# Patient Record
Sex: Female | Born: 1935
Health system: Southern US, Community
[De-identification: ages and names within clinical notes are randomized; demographics above are authoritative.]

## PROBLEM LIST (undated history)

## (undated) DIAGNOSIS — I10 Essential (primary) hypertension: Secondary | ICD-10-CM

## (undated) DIAGNOSIS — K589 Irritable bowel syndrome without diarrhea: Secondary | ICD-10-CM

## (undated) DIAGNOSIS — K209 Esophagitis, unspecified without bleeding: Secondary | ICD-10-CM

## (undated) DIAGNOSIS — F0390 Unspecified dementia without behavioral disturbance: Secondary | ICD-10-CM

## (undated) DIAGNOSIS — K2980 Duodenitis without bleeding: Secondary | ICD-10-CM

## (undated) DIAGNOSIS — K5790 Diverticulosis of intestine, part unspecified, without perforation or abscess without bleeding: Secondary | ICD-10-CM

## (undated) DIAGNOSIS — K219 Gastro-esophageal reflux disease without esophagitis: Secondary | ICD-10-CM

## (undated) DIAGNOSIS — I350 Nonrheumatic aortic (valve) stenosis: Secondary | ICD-10-CM

## (undated) DIAGNOSIS — C55 Malignant neoplasm of uterus, part unspecified: Secondary | ICD-10-CM

## (undated) DIAGNOSIS — R011 Cardiac murmur, unspecified: Secondary | ICD-10-CM

## (undated) HISTORY — DX: Irritable bowel syndrome, unspecified: K58.9

## (undated) HISTORY — DX: Malignant neoplasm of uterus, part unspecified: C55

## (undated) HISTORY — PX: FOOT SURGERY: SHX648

## (undated) HISTORY — DX: Esophagitis, unspecified: K20.9

## (undated) HISTORY — DX: Nonrheumatic aortic (valve) stenosis: I35.0

## (undated) HISTORY — DX: Diverticulosis of intestine, part unspecified, without perforation or abscess without bleeding: K57.90

## (undated) HISTORY — PX: VAGINAL HYSTERECTOMY: SUR661

## (undated) HISTORY — DX: Duodenitis without bleeding: K29.80

## (undated) HISTORY — DX: Esophagitis, unspecified without bleeding: K20.90

---

## 1998-04-26 ENCOUNTER — Encounter: Admission: RE | Admit: 1998-04-26 | Discharge: 1998-07-25 | Payer: Self-pay | Admitting: Family Medicine

## 2000-09-20 ENCOUNTER — Encounter: Payer: Self-pay | Admitting: Gastroenterology

## 2000-09-20 ENCOUNTER — Ambulatory Visit (HOSPITAL_COMMUNITY): Admission: RE | Admit: 2000-09-20 | Discharge: 2000-09-20 | Payer: Self-pay | Admitting: Gastroenterology

## 2001-10-19 ENCOUNTER — Encounter: Payer: Self-pay | Admitting: Family Medicine

## 2001-10-19 ENCOUNTER — Ambulatory Visit (HOSPITAL_COMMUNITY): Admission: RE | Admit: 2001-10-19 | Discharge: 2001-10-19 | Payer: Self-pay | Admitting: Family Medicine

## 2001-12-01 ENCOUNTER — Encounter: Payer: Self-pay | Admitting: Gastroenterology

## 2001-12-01 ENCOUNTER — Ambulatory Visit (HOSPITAL_COMMUNITY): Admission: RE | Admit: 2001-12-01 | Discharge: 2001-12-01 | Payer: Self-pay | Admitting: Gastroenterology

## 2004-07-04 ENCOUNTER — Other Ambulatory Visit: Admission: RE | Admit: 2004-07-04 | Discharge: 2004-07-04 | Payer: Self-pay | Admitting: Family Medicine

## 2005-05-22 ENCOUNTER — Ambulatory Visit: Payer: Self-pay | Admitting: Gastroenterology

## 2005-07-22 ENCOUNTER — Ambulatory Visit: Payer: Self-pay | Admitting: Gastroenterology

## 2005-08-01 ENCOUNTER — Ambulatory Visit: Payer: Self-pay | Admitting: Gastroenterology

## 2005-08-01 ENCOUNTER — Encounter (INDEPENDENT_AMBULATORY_CARE_PROVIDER_SITE_OTHER): Payer: Self-pay | Admitting: *Deleted

## 2005-11-19 ENCOUNTER — Ambulatory Visit: Payer: Self-pay | Admitting: Cardiology

## 2005-12-16 ENCOUNTER — Encounter: Payer: Self-pay | Admitting: Internal Medicine

## 2005-12-16 ENCOUNTER — Ambulatory Visit: Payer: Self-pay

## 2006-10-22 ENCOUNTER — Ambulatory Visit: Payer: Self-pay | Admitting: Gastroenterology

## 2006-10-22 LAB — CONVERTED CEMR LAB
ALT: 14 units/L (ref 0–40)
AST: 18 units/L (ref 0–37)
Albumin: 4 g/dL (ref 3.5–5.2)
Alkaline Phosphatase: 61 units/L (ref 39–117)
BUN: 6 mg/dL (ref 6–23)
Basophils Absolute: 0 10*3/uL (ref 0.0–0.1)
Basophils Relative: 0.1 % (ref 0.0–1.0)
Bilirubin, Direct: 0.2 mg/dL (ref 0.0–0.3)
CO2: 32 meq/L (ref 19–32)
Calcium: 9.3 mg/dL (ref 8.4–10.5)
Chloride: 98 meq/L (ref 96–112)
Creatinine, Ser: 0.7 mg/dL (ref 0.4–1.2)
Eosinophils Absolute: 0 10*3/uL (ref 0.0–0.6)
Eosinophils Relative: 0.4 % (ref 0.0–5.0)
GFR calc Af Amer: 106 mL/min
GFR calc non Af Amer: 88 mL/min
Glucose, Bld: 111 mg/dL — ABNORMAL HIGH (ref 70–99)
HCT: 38 % (ref 36.0–46.0)
Hemoglobin: 13.2 g/dL (ref 12.0–15.0)
Lymphocytes Relative: 16.8 % (ref 12.0–46.0)
MCHC: 34.7 g/dL (ref 30.0–36.0)
MCV: 87.4 fL (ref 78.0–100.0)
Monocytes Absolute: 0.4 10*3/uL (ref 0.2–0.7)
Monocytes Relative: 7.3 % (ref 3.0–11.0)
Neutro Abs: 3.6 10*3/uL (ref 1.4–7.7)
Neutrophils Relative %: 75.4 % (ref 43.0–77.0)
Platelets: 266 10*3/uL (ref 150–400)
Potassium: 3.3 meq/L — ABNORMAL LOW (ref 3.5–5.1)
RBC: 4.35 M/uL (ref 3.87–5.11)
RDW: 12.1 % (ref 11.5–14.6)
Sed Rate: 30 mm/hr — ABNORMAL HIGH (ref 0–25)
Sodium: 137 meq/L (ref 135–145)
TSH: 1.46 microintl units/mL (ref 0.35–5.50)
Total Bilirubin: 0.9 mg/dL (ref 0.3–1.2)
Total Protein: 6.8 g/dL (ref 6.0–8.3)
WBC: 4.8 10*3/uL (ref 4.5–10.5)

## 2006-10-24 ENCOUNTER — Encounter (INDEPENDENT_AMBULATORY_CARE_PROVIDER_SITE_OTHER): Payer: Self-pay | Admitting: Gastroenterology

## 2006-10-24 ENCOUNTER — Ambulatory Visit: Payer: Self-pay | Admitting: Cardiovascular Disease

## 2006-11-05 ENCOUNTER — Ambulatory Visit: Payer: Self-pay | Admitting: Gastroenterology

## 2006-11-05 LAB — CONVERTED CEMR LAB
BUN: 7 mg/dL (ref 6–23)
CO2: 31 meq/L (ref 19–32)
Calcium: 9.2 mg/dL (ref 8.4–10.5)
Chloride: 106 meq/L (ref 96–112)
Creatinine, Ser: 0.6 mg/dL (ref 0.4–1.2)
GFR calc Af Amer: 127 mL/min
GFR calc non Af Amer: 105 mL/min
Glucose, Bld: 122 mg/dL — ABNORMAL HIGH (ref 70–99)
Potassium: 3.7 meq/L (ref 3.5–5.1)
Sodium: 142 meq/L (ref 135–145)

## 2007-01-09 ENCOUNTER — Ambulatory Visit: Payer: Self-pay | Admitting: Cardiology

## 2008-06-22 ENCOUNTER — Ambulatory Visit: Payer: Self-pay | Admitting: Cardiology

## 2008-07-05 ENCOUNTER — Encounter: Payer: Self-pay | Admitting: Cardiology

## 2008-07-05 ENCOUNTER — Ambulatory Visit: Payer: Self-pay

## 2009-02-21 ENCOUNTER — Encounter: Admission: RE | Admit: 2009-02-21 | Discharge: 2009-02-21 | Payer: Self-pay | Admitting: Otolaryngology

## 2009-11-20 ENCOUNTER — Encounter (INDEPENDENT_AMBULATORY_CARE_PROVIDER_SITE_OTHER): Payer: Self-pay | Admitting: *Deleted

## 2009-11-21 ENCOUNTER — Encounter (INDEPENDENT_AMBULATORY_CARE_PROVIDER_SITE_OTHER): Payer: Self-pay | Admitting: *Deleted

## 2009-11-21 ENCOUNTER — Ambulatory Visit: Payer: Self-pay | Admitting: Gastroenterology

## 2009-11-21 DIAGNOSIS — K209 Esophagitis, unspecified without bleeding: Secondary | ICD-10-CM | POA: Insufficient documentation

## 2009-11-21 DIAGNOSIS — K625 Hemorrhage of anus and rectum: Secondary | ICD-10-CM

## 2009-11-22 LAB — CONVERTED CEMR LAB
ALT: 19 units/L (ref 0–35)
AST: 25 units/L (ref 0–37)
Albumin: 3.8 g/dL (ref 3.5–5.2)
Alkaline Phosphatase: 55 units/L (ref 39–117)
BUN: 12 mg/dL (ref 6–23)
Basophils Absolute: 0 10*3/uL (ref 0.0–0.1)
Basophils Relative: 0.4 % (ref 0.0–3.0)
CO2: 31 meq/L (ref 19–32)
Calcium: 9.2 mg/dL (ref 8.4–10.5)
Chloride: 100 meq/L (ref 96–112)
Creatinine, Ser: 0.6 mg/dL (ref 0.4–1.2)
Eosinophils Absolute: 0.1 10*3/uL (ref 0.0–0.7)
Eosinophils Relative: 0.7 % (ref 0.0–5.0)
GFR calc non Af Amer: 98.26 mL/min (ref >60)
Glucose, Bld: 99 mg/dL (ref 70–99)
HCT: 36 % (ref 36.0–46.0)
Hemoglobin: 12.2 g/dL (ref 12.0–15.0)
Lymphocytes Relative: 12.9 % (ref 12.0–46.0)
Lymphs Abs: 0.9 10*3/uL (ref 0.7–4.0)
MCHC: 33.8 g/dL (ref 30.0–36.0)
MCV: 88.1 fL (ref 78.0–100.0)
Monocytes Absolute: 0.4 10*3/uL (ref 0.1–1.0)
Monocytes Relative: 6.5 % (ref 3.0–12.0)
Neutro Abs: 5.4 10*3/uL (ref 1.4–7.7)
Neutrophils Relative %: 79.5 % — ABNORMAL HIGH (ref 43.0–77.0)
Platelets: 260 10*3/uL (ref 150.0–400.0)
Potassium: 3.7 meq/L (ref 3.5–5.1)
RBC: 4.08 M/uL (ref 3.87–5.11)
RDW: 13.5 % (ref 11.5–14.6)
Sodium: 140 meq/L (ref 135–145)
Total Bilirubin: 0.6 mg/dL (ref 0.3–1.2)
Total Protein: 6.2 g/dL (ref 6.0–8.3)
WBC: 6.8 10*3/uL (ref 4.5–10.5)

## 2009-11-23 ENCOUNTER — Telehealth: Payer: Self-pay | Admitting: Cardiology

## 2009-11-24 ENCOUNTER — Ambulatory Visit: Payer: Self-pay | Admitting: Cardiology

## 2009-11-24 DIAGNOSIS — R079 Chest pain, unspecified: Secondary | ICD-10-CM

## 2009-11-24 DIAGNOSIS — I359 Nonrheumatic aortic valve disorder, unspecified: Secondary | ICD-10-CM

## 2009-12-27 ENCOUNTER — Ambulatory Visit: Payer: Self-pay | Admitting: Gastroenterology

## 2009-12-29 ENCOUNTER — Encounter: Payer: Self-pay | Admitting: Gastroenterology

## 2009-12-29 ENCOUNTER — Telehealth: Payer: Self-pay | Admitting: Gastroenterology

## 2010-08-16 NOTE — Procedures (Signed)
Summary: Colonoscopy  Patient: Michelle [name] Note: All result statuses are Final unless otherwise noted.  Tests: (1) Colonoscopy (COL)   COL Colonoscopy           DONE     [facility]     [address]     [place], [place]  [address]           COLONOSCOPY PROCEDURE REPORT           PATIENT:  Michelle, [name]  MR#:  [id number]     BIRTHDATE:  [birth year], 75 yrs. old  GENDER:  female     ENDOSCOPIST:  Rachael Fee, MD     REF. BY:  Rudi Heap, M.D.     PROCEDURE DATE:  12/27/2009     PROCEDURE:  Colonoscopy with biopsy     ASA CLASS:  Class II     INDICATIONS:  diarrhea, intermittent rectal bleeding     MEDICATIONS:   Fentanyl 62.5 mcg IV, Versed 6 mg IV           DESCRIPTION OF PROCEDURE:   After the risks benefits and     alternatives of the procedure were thoroughly explained, informed     consent was obtained.  Digital rectal exam was performed and     revealed no rectal masses.   The LB [id number]-[id number] endoscope     was introduced through the anus and advanced to the terminal ileum     which was intubated for a short distance, without limitations.     The quality of the prep was good, using MoviPrep.  The instrument     was then slowly withdrawn as the colon was fully examined.     <<PROCEDUREIMAGES>>           FINDINGS:  Mild diverticulosis was found in the sigmoid to     descending colon segments (see image1 and image2).  The terminal     ileum appeared normal (see image6).  Internal and external     hemorrhoids were found. These were small, not thrombosed.  This     was otherwise a normal examination of the colon (see image5,     image4, and image8).   Random biopsies were taken from normal     appearing colon mucosa and sent to pathology (jar 1).  Retroflexed     views in the rectum revealed no abnormalities.    The scope was     then withdrawn from the patient and the procedure completed.           COMPLICATIONS:  None     ENDOSCOPIC IMPRESSION:  1) Mild diverticulosis in the sigmoid to descending colon     segments     2) Normal terminal ileum; no inflammtion     3) Internal and external hemorrhoids     4) Otherwise normal examination, colon was randomly biopsied to     check for microscopic colitis that can cause diarrhea           RECOMMENDATIONS:     1) Continue current colorectal screening recommendations for     "routine risk" patients with a repeat colonoscopy in 10 years.     2) Await biopsies for final recommendations.           REPEAT EXAM:  10 years           ______________________________     Rachael Fee, MD  n.     eSIGNED:   Rachael Fee at 12/27/2009 10:54 AM           Hilarie Fredrickson, [id number]  Note: An exclamation mark (!) indicates a result that was not dispersed into the flowsheet. Document Creation Date: 12/27/2009 10:55 AM _______________________________________________________________________  (1) Order result status: Final Collection or observation date-time: 12/27/2009 10:45 Requested date-time:  Receipt date-time:  Reported date-time:  Referring Physician:   Ordering Physician: Rob Bunting [phone]) Specimen Source:  Source: Launa Grill Order Number: [id number] Lab site:   Appended Document: Colonoscopy     Procedures Next Due Date:    Colonoscopy: 12/2019

## 2010-08-16 NOTE — Letter (Signed)
Summary: New Patient letter  [facility]  [address] [place], [place] [address]   Phone: [phone]  Fax: [phone]       11/20/2009 MRN: [id number]  [facility] [address] [place], [place]  [address]  Dear Ms. Dresser,  Welcome to the Gastroenterology Division at [facility].    You are scheduled to see Dr. Christella Hartigan on 11/21/2009 at 1:30PM on the 3rd floor at [facility], [address]  We ask that you try to arrive at our office 15 minutes prior to your appointment time to allow for check-in.  We would like you to complete the enclosed self-administered evaluation form prior to your visit and bring it with you on the day of your appointment.  We will review it with you.  Also, please bring a complete list of all your medications or, if you prefer, bring the medication bottles and we will list them.  Please bring your insurance card so that we may make a copy of it.  If your insurance requires a referral to see a specialist, please bring your referral form from your primary care physician.  Co-payments are due at the time of your visit and may be paid by cash, check or credit card.     Your office visit will consist of a consult with your physician (includes a physical exam), any laboratory testing he/she may order, scheduling of any necessary diagnostic testing (e.g. x-ray, ultrasound, CT-scan), and scheduling of a procedure (e.g. Endoscopy, Colonoscopy) if required.  Please allow enough time on your schedule to allow for any/all of these possibilities.    If you cannot keep your appointment, please call [phone] to cancel or reschedule prior to your appointment date.  This allows [place] the opportunity to schedule an appointment for another patient in need of care.  If you do not cancel or reschedule by 5 p.m. the business day prior to your appointment date, you will be charged a $50.00 late cancellation/no-show fee.    Thank you for choosing [facility] for your medical needs.  We appreciate the opportunity to care for you.  Please visit [place] at our website  to learn more about our practice.                     Sincerely,                                                             The Gastroenterology Division

## 2010-08-16 NOTE — Procedures (Signed)
Summary: colon   Colonoscopy  Procedure date:  08/01/2005  Findings:      Location:  [facility].   Patient Name: Michelle [name], Michelle [name]. MRN:  Procedure Procedures: Colonoscopy CPT: [phone].  Personnel: Endoscopist: Ulyess Mort, MD.  Exam Location: Exam performed in Outpatient Clinic. Outpatient  Patient Consent: Procedure, Alternatives, Risks and Benefits discussed, consent obtained, from patient. Consent was obtained by the RN.  Indications Symptoms: Abdominal pain / bloating. Change in bowel habits.  Surveillance of: Adenomatous Polyp(s).  History  Current Medications: Patient is not currently taking Coumadin.  Pre-Exam Physical: Entire physical exam was normal.  Comments: Pt. history reviewed/updated, physical exam performed prior to initiation of sedation?yes Exam Exam: Extent of exam reached: Cecum, extent intended: Cecum.  The cecum was identified by appendiceal orifice and IC valve. Colon retroflexion performed. Images taken. ASA Classification: II. Tolerance: excellent.  Monitoring: Pulse and BP monitoring, Oximetry used. Supplemental O2 given.  Colon Prep Prep results: good.  Sedation Meds: Patient assessed and found to be appropriate for moderate (conscious) sedation. Fentanyl 50 mcg. given IV. Versed 8 mg. given IV.  Findings - DIVERTICULOSIS: Descending Colon to Sigmoid Colon. ICD9: Diverticulosis: 562.10.  - NOT SEEN ON EXAM: Cecum to Rectum. Polyps, AVM's, Colitis, Tumors, Melanosis, Crohn's, Hemorrhoids, Comments: mild l colon diverticulosis.   Assessment Abnormal examination, see findings above.  Diagnoses: 562.10: Diverticulosis.   Events  Unplanned Interventions: No intervention was required.  Unplanned Events: There were no complications. Plans Medication Plan: Continue current medications.  Patient Education: Patient given standard instructions for: Diverticulosis. Yearly hemoccult testing recommended. Patient  instructed to get routine colonoscopy every 8 years.  Disposition: After procedure patient sent to recovery. After recovery patient sent home.  This report was created from the original endoscopy report, which was reviewed and signed by the above listed endoscopist.

## 2010-08-16 NOTE — Progress Notes (Signed)
Summary: req appt/sob  Phone Note Call from Patient Call back at Home Phone [phone]   Caller: Patient Reason for Call: Talk to Nurse Summary of Call: req appt asap, having trouble breathing, sometimes she has chest pain and pain when swallowing, offer 1st avaliable appt 6/28 and she refused Initial call taken by: Migdalia Dk,  Nov 23, 2009 11:45 AM  Follow-up for Phone Call        having quick sharp pains to the left of center of chest under her breast.  also c/o SOB - like it's hard to get her breathe.  Requesting to be seen before 6/28 (next available)  Pt added on to schedule for 5/13 but told to go to ED for eval if pain reoccurs.  Pt states understanding. Follow-up by: Charolotte Capuchin, RN,  Nov 23, 2009 5:47 PM

## 2010-08-16 NOTE — Procedures (Signed)
Summary: Upper Endoscopy  Patient: [name] Note: All result statuses are Final unless otherwise noted.  Tests: (1) Upper Endoscopy (EGD)   EGD Upper Endoscopy       DONE     [facility]     [address]     [place], [place]  [address]           ENDOSCOPY PROCEDURE REPORT     PATIENT:  Michelle, [name]  MR#:  [id number]     BIRTHDATE:  [birth year], 75 yrs. old  GENDER:  female     ENDOSCOPIST:  Rachael Fee, MD     PROCEDURE DATE:  12/27/2009     PROCEDURE:  EGD with biopsy     ASA CLASS:  Class II     INDICATIONS:  dyspepsia     MEDICATIONS:  There was residual sedation effect present from     prior procedure., Fentanyl 12.5 mcg IV, Versed 1 mg IV     TOPICAL ANESTHETIC:  Exactacain Spray     DESCRIPTION OF PROCEDURE:   After the risks benefits and     alternatives of the procedure were thoroughly explained, informed     consent was obtained.  The [facility] GIF-H180 [id number] endoscope was     introduced through the mouth and advanced to the second portion of     the duodenum, without limitations.  The instrument was slowly     withdrawn as the mucosa was fully examined.     <<PROCEDUREIMAGES>>     There was moderate, non-specific pangastritis. This was biopsied     to check for H. pylori (see image2, image5, and image6) and sent     to pathology (jar 2).  Otherwise the examination was normal (see     image4, image3, and image1).    Retroflexed views revealed no     abnormalities.    The scope was then withdrawn from the patient     and the procedure completed.     COMPLICATIONS:  None     ENDOSCOPIC IMPRESSION:     1) Moderate pan-gastritis; biopsied to check for H. pylori     2) Otherwise normal examination           RECOMMENDATIONS:     If biopsies show H. pylori, she will be started on the     appropriate antibiotics.  If not, will change PPI (protonix) to     twice daily.           ______________________________     Rachael Fee, MD           cc: Rudi Heap, MD           n.     Rosalie Doctor:   Rachael Fee at 12/27/2009 11:00 AM           Hilarie Fredrickson, [id number]  Note: An exclamation mark (!) indicates a result that was not dispersed into the flowsheet. Document Creation Date: 12/27/2009 11:01 AM _______________________________________________________________________  (1) Order result status: Final Collection or observation date-time: 12/27/2009 10:52 Requested date-time:  Receipt date-time:  Reported date-time:  Referring Physician:   Ordering Physician: Rob Bunting [phone]) Specimen Source:  Source: Launa Grill Order Number: [id number] Lab site:

## 2010-08-16 NOTE — Letter (Signed)
Summary: Results Letter  [facility]  [address] [place], [place] [address]   Phone: [phone]  Fax: [phone]        December 29, 2009 MRN: [id number]    [facility] [address] [place], [place]  [address]    Dear Ms. Horsfall,   You should continue to follow current colorectal cancer screening guidelines with a repeat colonoscopy in 10 years.  We will therefore put your information in our reminder system and will contact you in 10 years to schedule a repeat procedure.    The biopsies taken during the upper endoscopy showed no sign of infection or cancer.  Please increase your antiacid medicine to twice daily (prilosec should be twice daily).  Call my office if you need a new prescription for this to get it twice daily.  If diarrhea is still bothersome, start taking one immodium pill every morning shortly after you wake up and only stop if you become constipated.  Please feel free to call if you have any further questions or concerns.      Sincerely,  Rachael Fee MD  This letter has been electronically signed by your physician.  Appended Document: Results Letter letter mailed.

## 2010-08-16 NOTE — Progress Notes (Signed)
Summary: triage  Phone Note Call from Patient Call back at Home Phone [phone]   Caller: Patient Call For: Christella Hartigan Reason for Call: Talk to Nurse Summary of Call: pt wants to know her bx results and if she does not have a bacteria can she change to protonix. Initial call taken by: Tawni Levy,  December 29, 2009 1:20 PM  Follow-up for Phone Call        per egd report is no h pylori Dr. Christella Hartigan says the pt can change PPIS to protonix two times a day I advised her I will send her a new rx for her to change from prilosec to protonix two times a day  Follow-up by: Harlow Mares CMA (AAMA),  December 29, 2009 2:18 PM    New/Updated Medications: PANTOPRAZOLE SODIUM 40 MG TBEC (PANTOPRAZOLE SODIUM) take one by mouth 30 min before breakfast and one by mouth 30 min before supper. Prescriptions: PANTOPRAZOLE SODIUM 40 MG TBEC (PANTOPRAZOLE SODIUM) take one by mouth 30 min before breakfast and one by mouth 30 min before supper.  #60 x 6   Entered by:   Harlow Mares CMA (AAMA)   Authorized by:   Rachael Fee MD   Signed by:   Harlow Mares CMA (AAMA) on 12/29/2009   Method used:   Electronically to        [facility]  [address] (retail)       [address]       [place], [place]  [address]       Ph: [phone]       Fax: [phone]   RxID:   [id number]

## 2010-08-16 NOTE — Assessment & Plan Note (Signed)
History of Present Illness Visit Type: Follow-up Visit Primary GI MD: Rob Bunting MD Primary Provider: Rudi Heap MD Chief Complaint: diarrhea, hemorhoids, rectal bleeding History of Present Illness:     pleasant 77 year old woman who was previously seen by Dr. Doreatha Martin, last colonoscopy and upper endoscopy was in 2007.   Recently had diarrhrea, lasted for about 3 weeks.  NOrmally she is constipated.  She usually eats alot of fiber, however modified her diet to avoid fiber.  Then became constipated.  She had bleeding rectally 2 days ago ("gobs of blood").  Then took a stool softner.   She has never had dirrhea like this before.  She has lost weight lately, 30 pounds over 3 years.  No sick contacts.  Takes mobic daily.  Does not take PPI daily.  Stopped because it was too expensive.  Was recommended to take omeprazole.  previously Dr. Corinda Gubler documented that she had adenomatous colon polyps however I find no pathologic evidence for this. The only polyp pathology that I see was 15 years ago and it was hyperplastic.  She has had dyspepsia, indigestion.           Current Medications (verified): 1)  Protonix 40 Mg Tbec (Pantoprazole Sodium) .Marland Kitchen.. 1 By Mouth Once Daily 2)  Altace 10 Mg Caps (Ramipril) .Marland Kitchen.. 1 By Mouth Once Daily 3)  Hydrochlorothiazide 25 Mg Tabs (Hydrochlorothiazide) .Marland Kitchen.. 1 By Mouth Once Daily 4)  Vytorin 10-20 Mg Tabs (Ezetimibe-Simvastatin) .Marland Kitchen.. 1 By Mouth Once Daily 5)  Vitamin D (Ergocalciferol) 50000 Unit Caps (Ergocalciferol) .Marland Kitchen.. 1 Per Week 6)  Zyrtec Hives Relief 10 Mg Tabs (Cetirizine Hcl) .Marland Kitchen.. 1 By Mouth Once Daily 7)  Mobic 7.5 Mg Tabs (Meloxicam) .... As Needed 8)  Flonase 50 Mcg/act Susp (Fluticasone Propionate) .... As Needed 9)  Metamucil 30.9 % Powd (Psyllium) .... As Needed 10)  Benefiber  Powd (Wheat Dextrin) .... As Directed 11)  Colace 100 Mg Caps (Docusate Sodium) .... As Needed  Allergies (verified): 1)  ! Sulfa 2)  ! Macrobid  Past  History:  Past Medical History: Current Problems:  DUODENITIS WITHOUT MENTION OF HEMORRHAGE (ICD-535.60) ESOPHAGITIS (ICD-530.10) Diverticulosis heart murmur mild arotic stenosis Urinary Tract Infection Irritable Bowel Syndrome uterine cancer   Past Surgical History: Hysterectomy    Family History: no colon cancer  Social History: She is married, she has 3 children, she is retired, she does not smoke cigarettes or drink alcohol  Review of Systems       Pertinent positive and negative review of systems were noted in the above HPI and GI specific review of systems.  All other review of systems was otherwise negative.   Vital Signs:  Patient profile:   77 year old female Height:      67 inches Weight:      179 pounds BMI:     28.14 Pulse rate:   72 / minute Pulse rhythm:   regular BP sitting:   146 / 62  (left arm)  Vitals Entered By: Chales Abrahams CMA Duncan Dull) (Nov 21, 2009 1:13 PM)  Physical Exam  Additional Exam:  Constitutional: generally well appearing Psychiatric: alert and oriented times 3 Eyes: extraocular movements intact Mouth: oropharynx moist, no lesions Neck: supple, no lymphadenopathy Cardiovascular: heart regular rate and rythm Lungs: CTA bilaterally Abdomen: soft, non-tender, non-distended, no obvious ascites, no peritoneal signs, normal bowel sounds Extremities: no lower extremity edema bilaterally Skin: no lesions on visible extremities rectal exam: with female assistant in room, small to medium sized  external hemorrhoids, no internal masses palpated, no blood on finger.   Impression & Recommendations:  Problem # 1:  recent change in her bowel habits, history of polyps that we should proceed with repeat colonoscopy. She does have some hemorrhoids that appear swollen and probably a source of her recent bleeding. I have given her prescription for Analpram ointment which she can apply once daily as needed.  Problem # 2:  dyspepsia, indigestion EGD  at the same time as her colonoscopy  Other Orders: TLB-CBC Platelet - w/Differential (85025-CBCD) TLB-CMP (Comprehensive Metabolic Pnl) (80053-COMP)  Patient Instructions: 1)  You will be scheduled to have a colonoscopy and EGD. 2)  A copy of this information will be sent to Dr. Christell Constant. 3)  You will get lab test(s) done today (cbc, cmet) 4)  Analpram prescription called in. Prescriptions: ANALPRAM-HC 1-1 %  CREA (HYDROCORTISONE ACE-PRAMOXINE) apply to anus once daily as needed  #30 day x 3   Entered by:   Chales Abrahams CMA (AAMA)   Authorized by:   Rachael Fee MD   Signed by:   Chales Abrahams CMA (AAMA) on 11/21/2009   Method used:   Print then Give to Patient   RxID:   [id number]   Appended Document: Orders Update/movi    Clinical Lists Changes  Medications: Added new medication of MOVIPREP 100 GM  SOLR (PEG-KCL-NACL-NASULF-NA ASC-C) As per prep instructions. - Signed Rx of MOVIPREP 100 GM  SOLR (PEG-KCL-NACL-NASULF-NA ASC-C) As per prep instructions.;  #1 x 0;  Signed;  Entered by: Chales Abrahams CMA (AAMA);  Authorized by: Rachael Fee MD;  Method used: Print then Give to Patient Orders: Added new Test order of Colon/Endo (Colon/Endo) - Signed    Prescriptions: MOVIPREP 100 GM  SOLR (PEG-KCL-NACL-NASULF-NA ASC-C) As per prep instructions.  #1 x 0   Entered by:   Chales Abrahams CMA (AAMA)   Authorized by:   Rachael Fee MD   Signed by:   Chales Abrahams CMA (AAMA) on 11/21/2009   Method used:   Print then Give to Patient   RxID:   [id number]

## 2010-08-16 NOTE — Letter (Signed)
Summary: [facility] Instructions  [facility]  [address] [place], [place] [address]   Phone: [phone]  Fax: [phone]       Michelle [name]    09-10-1933    MRN: [id number]        Procedure Day /Date:12/05/09 TUE     Arrival Time:1 pm     Procedure Time:2 pm     Location of Procedure:                    X  [facility] (4th Floor)                        PREPARATION FOR COLONOSCOPY WITH MOVIPREP   Starting 5 days prior to your procedure 11/30/09 do not eat nuts, seeds, popcorn, corn, beans, peas,  salads, or any raw vegetables.  Do not take any fiber supplements (e.g. Metamucil, Citrucel, and Benefiber).  THE DAY BEFORE YOUR PROCEDURE         DATE: 12/04/09 DAY: MON  1.  Drink clear liquids the entire day-NO SOLID FOOD  2.  Do not drink anything colored red or purple.  Avoid juices with pulp.  No orange juice.  3.  Drink at least 64 oz. (8 glasses) of fluid/clear liquids during the day to prevent dehydration and help the prep work efficiently.  CLEAR LIQUIDS INCLUDE: Water Jello Ice Popsicles Tea (sugar ok, no milk/cream) Powdered fruit flavored drinks Coffee (sugar ok, no milk/cream) Gatorade Juice: apple, white grape, white cranberry  Lemonade Clear bullion, consomm, broth Carbonated beverages (any kind) Strained chicken noodle soup Hard Candy                             4.  In the morning, mix first dose of MoviPrep solution:    Empty 1 Pouch A and 1 Pouch B into the disposable container    Add lukewarm drinking water to the top line of the container. Mix to dissolve    Refrigerate (mixed solution should be used within 24 hrs)  5.  Begin drinking the prep at 5:00 p.m. The MoviPrep container is divided by 4 marks.   Every 15 minutes drink the solution down to the next mark (approximately 8 oz) until the full liter is complete.   6.  Follow completed prep with 16 oz of clear liquid of your choice (Nothing red or purple).   Continue to drink clear liquids until bedtime.  7.  Before going to bed, mix second dose of MoviPrep solution:    Empty 1 Pouch A and 1 Pouch B into the disposable container    Add lukewarm drinking water to the top line of the container. Mix to dissolve    Refrigerate  THE DAY OF YOUR PROCEDURE      DATE:12/05/09 DAY: TUE  Beginning at 9 a.m. (5 hours before procedure):         1. Every 15 minutes, drink the solution down to the next mark (approx 8 oz) until the full liter is complete.  2. Follow completed prep with 16 oz. of clear liquid of your choice.    3. You may drink clear liquids until 12 noon (2 HOURS BEFORE PROCEDURE).   MEDICATION INSTRUCTIONS  Unless otherwise instructed, you should take regular prescription medications with a small sip of water   as early as possible the morning of your procedure.  OTHER INSTRUCTIONS  You will need a responsible adult at least 75 years of age to accompany you and drive you home.   This person must remain in the waiting room during your procedure.  Wear loose fitting clothing that is easily removed.  Leave jewelry and other valuables at home.  However, you may wish to bring a book to read or  an iPod/MP3 player to listen to music as you wait for your procedure to start.  Remove all body piercing jewelry and leave at home.  Total time from sign-in until discharge is approximately 2-3 hours.  You should go home directly after your procedure and rest.  You can resume normal activities the  day after your procedure.  The day of your procedure you should not:   Drive   Make legal decisions   Operate machinery   Drink alcohol   Return to work  You will receive specific instructions about eating, activities and medications before you leave.    The above instructions have been reviewed and explained to me by   _______________________    I fully understand and can verbalize these instructions  _____________________________ Date _________

## 2010-08-16 NOTE — Assessment & Plan Note (Signed)
Summary: chest pain/SOB appt at 2  pfh,rn   Visit Type:  Follow-up Primary Provider:  Rudi Heap MD  CC:  chest pain.  History of Present Illness: The patient presents for evaluation of her mild aortic stenosis. She has multiple complaints including loss of GI disturbance with borborygmi, flatus and abdominal discomfort. She does get discomfort in her upper epigastric area that goes under her left breast. She does not describe any sternal discomfort neck or jaw discomfort. It happens at rest. She doesn't sound like she is overly active but when she does exert herself she does not report bringing this on. She denies resting shortness of breath, PND or orthopnea. She sometimes feels like she has trouble "taking a deep breath". She's had no weight gain or swelling. Her review of systems is positive as stated below.  Current Medications (verified): 1)  Prilosec 20 Mg Cpdr (Omeprazole) .Marland Kitchen.. 1` By Mouth Daily 2)  Altace 10 Mg Caps (Ramipril) .Marland Kitchen.. 1 By Mouth Once Daily 3)  Hydrochlorothiazide 25 Mg Tabs (Hydrochlorothiazide) .Marland Kitchen.. 1 By Mouth Once Daily 4)  Vytorin 10-20 Mg Tabs (Ezetimibe-Simvastatin) .Marland Kitchen.. 1 By Mouth Once Daily 5)  Vitamin D (Ergocalciferol) 50000 Unit Caps (Ergocalciferol) .Marland Kitchen.. 1 Per Week 6)  Zyrtec Hives Relief 10 Mg Tabs (Cetirizine Hcl) .Marland Kitchen.. 1 By Mouth Once Daily 7)  Mobic 7.5 Mg Tabs (Meloxicam) .... As Needed 8)  Flonase 50 Mcg/act Susp (Fluticasone Propionate) .... As Needed 9)  Metamucil 30.9 % Powd (Psyllium) .... As Needed 10)  Benefiber  Powd (Wheat Dextrin) .... As Directed 11)  Colace 100 Mg Caps (Docusate Sodium) .... As Needed 12)  Analpram-Hc 1-1 %  Crea (Hydrocortisone Ace-Pramoxine) .... Apply To Anus Once Daily As Needed 13)  Moviprep 100 Gm  Solr (Peg-Kcl-Nacl-Nasulf-Na Asc-C) .... As Per Prep Instructions. 14)  Align  Caps (Probiotic Product) .Marland Kitchen.. 1 By Mouth Daily  Allergies (verified): 1)  ! Sulfa 2)  ! Macrobid  Past History:  Past Medical  History: DUODENITIS WITHOUT MENTION OF HEMORRHAGE (ICD-535.60) ESOPHAGITIS (ICD-530.10) Diverticulosis Mild arotic stenosis Urinary Tract Infection Irritable Bowel Syndrome Uterine cancer   Review of Systems       Difficulty sleeping, diarrhea, left leg pain, dizziness, drooling, sense of smell and burn leaves, weakness. Otherwise as stated in the history of present illness negative for other systems.  Vital Signs:  Patient profile:   77 year old female Height:      67 inches Weight:      175 pounds BMI:     27.51 Pulse rate:   78 / minute Resp:     16 per minute BP sitting:   142 / 74  (right arm)  Vitals Entered By: Marrion Coy, CNA (Nov 24, 2009 1:46 PM)  Physical Exam  General:  Well developed, well nourished, in no acute distress. Head:  normocephalic and atraumatic Eyes:  PERRLA/EOM intact; conjunctiva and lids normal. Mouth:  Teeth, gums and palate normal. Oral mucosa normal. Neck:  Neck supple, no JVD. No masses, thyromegaly or abnormal cervical nodes. Chest Wall:  no deformities or breast masses noted Lungs:  Clear bilaterally to auscultation and percussion. Abdomen:  Bowel sounds positive; abdomen soft and non-tender without masses, organomegaly, or hernias noted. No hepatosplenomegaly. Msk:  Back normal, normal gait. Muscle strength and tone normal. Extremities:  No clubbing or cyanosis. Neurologic:  Alert and oriented x 3. Skin:  Intact without lesions or rashes. Cervical Nodes:  no significant adenopathy Axillary Nodes:  no significant adenopathy Inguinal Nodes:  no significant adenopathy Psych:  Normal affect.   Detailed Cardiovascular Exam  Neck    Carotids: Carotids full and equal bilaterally without bruits.      Neck Veins: Normal, no JVD.    Heart    Inspection: no deformities or lifts noted.      Palpation: normal PMI with no thrills palpable.      Auscultation: regular rate and rhythm, S1, S2 without murmurs, rubs, gallops, or clicks.     Vascular    Abdominal Aorta: no palpable masses, pulsations, or audible bruits.      Femoral Pulses: normal femoral pulses bilaterally.      Pedal Pulses: normal pedal pulses bilaterally.      Radial Pulses: normal radial pulses bilaterally.      Peripheral Circulation: no clubbing, cyanosis, or edema noted with normal capillary refill.     EKG  Procedure date:  11/24/2009  Findings:      sinus rhythm, rate 78, left axis deviation, poor anterior R-wave progression, no acute ST-T wave changes.  Impression & Recommendations:  Problem # 1:  AORTIC VALVE DISORDERS (ICD-424.1) Her aortic stenosis is mild. I would not suggest that it has changed by clinical exam or contributing to symptoms. No change in therapy is evaluated and no further echoes though I will most likely repeat one in one year.  Problem # 2:  CHEST PAIN (ICD-786.50) This is somewhat atypical. However, final treadmill testing is indicated. She wants to defer this until her husband has knee surgery. She will call back to let us know when and where she wants to have this done.  Problem # 3:  DIARRHEA (ICD-787.91) I suspect most of her problems her GI. She is following up with Dr. Christella Hartigan.  Patient Instructions: 1)  Your physician recommends that you schedule a follow-up appointment in: 12 months 2)  Your physician has requested that you have an exercise tolerance test.  For further information please visit [email or web address]  Please also follow instruction sheet, as given. Call us when you want to schedule  Appended Document: chest pain/SOB appt at 2  pfh,rn 2/6 apical systolic murmur heard radiating at the aortic outflow tract and early peaking  Appended Document: chest pain/SOB appt at 2  pfh,rn Per Dr. Antoine Poche pt needs order for Ambien 5 mg by mouth as needed for sleep #30. No refills. Pt would like this called to [facility] [phone]. I called pharmacy but it is closed. I spoke with pt and told her I would  call prescription in on Monday AM.   Appended Document: chest pain/SOB appt at 2  pfh,rn prescription called to pharmacy

## 2010-08-16 NOTE — Procedures (Signed)
Summary: EGD   EGD  Procedure date:  08/01/2005  Findings:      Location: [facility]   Patient Name: Michelle [name], Michelle [name]. MRN:  Procedure Procedures: Panendoscopy (EGD) CPT: 43235.  Personnel: Endoscopist: Ulyess Mort, MD.  Exam Location: Exam performed in Outpatient Clinic. Outpatient  Patient Consent: Procedure, Alternatives, Risks and Benefits discussed, consent obtained, from patient. Consent was obtained by the RN.  Indications Symptoms: Dysphagia. Dyspepsia, Reflux symptoms  History  Current Medications: Patient is not currently taking Coumadin.  Pre-Exam Physical: Entire physical exam was normal.  Comments: Pt. history reviewed/updated, physical exam performed prior to initiation of sedation? Exam Exam Info: Maximum depth of insertion Duodenum, intended Duodenum. Patient position: on left side. Vocal cords visualized. Gastric retroflexion performed. Images taken. ASA Classification: II. Tolerance: good.  Sedation Meds: Patient assessed and found to be appropriate for moderate (conscious) sedation. Fentanyl given IV. Versed given IV. Cetacaine Spray given aerosolized.  Monitoring: BP and pulse monitoring done. Oximetry used. Supplemental O2 given  Findings - ESOPHAGEAL INFLAMMATION: suspected as a result of reflux. Severity is mild, erythema only.  Edema present. ICD9: Esophagitis: 530.10. Comments: no sign. hernia seen.  - MUCOSAL ABNORMALITY: Duodenal Bulb to Jejunum. Granular mucosa. Biopsy/Mucosal Abn taken. ICD9: Duodenitis without Hemorrhage: 535. 60.  - MUCOSAL ABNORMALITY: Body to Antrum. Erythematous mucosa. Granular mucosa. RUT done, results pending.   Assessment Abnormal examination, see findings above.  Diagnoses: 530.10: Esophagitis.  535.60: Duodenitis without Hemorrhage.   Events  Unplanned Intervention: No unplanned interventions were required.  Unplanned Events: There were no  complications. Plans Medication(s): Await pathology. Continue current medications. PPI:  Promotility:   Patient Education: Patient given standard instructions for: Hiatal Hernia. Mucosal Abnormality.  Disposition: After procedure patient sent to recovery. After recovery patient sent home.  This report was created from the original endoscopy report, which was reviewed and signed by the above listed endoscopist.

## 2010-09-28 ENCOUNTER — Other Ambulatory Visit: Payer: Self-pay | Admitting: Family Medicine

## 2010-09-28 DIAGNOSIS — R1013 Epigastric pain: Secondary | ICD-10-CM

## 2010-10-02 ENCOUNTER — Other Ambulatory Visit (HOSPITAL_COMMUNITY): Payer: Self-pay

## 2010-10-02 ENCOUNTER — Ambulatory Visit (HOSPITAL_COMMUNITY)
Admission: RE | Admit: 2010-10-02 | Discharge: 2010-10-02 | Disposition: A | Discharge status: Home / Self Care | Payer: Medicare Other | Source: Ambulatory Visit | Attending: Family Medicine | Admitting: Family Medicine

## 2010-10-02 ENCOUNTER — Other Ambulatory Visit: Payer: Self-pay | Admitting: Family Medicine

## 2010-10-02 DIAGNOSIS — R109 Unspecified abdominal pain: Secondary | ICD-10-CM | POA: Insufficient documentation

## 2010-10-02 DIAGNOSIS — K5730 Diverticulosis of large intestine without perforation or abscess without bleeding: Secondary | ICD-10-CM | POA: Insufficient documentation

## 2010-10-02 DIAGNOSIS — R1013 Epigastric pain: Secondary | ICD-10-CM | POA: Insufficient documentation

## 2010-10-02 MED ORDER — IOHEXOL 300 MG/ML  SOLN: 100.0000 mL | Freq: Once | INTRAMUSCULAR | Status: AC | PRN

## 2010-10-02 MED ORDER — IOHEXOL 300 MG/ML  SOLN
100.0000 mL | Freq: Once | INTRAMUSCULAR | Status: AC | PRN
  Administered 2010-10-02: 100 mL via INTRAVENOUS

## 2010-10-11 ENCOUNTER — Telehealth: Payer: Self-pay | Admitting: Gastroenterology

## 2010-10-11 NOTE — Telephone Encounter (Signed)
Pt has been having diarrhea, and  pain in the upper abd and a cramping pain in the lower abd  since stopping prednisone on 09/19/10. She saw Paulene Floor NP on 09/26/10,  had CT scan on 10/02/10 that showed diverticulosis otherwise normal.  She was put on Dexilant by Paulene Floor on 09/26/10 and says it has not helped.  She states she gets weak and nausea after eating and then has diarrhea shortly after.  Pt wants to be seen ASAP I advised the pt that Dr Christella Hartigan has no openings until 11/20/10.  I will send this to Dr Christella Hartigan for review.

## 2010-10-12 ENCOUNTER — Telehealth: Payer: Self-pay | Admitting: Gastroenterology

## 2010-10-12 NOTE — Telephone Encounter (Signed)
Similar complaints to one year ago, colo/egd done at that point.  She should begin taking immodium one pill twice daily on a scheduled basis and rov at scheduled time

## 2010-10-12 NOTE — Telephone Encounter (Signed)
Pt advised to avoid anything that causes her discomfort. Pt agrees

## 2010-10-12 NOTE — Telephone Encounter (Signed)
Pt aware of Dr Christella Hartigan recommendations and ROV scheduled.  She will call if she worsens or symptoms change.

## 2010-11-20 ENCOUNTER — Ambulatory Visit (INDEPENDENT_AMBULATORY_CARE_PROVIDER_SITE_OTHER): Payer: Medicare Other | Admitting: Gastroenterology

## 2010-11-20 ENCOUNTER — Other Ambulatory Visit: Payer: Medicare Other

## 2010-11-20 ENCOUNTER — Telehealth: Payer: Self-pay | Admitting: Gastroenterology

## 2010-11-20 ENCOUNTER — Encounter: Payer: Self-pay | Admitting: Gastroenterology

## 2010-11-20 DIAGNOSIS — R1013 Epigastric pain: Secondary | ICD-10-CM

## 2010-11-20 DIAGNOSIS — K3189 Other diseases of stomach and duodenum: Secondary | ICD-10-CM

## 2010-11-20 DIAGNOSIS — K5900 Constipation, unspecified: Secondary | ICD-10-CM

## 2010-11-20 NOTE — Patient Instructions (Signed)
You will have labs checked today in the basement lab.  Please head down after you check out with the front desk  (stool studies for c. Difficile) You will be set up for a gastric emptying scan to see if your stomach empties normally. A copy of this information will be made available to Dr. Vernon Prey.

## 2010-11-20 NOTE — Progress Notes (Signed)
Review of pertinent gastrointestinal problems: 1. Routine risk for colon cancer; colonoscopy 2011 (Dr. Christella Hartigan) found no polyps, next screening colonoscopy at 10 year interval 2. Loose stools: colonoscopy, look in terminal ileum 2011 found no cause, random colon biopsies showed no microscopic colitis 3. Moderate, pan-gastritis on EGD 2011, biopsies showed no H. Pylori  HPI: This is a pleasant 77 year old woman whom I last saw about a year ago at the time of upper and lower endoscopies.  Was put on protonix after EGD last year.  Started having diarrhea 2-3 months ago, loose stools 3-4 times a day.  She was on anitiobitcs in Jan, February for ear problems.  She had problems with diarrhea for about 2-3 months, she called here and we advised her to start immodium one pill twice a day.  She has been constipated for 2 weeks.  Has had to strain a lot.  She stopped immodium about 2 weeks ago.  She takes benefiber in AM, metamucil in evening (has been her pattern for years). Will occasionally take colace.  She drinks a lot of fluids.  She was ordered a CT scan for the diarreha and abd pains in March, this showed no clear cause, +diverticulosis.  Was not given any stool testing.  She is weak, was started on B12 shots (tells me the level was low).     Physical Exam: BP 124/72  Pulse 64  Ht 5\' 8"  (1.727 m)  Wt 159 lb (72.122 kg)  BMI 24.18 kg/m2 Constitutional: generally well-appearing Psychiatric: alert and oriented x3 Abdomen: soft, nontender, nondistended, no obvious ascites, no peritoneal signs, normal bowel sounds    Assessment and plan: 77 y.o. female with recent diarrheal illness, for the past 2 weeks has been constipated  Her loose, diarrheal illness started about a month after being on antibiotics for ear problems. She might have had Clostridium difficile. She had a CT scan at some points during workup and this was essentially normal except for showing diverticulosis without obvious  infection. She called here and we recommended Imodium. She has been constipated for the past 2 weeks. Actually seems like she alternates between constipation and loose stools quite frequently. She is on fiber supplements which I recommend she continue. She is certain that her stomach empties poorly and her digestion is slow and is insisting upon symptom of the test to measure her gastric function. I have set her up with a gastric emptying scan and we will check her stool for Clostridium difficile.

## 2010-11-21 ENCOUNTER — Other Ambulatory Visit: Payer: Medicare Other

## 2010-11-21 DIAGNOSIS — R1013 Epigastric pain: Secondary | ICD-10-CM

## 2010-11-21 NOTE — Telephone Encounter (Signed)
Left message on machine to call back  

## 2010-11-22 LAB — CLOSTRIDIUM DIFFICILE BY PCR: Toxigenic C. Difficile by PCR: DETECTED — CR

## 2010-11-23 ENCOUNTER — Telehealth: Payer: Self-pay | Admitting: *Deleted

## 2010-11-23 NOTE — Telephone Encounter (Signed)
LMOM for pt to call back for + CDIFF instructions.

## 2010-11-23 NOTE — Telephone Encounter (Signed)
Message copied by Graciella Freer on Fri Nov 23, 2010  1:53 PM ------      Message from: Rob Bunting      Created: Fri Nov 23, 2010  1:04 PM                   Please call the patient.  The stool tests were + for c. Difficile.  She needs to be started on flagyl 250mg  po tid for 2 weeks, no refills, she should call 2 weeks after completing the antibiotics to report on her symptoms.

## 2010-11-26 MED ORDER — METRONIDAZOLE 250 MG PO TABS: 250.0000 mg | ORAL_TABLET | Freq: Three times a day (TID) | ORAL | Status: AC

## 2010-11-26 NOTE — Telephone Encounter (Signed)
Notified pt of C. Diff results and that she needs to be on Flagyl x 2 weeks. Pt requests that I order the drug at [facility].

## 2010-11-27 NOTE — Assessment & Plan Note (Signed)
[facility]                            CARDIOLOGY OFFICE NOTE   NAME:[name] [name] REINERTSEN                       MRN:          [id number]  DATE:01/09/2007                            DOB:          03-Jun-1934    PRIMARY CARE PHYSICIAN:  [name] [name], M.D.   REASON FOR PRESENTATION:  Evaluate patient with aortic stenosis.   HISTORY OF PRESENT ILLNESS:  Patient is a pleasant, 77 year old, whom I  saw last year.  She has a heart murmur and was found to have mild aortic  stenosis.  There is some basal septal hypertrophy, as well.  The  stenosis is mild.  She has well-preserved ejection fraction.   She returns for yearly followup.  She has had a rough year with urinary  tract infection and some bowel problems and thrush with antibiotics.  She has just started to recover from this and has started walking.  With  her level of activity, she denies any chest discomfort, neck discomfort,  arm discomfort, activity-induced nausea or vomiting, excessive  diaphoresis.  She has had no palpitations, presyncope or syncope.  She  has had no shortness of breath.   PAST MEDICAL HISTORY:  Mild aortic stenosis, chronic urinary tract  infections, diverticulitis, irritable bowel syndrome, esophagitis,  hysterectomy for apparent uterine cancer in 1973.   ALLERGIES:  FLOXIN, MACROBID, QUESTIONABLY SULFA.   MEDICATIONS:  1. Protonix 40 mg b.i.d.  2. Altace 10 mg daily.  3. Hydrochlorothiazide 25 mg daily.  4. Vytorin 10/20 daily.  5. Metamucil.  6. Benefiber.  7. Librax.  8. Xyzal 5 mg.  9. Stool softener.   REVIEW OF SYSTEMS:  Negative for other systems.   PHYSICAL EXAMINATION:  The patient is in no distress.  Blood pressure  150/84, heart rate 80 and regular.  NECK:  No jugular venous distention, wave form within normal limits.  Carotid upstroke brisk and symmetric.  No bruits, no thyromegaly.  LYMPHATICS:  No adenopathy.  LUNGS:  Clear to auscultation  bilaterally.  BACK:  No costovertebral angle tenderness.  CHEST:  Unremarkable.  HEART:  PMI not displaced or sustained.  S1 and S2 within normal limits.  No S3, no S4, 1/6 apical systolic murmur, early peaking, radiating out  the aortic outflow tract and best heard in the left lateral decubitus  position, no diastolic murmurs.  ABDOMEN:  Flat, positive bowel sounds, normal in frequency and pitch.  No bruits, no rebound, no guarding, no midline pulsatile mass, no  organomegaly.  SKIN:  No rashes, no nodules.  EXTREMITIES:  Two-plus pulses, no edema, no cyanosis, no clubbing.  NEUROLOGIC:  Oriented to person, place and time.  Cranial nerves II  through XII grossly intact.  Motor grossly intact.   EKG:  Sinus rhythm, rate 76, axis leftward, intervals within normal  limits, no acute ST-wave changes.   ASSESSMENT AND PLAN:  1. Aortic stenosis:  The patient's murmur is even softer than I      appreciated before.  At this point, I would not suggest that she      needs any further cardiovascular  testing.  Rather, she can come      back in a couple of years and we can do another physical exam.  She      will come back if she has any increasing shortness of breath,      dyspnea or chest pain.  2. Hypertension:  The patient's blood pressure is slightly elevated.      This is unusual.  She will keep an eye on this and continue the      medications as listed.  3. Followup:  I will see her back as above.     Rollene Rotunda, MD, [facility]  Electronically Signed    JH/MedQ  DD: 01/09/2007  DT: 01/09/2007  Job #: [id number]   cc:   [name] [name], M.D.

## 2010-11-27 NOTE — Assessment & Plan Note (Signed)
[facility]                            CARDIOLOGY OFFICE NOTE   NAME:Michelle [name], [name]                       MRN:          [id number]  DATE:06/22/2008                            DOB:          03-05-1934    PRIMARY CARE PHYSICIAN:  Bennie Pierini, NP   REASON FOR PRESENTATION:  Evaluate the patient with dyspnea and chest  discomfort.   HISTORY OF PRESENT ILLNESS:  The patient is now 77 years old.  I saw her  last in 2007.  At that time, she had a heart murmur.  The murmur was  found to be with some mild aortic stenosis.  She is now being referred  back because of multiple complaints.  Among these is dyspnea.  She  states that at times, she feels like she cannot get a deep breath.  She  has to physically strive to breathe deeply to overcome this.  It may  last for few minutes.  This happens sporadically.  She is not describing  PND or orthopnea.  She can do activities like climbing flight of stairs  without bring this on.  She says sometimes it hurts to take a deep  breath or move in certain direction.  This is a sharp discomfort under  her left breast.  She has multiple other somatic complaints including  fatigue and pain between her shoulder blades.  This discomfort is  sporadic and not associated with other symptoms.  She also has some neck  discomfort which is clearly with movement of her neck.  These have all  been slowly progressive.  She has not taken anything to try to get rid  of these symptoms.  She is not describing classic substernal chest  pressure.  She is not having any palpitations, presyncope, or syncope.   PAST MEDICAL HISTORY:  1. Mild aortic stenosis.  2. Chronic urinary tract infections.  3. Diverticulitis.  4. Irritable bowel syndrome.  5. Esophagitis.   PAST SURGICAL HISTORY:  Hysterectomy for apparent uterine cancer in  1973.   ALLERGIES:  FLOXIN, MACROBID, and questionable SULFA allergy.   MEDICATIONS:  1.  Protonix 40 mg daily.  2. Altace 10 mg daily.  3. Hydrochlorothiazide 25 mg daily.  4. Vytorin 10/20 daily.  5. Metamucil.  6. Benefiber.  7. Librax 2.5 mg p.r.n.  8. Aspirin 81 mg daily.   REVIEW OF SYSTEMS:  As stated in the HPI and otherwise negative for  other systems.   PHYSICAL EXAMINATION:  GENERAL:  The patient is in no distress.  VITAL SIGNS:  Blood pressure 132/74, heart rate 79 and regular, weight  184 pounds, and body mass index 28.  HEENT:  Eyes are unremarkable, pupils equal, round, and reactive to  light, fundi not visualized, oral mucosa unremarkable.  NECK:  No jugular venous distension.  Waveform within normal limits.  Carotid upstroke brisk and symmetric.  Mild bilateral carotid bruits.  No thyromegaly.  LYMPHATICS:  No cervical, axillary, or inguinal adenopathy.  LUNGS:  Clear to auscultation bilaterally.  BACK:  No costovertebral angle tenderness.  CHEST:  Unremarkable.  HEART:  PMI not displaced or sustained.  S1 and S2 within normal limits.  No S3, no S4, 3/6 apical systolic murmur best heard at the left sternal  border and early systole and early peaking, no diastolic murmurs.  ABDOMEN:  Flat.  Positive bowel sounds.  Normal in frequency and pitch,  no bruits, no rebound, no guarding, no midline pulsatile mass, no  hepatomegaly, no splenomegaly.  SKIN:  No rashes.  No nodules.  EXTREMITIES:  Pulses 2+ throughout, no edema, no cyanosis, no clubbing.  NEUROLOGIC:  Oriented to person, place, and time, cranial nerves II-XII  grossly intact, motor grossly intact.   EKG, sinus rhythm, rate 79, leftward axis, intervals within normal  limits, and no acute ST-T wave changes.   ASSESSMENT AND PLAN:  1. Dyspnea.  The patient has progressive dyspnea although she has      multiple somatic complaints.  At this point, I think it would be      unlikely that any of this is cardiac.  However, she did have aortic      stenosis and it has been a couple of years since the  last      evaluation.  Therefore, this needs to be reevaluated with an      echocardiogram.  Further evaluation will be based on these results.  2. Chest discomfort/neck discomfort.  This is all atypical.  I would      suspect this as more musculoskeletal.  It could be related to      fibromyalgia.  Questionably this could be related to blockage in      her carotid arteries.  However, we have evaluated these with a      Doppler and there is no evidence of stenosis.  There could be      probability of having developed stenosis since that study is      extremely well.  I will refer her back to her primary care doctors      for management of these complaints.  3. Hypertension.  Blood pressure is currently controlled on the meds      as listed and she will continue with these.  4. Dyslipidemia per Bennie Pierini.  5. Fatigue.  This is another somatic complaint.  I do not see a      cardiac ideology to this.  Again I will defer it to her primary      care team to follow any of the usual suspects such as thyroid or      vitamin D levels.  6. Followup.  I will see her back in the clinic based on the results      of the echocardiogram.     Rollene Rotunda, MD, [facility]  Electronically Signed    JH/MedQ  DD: 06/22/2008  DT: 06/23/2008  Job #: [id number]   cc:   Bennie Pierini, NP

## 2010-11-30 NOTE — Assessment & Plan Note (Signed)
[place] [facility]                         GASTROENTEROLOGY OFFICE NOTE   NAME:Michelle [name], Michelle [name]                       MRN:          [id number]  DATE:11/05/2006                            DOB:          07-Nov-1933    [name] comes in and says that for several weeks she is still feeling  somewhat dizzy and with abdominal pain. Says thinks it might be related  to E-coli or she was told this because this was found in her urine. She  still has some nausea, dry cough with yellow sputum in the mornings. She  said she took the Cipro and Augmentin given to her by her primary care  doctor. She said the Align did not help much, but she seemed to do a  little bit better after taking the medication for the urinary tract  infection. She says she still has this cough and I told her she needs to  see her primary care doctor to get a chest x-ray. She said she has pain  in her back. She showed that she really related this to her  costovertebral angle area which I thought might be her kidneys. I told  her that she needed to followup with Dr. Vernon Prey, but she said she  finds it hard to get to see him, but I told her that I thought she  needed to see someone about the way she is presently feeling and I  thought much of her GI symptomatology is secondarily related.   [name] is a very nice lady whom I have treated for gastroesophageal  reflux disease, colon polyps, also has had some underlying history of  diverticulitis, proctalgia fugax, anxiety, depression, motility changes  in her GI system, and constipation.   I think there is a great deal of emotional component here, but I think  the most important thing is that she followup with her primary care  doctor to make sure there is no more serious underlying disorder. If her  symptoms persist, then I think she should followup with Dr. Leone Payor or  one of my associates in my retirement.     Ulyess Mort, MD  Electronically  Signed    SML/MedQ  DD: 11/05/2006  DT: 11/05/2006  Job #: [id number]

## 2010-11-30 NOTE — Assessment & Plan Note (Signed)
 HEALTHCARE                         GASTROENTEROLOGY OFFICE NOTE   NAME:Michelle [name], Michelle [name]                       MRN:          [id number]  DATE:10/22/2006                            DOB:          May 10, 1934    This very nice patient of mine comes in on October 22, 2006.  She says she  has just finished her Cipro and Augmentin.  She had another urinary  tract infection and started getting some indigestion and stomach  problems that had gone into her back and shoulder.  She gets some  discomfort in her navel area as well with burning in her chest and  stomach.  She has had some low-grade fever almost daily with some chills  and diaphoresis.  She treats herself sometimes with Tylenol.  She said  her GERD has gotten worse to some degree.  She denies any diarrhea.  Bowels are unusual in color and form.  She said she does feel sick at  times, tired with indigestion.   PHYSICAL EXAMINATION:  Essentially unremarkable.   Dictation ended at this point.     Ulyess Mort, MD  Electronically Signed    SML/MedQ  DD: 10/22/2006  DT: 10/23/2006  Job #: [id number]

## 2010-11-30 NOTE — Assessment & Plan Note (Signed)
[facility]                         GASTROENTEROLOGY OFFICE NOTE   NAME:[name], [name]                       MRN:          [id number]  DATE:10/22/2006                            DOB:          02-Aug-1933    Michelle [name] comes in somewhat depressed and anxious, worried.  Said she just  finished Cipro and Augmentin for a urinary tract infection.  Started  getting indigestion, and has had some stomach problems, and had pain  into her back and shoulders.  She is worried about an underlying process  such as cancer, and so on.  She says she gets sore in her naval with  burning in her chest and her stomach, low-grade fever almost daily, some  diaphoresis.  She has taken some Tylenol.  Has GERD.  Protonix b.i.d.  has helped some.  She says her stomach is sore.  She wondered about  diverticulitis.  She denies any diarrhea, instead more like  constipation.  She said her bowel movements have been an unusual color  and form at times.  She says sometimes she needs a stool softener.   PAST MEDICAL HISTORY:  Really not severe, and noting only some chronic  urinary tract infections, history of irritable bowel in the past, and  some diverticulitis as well as esophagitis and GERD.  She has had a  hysterectomy for uterine cancer in 1973.   SHE IS ALLERGIC TO:  1. FLOMAX.  2. MACROBID.  3. SULFA.   She presently takes Altace, Protonix, hydrochlorothiazide, Vytorin,  Metamucil, Benefiber, and Atenolol, we discussed.   FAMILY HISTORY:  Noncontributory.   PHYSICAL EXAMINATION:  She weighs about the same as she always does.  She looks fairly good.  She weighs 130.  Blood pressure 120/76.  Pulse 79.  NECK:  Supple.  EXTREMITIES:  Unremarkable.  ABDOMEN:  Soft with no masses or organomegaly.  No bruits or rubs.  It  was non-tender to palpation.  Certainly, no tenderness to palpation of  the left lower quadrant of any significance.  RECTAL:  Deferred.   IMPRESSION:  1.  Obscure abdominal pain of questionable etiology.  2. History of hypertension.  3. Aortic sclerosis versus stenosis and a carotid bruit.  4. Irritable bowel syndrome.  5. Gastroesophageal reflux disease.  6. Anxiety with probable associated DEPRESSION.   RECOMMENDATIONS:  We will get a CT of her abdomen for completeness and  reassurance.  Get routine labs.  Continue on her Protonix.  Get stool  for O and P and culture.  I will see her back in several weeks in the  office.     Ulyess Mort, MD  Electronically Signed    SML/MedQ  DD: 10/22/2006  DT: 10/22/2006  Job #: [id number]

## 2010-12-07 ENCOUNTER — Encounter (HOSPITAL_COMMUNITY): Payer: Self-pay

## 2010-12-07 ENCOUNTER — Ambulatory Visit (HOSPITAL_COMMUNITY)
Admission: RE | Admit: 2010-12-07 | Discharge: 2010-12-07 | Disposition: A | Discharge status: Home / Self Care | Payer: Medicare Other | Source: Ambulatory Visit | Attending: Gastroenterology | Admitting: Gastroenterology

## 2010-12-07 DIAGNOSIS — K5900 Constipation, unspecified: Secondary | ICD-10-CM | POA: Insufficient documentation

## 2010-12-07 DIAGNOSIS — R142 Eructation: Secondary | ICD-10-CM | POA: Insufficient documentation

## 2010-12-07 DIAGNOSIS — K3189 Other diseases of stomach and duodenum: Secondary | ICD-10-CM | POA: Insufficient documentation

## 2010-12-07 DIAGNOSIS — R110 Nausea: Secondary | ICD-10-CM | POA: Insufficient documentation

## 2010-12-07 DIAGNOSIS — R1013 Epigastric pain: Secondary | ICD-10-CM

## 2010-12-07 DIAGNOSIS — R141 Gas pain: Secondary | ICD-10-CM | POA: Insufficient documentation

## 2010-12-07 DIAGNOSIS — R109 Unspecified abdominal pain: Secondary | ICD-10-CM | POA: Insufficient documentation

## 2010-12-07 MED ORDER — TECHNETIUM TC 99M SULFUR COLLOID
2.1000 | Freq: Once | INTRAVENOUS | Status: AC | PRN
  Administered 2010-12-07: 2.1 via ORAL

## 2011-01-22 ENCOUNTER — Encounter: Payer: Self-pay | Admitting: Gastroenterology

## 2011-01-22 ENCOUNTER — Ambulatory Visit (INDEPENDENT_AMBULATORY_CARE_PROVIDER_SITE_OTHER): Payer: Medicare Other | Admitting: Gastroenterology

## 2011-01-22 VITALS — BP 132/68 | HR 92 | Ht 67.0 in | Wt 159.0 lb

## 2011-01-22 DIAGNOSIS — K219 Gastro-esophageal reflux disease without esophagitis: Secondary | ICD-10-CM

## 2011-01-22 DIAGNOSIS — K3184 Gastroparesis: Secondary | ICD-10-CM

## 2011-01-22 NOTE — Patient Instructions (Addendum)
Continue on prilosec once a day (OTC).  Best time is 20-30 min prior to breakfast meal. Continue eating 4-5 meals a day. Call if new problems arise.

## 2011-01-22 NOTE — Progress Notes (Signed)
Review of pertinent gastrointestinal problems:  1. Routine risk for colon cancer; colonoscopy 2011 (Dr. Christella Hartigan) found no polyps, next screening colonoscopy at 10 year interval  2. Loose stools: colonoscopy, look in terminal ileum 2011 found no cause, random colon biopsies showed no microscopic colitis;  May 2012 came in following diarrheal illness that ended up with extreme constipation. Stool studies showed Clostridium difficile. Was treated with Flagyl for 2 weeks. 3. Moderate, pan-gastritis on EGD 2011, biopsies showed no H. Pylori 4. gastroparesis noted on gastric emptying scan May 2012.  HPI: This is a  Pleasant 77 yo woman.    Completed flagyl antibiotics.  She noticed she was "better" but cannot really recall how she was better.  The look of her stool was more normal.  She also overall felt better (appetite improved, energy improved).  She usually has 4-5 meals per day, including snacks.  Avoids meat.    She is not as bothered by sensation of slow stomach as much anymore.    Review of systems: Pertinent positive and negative review of systems were noted in the above HPI section.  All other review of systems was otherwise negative.   Past Medical History  Diagnosis Date  . Duodenitis without mention of hemorrhage   . Esophagitis, unspecified   . Diverticulosis   . UTI (lower urinary tract infection)   . IBS (irritable bowel syndrome)   . Uterine cancer     Past Surgical History  Procedure Date  . Vaginal hysterectomy   . Foot surgery     bilateral      reports that she has never smoked. She has never used smokeless tobacco. She reports that she does not drink alcohol or use illicit drugs.  family history includes Colon cancer in an unspecified family member.    Current Medications, Allergies were all reviewed with the patient via [facility] electronic medical record system.    Physical Exam: BP 132/68  Pulse 92  Ht 5\' 7"  (1.702 m)  Wt 159 lb (72.122 kg)   BMI 24.90 kg/m2 Constitutional: generally well-appearing Psychiatric: alert and oriented x3 Eyes: extraocular movements intact Mouth: oral pharynx moist, no lesions Neck: supple no lymphadenopathy Cardiovascular: heart regular rate and rhythm Lungs: clear to auscultation bilaterally Abdomen: soft, nontender, nondistended, no obvious ascites, no peritoneal signs, normal bowel sounds Extremities: no lower extremity edema bilaterally Skin: no lesions on visible extremities    Assessment and plan: 77 y.o. female with  GERD, intermittent constipation recent Clostridium difficile infection by PCR  She is overall feeling better. She will continue to monitor her bowels. Prilosec seems to be helping her chronic GERD symptoms quite well and she will continue on once daily.

## 2012-10-12 ENCOUNTER — Ambulatory Visit (INDEPENDENT_AMBULATORY_CARE_PROVIDER_SITE_OTHER): Payer: Medicare Other | Admitting: *Deleted

## 2012-10-12 DIAGNOSIS — E538 Deficiency of other specified B group vitamins: Secondary | ICD-10-CM

## 2012-10-12 MED ORDER — CYANOCOBALAMIN 1000 MCG/ML IJ SOLN
1000.0000 ug | Freq: Once | INTRAMUSCULAR | Status: AC
  Administered 2012-10-12: 1000 ug via INTRAMUSCULAR

## 2012-10-12 NOTE — Progress Notes (Signed)
Patient tolerated well.  Will return in 1 month.

## 2012-10-12 NOTE — Patient Instructions (Addendum)
Vitamin B12 Injections Every person needs vitamin B12. A deficiency develops when the body does not get enough of it. One way to overcome this is by getting B12 shots (injections). A B12 shot puts the vitamin directly into muscle tissue. This avoids any problems your body might have in absorbing it from food or a pill. In some people, the body has trouble using the vitamin correctly. This can cause a B12 deficiency. Not consuming enough of the vitamin can also cause a deficiency. Getting enough vitamin B12 can be hard for elderly people. Sometimes, they do not eat a well-balanced diet. The elderly are also more likely than younger people to have medical conditions or take medications that can lead to a deficiency. WHAT DOES VITAMIN B12 DO? Vitamin B12 does many things to help the body work right:  It helps the body make healthy red blood cells.  It helps maintain nerve cells.  It is involved in the body's process of converting food into energy (metabolism).  It is needed to make the genetic material in all cells (DNA). VITAMIN B12 FOOD SOURCES Most people get plenty of vitamin B12 through the foods they eat. It is present in:  Meat, fish, poultry, and eggs.  Milk and milk products.  It also is added when certain foods are made, including some breads, cereals and yogurts. The food is then called "fortified". CAUSES The most common causes of vitamin B12 deficiency are:  Pernicious anemia. The condition develops when the body cannot make enough healthy red blood cells. This stems from a lack of a protein made in the stomach (intrinsic factor). People without this protein cannot absorb enough vitamin B12 from food.  Malabsorption. This is when the body cannot absorb the vitamin. It can be caused by:  Pernicious anemia.  Surgery to remove part or all of the stomach can lead to malabsorption. Removal of part or all of the small intestine can also cause malabsorption.  Vegetarian diet.  People who are strict about not eating foods from animals could have trouble taking in enough vitamin B12 from diet alone.  Medications. Some medicines have been linked to B12 deficiency, such as Metformin (a drug prescribed for type 2 diabetes). Long-term use of stomach acid suppressants also can keep the vitamin from being absorbed.  Intestinal problems such as inflammatory bowel disease. If there are problems in the digestive tract, vitamin B12 may not be absorbed in good enough amounts. SYMPTOMS People who do not get enough B12 can develop problems. These can include:  Anemia. This is when the body has too few red blood cells. Red blood cells carry oxygen to the rest of the body. Without a healthy supply of red blood cells, people can feel:  Tired (fatigued).  Weak.  Severe anemia can cause:  Shortness of breath.  Dizziness.  Rapid heart rate.  Paleness.  Other Vitamin B12 deficiency symptoms include:  Diarrhea.  Numbness or tingling in the hands or feet.  Loss of appetite.  Confusion.  Sores on the tongue or in the mouth. LET YOUR CAREGIVER KNOW ABOUT:  Any allergies. It is very important to know if you are allergic or sensitive to cobalt. Vitamin B12 contains cobalt.  Any history of kidney disease.  All medications you are taking. Include prescription and over-the-counter medicines, herbs and creams.  Whether you are pregnant or breast-feeding.  If you have Leber's disease, a hereditary eye condition, vitamin B12 could make it worse. RISKS AND COMPLICATIONS Reactions to an injection are   usually temporary. They might include:  Pain at the injection site.  Redness, swelling or tenderness at the site.  Headache, dizziness or weakness.  Nausea, upset stomach or diarrhea.  Numbness or tingling.  Fever.  Joint pain.  Itching or rash. If a reaction does not go away in a short while, talk with your healthcare provider. A change in the way the shots are  given, or where they are given, might need to be made. BEFORE AN INJECTION To decide whether B12 injections are right for you, your healthcare provider will probably:  Ask about your medical history.  Ask questions about your diet.  Ask about symptoms such as:  Have you felt weak?  Do you feel unusually tired?  Do you get dizzy?  Order blood tests. These may include a test to:  Check the level of red cells in your blood.  Measure B12 levels.  Check for the presence of intrinsic factor. VITAMIN B12 INJECTIONS How often you will need a vitamin B12 injection will depend on how severe your deficiency is. This also will affect how long you will need to get them. People with pernicious anemia usually get injections for their entire life. Others might get them for a shorter period. For many people, injections are given daily or weekly for several weeks. Then, once B12 levels are normal, injections are given just once a month. If the cause of the deficiency can be fixed, the injections can be stopped. Talk with your healthcare provider about what you should expect. For an injection:  The injection site will be cleaned with an alcohol swab.  Your healthcare provider will insert a needle directly into a muscle. Most any muscle can be used. Most often, an arm muscle is used. A buttocks muscle can also be used. Many people say shots in that area are less painful.  A small adhesive bandage may be put over the injection site. It usually can be taken off in an hour or less. Injections can be given by your healthcare provider. In some cases, family members give them. Sometimes, people give them to themselves. Talk with your healthcare provider about what would be best for you. If someone other than your healthcare provider will be giving the shots, the person will need to be trained to give them correctly. HOME CARE INSTRUCTIONS   You can remove the adhesive bandage within an hour of getting a  shot.  You should be able to go about your normal activities right away.  Avoid drinking large amounts of alcohol while taking vitamin B12 shots. Alcohol can interfere with the body's use of the vitamin. SEEK MEDICAL CARE IF:   Pain, redness, swelling or tenderness at the injection site does not get better or gets worse.  Headache, dizziness or weakness does not go away.  You develop a fever of more than 100.5 F (38.1 C). SEEK IMMEDIATE MEDICAL CARE IF:   You have chest pain.  You develop shortness of breath.  You have muscle weakness that gets worse.  You develop numbness, weakness or tingling on one side or one area of the body.  You have symptoms of an allergic reaction, such as:  Hives.  Difficulty breathing.  Swelling of the lips, face, tongue or throat.  You develop a fever of more than 102.0 F (38.9 C). MAKE SURE YOU:   Understand these instructions.  Will watch your condition.  Will get help right away if you are not doing well or get worse. Document   Released: 09/27/2008 Document Revised: 09/23/2011 Document Reviewed: 09/27/2008 ExitCare Patient Information 2013 [employer].  

## 2012-10-21 ENCOUNTER — Ambulatory Visit: Payer: Self-pay | Admitting: Nurse Practitioner

## 2012-11-09 ENCOUNTER — Other Ambulatory Visit: Payer: Self-pay | Admitting: *Deleted

## 2012-11-09 MED ORDER — FLUTICASONE PROPIONATE 50 MCG/ACT NA SUSP: 2.0000 | Freq: Every day | NASAL | Status: DC

## 2012-12-25 ENCOUNTER — Other Ambulatory Visit: Payer: Self-pay | Admitting: *Deleted

## 2012-12-25 MED ORDER — RAMIPRIL 10 MG PO CAPS: 10.0000 mg | ORAL_CAPSULE | Freq: Every day | ORAL | Status: DC

## 2013-01-07 ENCOUNTER — Ambulatory Visit (INDEPENDENT_AMBULATORY_CARE_PROVIDER_SITE_OTHER): Payer: Medicare Other | Admitting: Physician Assistant

## 2013-01-07 ENCOUNTER — Encounter: Payer: Self-pay | Admitting: Physician Assistant

## 2013-01-07 VITALS — BP 146/76 | HR 70 | Temp 97.6°F | Wt 182.6 lb

## 2013-01-07 DIAGNOSIS — R300 Dysuria: Secondary | ICD-10-CM

## 2013-01-07 LAB — POCT UA - MICROSCOPIC ONLY
Bacteria, U Microscopic: NEGATIVE
Crystals, Ur, HPF, POC: NEGATIVE

## 2013-01-07 LAB — POCT URINALYSIS DIPSTICK
Bilirubin, UA: NEGATIVE
Glucose, UA: NEGATIVE
Spec Grav, UA: 1.015

## 2013-01-07 MED ORDER — CIPROFLOXACIN HCL 500 MG PO TABS: 500.0000 mg | ORAL_TABLET | Freq: Two times a day (BID) | ORAL | Status: DC

## 2013-01-07 NOTE — Patient Instructions (Signed)
Urinary Tract Infection  Urinary tract infections (UTIs) can develop anywhere along your urinary tract. Your urinary tract is your body's drainage system for removing wastes and extra water. Your urinary tract includes two kidneys, two ureters, a bladder, and a urethra. Your kidneys are a pair of bean-shaped organs. Each kidney is about the size of your fist. They are located below your ribs, one on each side of your spine.  CAUSES  Infections are caused by microbes, which are microscopic organisms, including fungi, viruses, and bacteria. These organisms are so small that they can only be seen through a microscope. Bacteria are the microbes that most commonly cause UTIs.  SYMPTOMS   Symptoms of UTIs may vary by age and gender of the patient and by the location of the infection. Symptoms in young women typically include a frequent and intense urge to urinate and a painful, burning feeling in the bladder or urethra during urination. Older women and men are more likely to be tired, shaky, and weak and have muscle aches and abdominal pain. A fever may mean the infection is in your kidneys. Other symptoms of a kidney infection include pain in your back or sides below the ribs, nausea, and vomiting.  DIAGNOSIS  To diagnose a UTI, your caregiver will ask you about your symptoms. Your caregiver also will ask to provide a urine sample. The urine sample will be tested for bacteria and white blood cells. White blood cells are made by your body to help fight infection.  TREATMENT   Typically, UTIs can be treated with medication. Because most UTIs are caused by a bacterial infection, they usually can be treated with the use of antibiotics. The choice of antibiotic and length of treatment depend on your symptoms and the type of bacteria causing your infection.  HOME CARE INSTRUCTIONS   If you were prescribed antibiotics, take them exactly as your caregiver instructs you. Finish the medication even if you feel better after you  have only taken some of the medication.   Drink enough water and fluids to keep your urine clear or pale yellow.   Avoid caffeine, tea, and carbonated beverages. They tend to irritate your bladder.   Empty your bladder often. Avoid holding urine for long periods of time.   Empty your bladder before and after sexual intercourse.   After a bowel movement, women should cleanse from front to back. Use each tissue only once.  SEEK MEDICAL CARE IF:    You have back pain.   You develop a fever.   Your symptoms do not begin to resolve within 3 days.  SEEK IMMEDIATE MEDICAL CARE IF:    You have severe back pain or lower abdominal pain.   You develop chills.   You have nausea or vomiting.   You have continued burning or discomfort with urination.  MAKE SURE YOU:    Understand these instructions.   Will watch your condition.   Will get help right away if you are not doing well or get worse.  Document Released: 04/10/2005 Document Revised: 12/31/2011 Document Reviewed: 08/09/2011  ExitCare Patient Information 2014 [employer].

## 2013-01-07 NOTE — Progress Notes (Signed)
Subjective:     Patient ID: Michelle [name], female   DOB: [birth year], 79 y.o.   MRN: [id number]  HPI Pt seen as WI with several day hx of dysuria and polyuria States has a hx of freq UTI Last sx were several months ago Denies fever/chills She has used OTC AZO and cranberry juice for sx  Review of Systems     Objective:   Physical Exam NAD No CVAT Abd- soft, sl TTP LUQ, LLQ, suprapubic area UA- see labs    Assessment:     Dysuria    Plan:     Cipro rx for 1 week Fluids OTC AZO for sx relief F/U prn

## 2013-01-27 ENCOUNTER — Telehealth: Payer: Self-pay | Admitting: Nurse Practitioner

## 2013-01-27 MED ORDER — RAMIPRIL 10 MG PO CAPS: 10.0000 mg | ORAL_CAPSULE | Freq: Every day | ORAL | Status: DC

## 2013-01-27 NOTE — Telephone Encounter (Signed)
rx sent to pharmacy

## 2013-01-28 NOTE — Telephone Encounter (Signed)
Patient notified that rx sent to pharmacy 

## 2013-02-11 ENCOUNTER — Ambulatory Visit (INDEPENDENT_AMBULATORY_CARE_PROVIDER_SITE_OTHER): Payer: Medicare Other | Admitting: Nurse Practitioner

## 2013-02-11 ENCOUNTER — Encounter: Payer: Self-pay | Admitting: Nurse Practitioner

## 2013-02-11 VITALS — BP 171/89 | HR 77 | Temp 97.9°F | Ht 67.0 in | Wt 180.0 lb

## 2013-02-11 DIAGNOSIS — N390 Urinary tract infection, site not specified: Secondary | ICD-10-CM

## 2013-02-11 DIAGNOSIS — K219 Gastro-esophageal reflux disease without esophagitis: Secondary | ICD-10-CM

## 2013-02-11 DIAGNOSIS — I10 Essential (primary) hypertension: Secondary | ICD-10-CM | POA: Insufficient documentation

## 2013-02-11 DIAGNOSIS — R350 Frequency of micturition: Secondary | ICD-10-CM

## 2013-02-11 DIAGNOSIS — Z13820 Encounter for screening for osteoporosis: Secondary | ICD-10-CM

## 2013-02-11 DIAGNOSIS — D510 Vitamin B12 deficiency anemia due to intrinsic factor deficiency: Secondary | ICD-10-CM

## 2013-02-11 LAB — POCT URINALYSIS DIPSTICK
Ketones, UA: NEGATIVE
Nitrite, UA: NEGATIVE
Protein, UA: 100
pH, UA: 8

## 2013-02-11 LAB — POCT UA - MICROSCOPIC ONLY
Crystals, Ur, HPF, POC: NEGATIVE
Yeast, UA: NEGATIVE

## 2013-02-11 MED ORDER — AMOXICILLIN 875 MG PO TABS: 875.0000 mg | ORAL_TABLET | Freq: Two times a day (BID) | ORAL | Status: DC

## 2013-02-11 MED ORDER — PANTOPRAZOLE SODIUM 40 MG PO TBEC: 40.0000 mg | DELAYED_RELEASE_TABLET | Freq: Every day | ORAL | Status: DC

## 2013-02-11 MED ORDER — LISINOPRIL 20 MG PO TABS: 20.0000 mg | ORAL_TABLET | Freq: Every day | ORAL | Status: DC

## 2013-02-11 NOTE — Patient Instructions (Signed)
Urinary Tract Infection  Urinary tract infections (UTIs) can develop anywhere along your urinary tract. Your urinary tract is your body's drainage system for removing wastes and extra water. Your urinary tract includes two kidneys, two ureters, a bladder, and a urethra. Your kidneys are a pair of bean-shaped organs. Each kidney is about the size of your fist. They are located below your ribs, one on each side of your spine.  CAUSES  Infections are caused by microbes, which are microscopic organisms, including fungi, viruses, and bacteria. These organisms are so small that they can only be seen through a microscope. Bacteria are the microbes that most commonly cause UTIs.  SYMPTOMS   Symptoms of UTIs may vary by age and gender of the patient and by the location of the infection. Symptoms in young women typically include a frequent and intense urge to urinate and a painful, burning feeling in the bladder or urethra during urination. Older women and men are more likely to be tired, shaky, and weak and have muscle aches and abdominal pain. A fever may mean the infection is in your kidneys. Other symptoms of a kidney infection include pain in your back or sides below the ribs, nausea, and vomiting.  DIAGNOSIS  To diagnose a UTI, your caregiver will ask you about your symptoms. Your caregiver also will ask to provide a urine sample. The urine sample will be tested for bacteria and white blood cells. White blood cells are made by your body to help fight infection.  TREATMENT   Typically, UTIs can be treated with medication. Because most UTIs are caused by a bacterial infection, they usually can be treated with the use of antibiotics. The choice of antibiotic and length of treatment depend on your symptoms and the type of bacteria causing your infection.  HOME CARE INSTRUCTIONS   If you were prescribed antibiotics, take them exactly as your caregiver instructs you. Finish the medication even if you feel better after you  have only taken some of the medication.   Drink enough water and fluids to keep your urine clear or pale yellow.   Avoid caffeine, tea, and carbonated beverages. They tend to irritate your bladder.   Empty your bladder often. Avoid holding urine for long periods of time.   Empty your bladder before and after sexual intercourse.   After a bowel movement, women should cleanse from front to back. Use each tissue only once.  SEEK MEDICAL CARE IF:    You have back pain.   You develop a fever.   Your symptoms do not begin to resolve within 3 days.  SEEK IMMEDIATE MEDICAL CARE IF:    You have severe back pain or lower abdominal pain.   You develop chills.   You have nausea or vomiting.   You have continued burning or discomfort with urination.  MAKE SURE YOU:    Understand these instructions.   Will watch your condition.   Will get help right away if you are not doing well or get worse.  Document Released: 04/10/2005 Document Revised: 12/31/2011 Document Reviewed: 08/09/2011  ExitCare Patient Information 2014 [employer].

## 2013-02-11 NOTE — Progress Notes (Signed)
Subjective:    Patient ID: Michelle [name], female    DOB: 1934-05-19, 79 y.o.   MRN: [id number]  Hypertension This is a chronic problem. The current episode started more than 1 year ago. The problem is unchanged. The problem is uncontrolled. Pertinent negatives include no anxiety, chest pain, headaches, malaise/fatigue, orthopnea, palpitations, peripheral edema or shortness of breath. There are no associated agents to hypertension. Risk factors for coronary artery disease include post-menopausal state. Past treatments include ACE inhibitors and diuretics. The current treatment provides moderate improvement. Compliance problems include diet and exercise.   Hyperlipidemia This is a chronic problem. The current episode started more than 1 year ago. The problem is controlled. Recent lipid tests were reviewed and are high. She has no history of diabetes, hypothyroidism or obesity. Factors aggravating her hyperlipidemia include thiazides. Pertinent negatives include no chest pain or shortness of breath. Current antihyperlipidemic treatment includes diet change (patient can't tolerate ststins- myalgia). The current treatment provides mild improvement of lipids. Compliance problems include adherence to exercise.  Risk factors for coronary artery disease include hypertension and post-menopausal.  GERD Omeprazole QD- Doesn't seem to be working- Would like to go back on protonix Pernicious anemia PAtient use to be on b!2 injections but she quit taking  * Patient c/o dysuria, urinary frequency and nocturia- HAd UTI several weeks ago and took Cipro and dosen't think it completely resolved.  Review of Systems  Constitutional: Negative for malaise/fatigue.  Respiratory: Negative for shortness of breath.   Cardiovascular: Negative for chest pain, palpitations and orthopnea.  Neurological: Negative for headaches.  All other systems reviewed and are negative.       Objective:   Physical Exam   Constitutional: She is oriented to person, place, and time. She appears well-developed and well-nourished.  HENT:  Nose: Nose normal.  Mouth/Throat: Oropharynx is clear and moist.  Eyes: EOM are normal.  Neck: Trachea normal, normal range of motion and full passive range of motion without pain. Neck supple. No JVD present. Carotid bruit is not present. No thyromegaly present.  Cardiovascular: Normal rate, regular rhythm and intact distal pulses.  Exam reveals no gallop and no friction rub.   Murmur (2/6 systolic) heard. Pulmonary/Chest: Effort normal and breath sounds normal.  Abdominal: Soft. Bowel sounds are normal. She exhibits no distension and no mass. There is no tenderness.  Mild suprapubic pain on palpation  Genitourinary:  NO CVA tenderness bil  Musculoskeletal: Normal range of motion.  Lymphadenopathy:    She has no cervical adenopathy.  Neurological: She is alert and oriented to person, place, and time. She has normal reflexes.  Skin: Skin is warm and dry.  Psychiatric: She has a normal mood and affect. Her behavior is normal. Judgment and thought content normal.   BP 171/89  Pulse 77  Temp(Src) 97.9 F (36.6 C) (Oral)  Ht 5\' 7"  (1.702 m)  Wt 180 lb (81.647 kg)  BMI 28.19 kg/m2  Results for orders placed in visit on 02/11/13  POCT UA - MICROSCOPIC ONLY      Result Value Range   WBC, Ur, HPF, POC 10-15     RBC, urine, microscopic neg     Bacteria, U Microscopic neg     Mucus, UA occ     Epithelial cells, urine per micros occ     Crystals, Ur, HPF, POC neg     Casts, Ur, LPF, POC neg     Yeast, UA neg    POCT URINALYSIS DIPSTICK  Result Value Range   Color, UA yellow     Clarity, UA clear     Glucose, UA neg     Bilirubin, UA neg     Ketones, UA neg     Spec Grav, UA 1.010     Blood, UA neg     pH, UA 8.0     Protein, UA 100     Urobilinogen, UA negative     Nitrite, UA neg     Leukocytes, UA moderate (2+)             Assessment & Plan:  1.  Frequent urination  - POCT UA - Microscopic Only - POCT urinalysis dipstick  2. Hypertension Lw NA+ diet Stopped ramapril and changed to lisinopril 20mg  ( increase by 10mg ) - CMP14+EGFR - NMR, lipoprofile - lisinopril (PRINIVIL,ZESTRIL) 20 MG tablet; Take 1 tablet (20 mg total) by mouth daily.  Dispense: 30 tablet; Refill: 3  3. GERD (gastroesophageal reflux disease) Changed from omeprazole to protonix Watch spicy foods Do not eat 2-3 hrs prior to bedtime - pantoprazole (PROTONIX) 40 MG tablet; Take 1 tablet (40 mg total) by mouth daily.  Dispense: 90 tablet; Refill: 3  4. Osteoporosis screening Weight bearing exercsies encouraged - DG Bone Density; Future  5. UTI (urinary tract infection) Force fluids - Urine culture - amoxicillin (AMOXIL) 875 MG tablet; Take 1 tablet (875 mg total) by mouth 2 (two) times daily.  Dispense: 20 tablet; Refill: 0  6. Hyperlipidemia - low fat diet Patient stated she could not tolerate statins-   Will schedule mammogram and dexascan Patient given hemocult cards  Mary-Margaret Daphine Deutscher, FNP

## 2013-02-12 LAB — CMP14+EGFR
ALT: 10 IU/L (ref 0–32)
AST: 18 IU/L (ref 0–40)
Albumin/Globulin Ratio: 2.1 (ref 1.1–2.5)
Alkaline Phosphatase: 66 IU/L (ref 39–117)
BUN/Creatinine Ratio: 21 (ref 11–26)
Calcium: 9.6 mg/dL (ref 8.6–10.2)
GFR calc non Af Amer: 85 mL/min/{1.73_m2} (ref >59)
Potassium: 4 mmol/L (ref 3.5–5.2)
Sodium: 142 mmol/L (ref 134–144)

## 2013-02-12 LAB — NMR, LIPOPROFILE
HDL Cholesterol by NMR: 76 mg/dL (ref >=40)
LDL Size: 21.1 nm (ref >20.5)
LP-IR Score: <25 (ref <=45)
Small LDL Particle Number: 172 nmol/L (ref <=527)

## 2013-02-12 LAB — URINE CULTURE

## 2013-02-15 ENCOUNTER — Other Ambulatory Visit (INDEPENDENT_AMBULATORY_CARE_PROVIDER_SITE_OTHER): Payer: Medicare Other

## 2013-02-15 DIAGNOSIS — Z1212 Encounter for screening for malignant neoplasm of rectum: Secondary | ICD-10-CM

## 2013-02-15 NOTE — Progress Notes (Signed)
Pt dropped off FOBT only 

## 2013-02-16 ENCOUNTER — Telehealth: Payer: Self-pay | Admitting: Nurse Practitioner

## 2013-02-17 NOTE — Telephone Encounter (Signed)
Pt aware of results 

## 2013-03-10 ENCOUNTER — Ambulatory Visit (INDEPENDENT_AMBULATORY_CARE_PROVIDER_SITE_OTHER): Payer: Medicare Other | Admitting: Pharmacist

## 2013-03-10 ENCOUNTER — Ambulatory Visit (INDEPENDENT_AMBULATORY_CARE_PROVIDER_SITE_OTHER): Payer: Medicare Other

## 2013-03-10 VITALS — Ht 66.5 in | Wt 181.0 lb

## 2013-03-10 DIAGNOSIS — Z13820 Encounter for screening for osteoporosis: Secondary | ICD-10-CM

## 2013-03-10 DIAGNOSIS — M899 Disorder of bone, unspecified: Secondary | ICD-10-CM

## 2013-03-10 DIAGNOSIS — M8580 Other specified disorders of bone density and structure, unspecified site: Secondary | ICD-10-CM | POA: Insufficient documentation

## 2013-03-10 MED ORDER — RALOXIFENE HCL 60 MG PO TABS: 60.0000 mg | ORAL_TABLET | Freq: Every day | ORAL | Status: DC

## 2013-03-10 NOTE — Progress Notes (Signed)
Patient ID: Michelle [name], female   DOB: 1933/07/25, 79 y.o.   MRN: [id number]  Patient decided to try evista - rx called to [facility] pharmacy

## 2013-03-10 NOTE — Progress Notes (Addendum)
Patient ID: Michelle [name], female   DOB: 25-Nov-1933, 79 y.o.   MRN: [id number] [facility] Current Height: Height: 5' 6.5" (168.9 cm)      Max Lifetime Height:  5\' 7"  Current Weight: Weight: 181 lb (82.101 kg)       Ethnicity:Caucasian   HPI: Does pt already have a diagnosis of:  Osteopenia?  Yes Osteoporosis?  No  Back Pain?  Yes       Kyphosis?  No Prior fracture?  No Med(s) for Osteoporosis/Osteopenia:  none Med(s) previously tried for Osteoporosis/Osteopenia:  Actonel tried 2003 - caused stomach problems and Boniva tried in 2007 but unsure why stopped but pt concerned about side effects.                                                              PMH: Age at menopause:  Surgical at 79 yo Hysterectomy?  Yes Oophorectomy?  No HRT? No Steroid Use?  No Thyroid med?  No History of cancer?  Yes - uterine cancer History of digestive disorders (ie Crohn's)?  Yes - takes PPI regularly Current or previous eating disorders?  No Last Vitamin D Result:  33 (09/26/2010) Last GFR Result:  85 (02/11/2013)   FH/SH: Family history of osteoporosis?  No Parent with history of hip fracture?  No Family history of breast cancer?  No Exercise?  No- but active lifestyle Smoking?  No Alcohol?  No    Calcium Assessment Calcium Intake  # of servings/day  Calcium mg  Milk (8 oz) 1  x  300  = 300mg   Yogurt (4 oz) 1 x  200 = 200mg   Cheese (1 oz) 0 x  200 = 0  Other Calcium sources Calcium fortified OJ  250mg  + 300mg   Ca supplement 0 = 0   Estimated calcium intake per day 1050mg   **tried calcium in past but worsened chronic constipation**  DEXA Results Date of Test T-Score for AP Spine L1-L4 T-Score for neck of Left Hip T-Score for neck of Right Hip  03/10/2013 -1.0 -1.9 -1.5  01/20/2006 -0.5 -1.3 -1.4  08/24/2001 -1.4 -1.0 --        FRAX 10 year estimate: Total FX risk:  14%  (consider medication if >/= 20%) Hip FX risk:  3.5%  (consider medication if >/=  3%)  Assessment: osteopenia  Recommendations: 1.  Discussed DEXA results and fracture risks.  Recommended patient consider Evista but she would like to discuss with her gynecologist first -  Has appt in October.  Gave information about Evista and copy of DEXA results to take with her.  2.  recommend calcium 1200mg  daily through supplementation or diet. (see below) 3.  recommend weight bearing exercise - 30 minutes at least 4 days  per week.   4.  Counseled and educated about fall risk and prevention. 5.  Orders Placed This Encounter  Procedures  . Vit D  25 hydroxy (rtn osteoporosis monitoring)    Recheck DEXA:  2 years  Time spent counseling patient:  30 minutes  **Patient reconsidered and decided to start evista 60mg  daily**

## 2013-03-10 NOTE — Patient Instructions (Addendum)

## 2013-03-11 ENCOUNTER — Telehealth: Payer: Self-pay | Admitting: Pharmacist

## 2013-03-11 LAB — VITAMIN D 25 HYDROXY (VIT D DEFICIENCY, FRACTURES): Vit D, 25-Hydroxy: 32.8 ng/mL (ref 30.0–100.0)

## 2013-03-11 NOTE — Telephone Encounter (Signed)
Patient notified for vitamin D results

## 2013-03-18 ENCOUNTER — Encounter: Payer: Self-pay | Admitting: Family Medicine

## 2013-03-18 ENCOUNTER — Ambulatory Visit (INDEPENDENT_AMBULATORY_CARE_PROVIDER_SITE_OTHER): Payer: Medicare Other | Admitting: Family Medicine

## 2013-03-18 VITALS — BP 150/79 | HR 80 | Temp 98.1°F | Ht 66.5 in | Wt 178.2 lb

## 2013-03-18 DIAGNOSIS — K5900 Constipation, unspecified: Secondary | ICD-10-CM

## 2013-03-18 NOTE — Progress Notes (Signed)
  Subjective:    Patient ID: TALYA QUAIN, female    DOB: 30-Oct-1933, 79 y.o.   MRN: [id number]  HPI This 79 y.o. female presents for evaluation of bowel problems.  She states she has To strain with her BM's 2 weeks ago and she states it makes her feel weak.  She has Hx of heart murmur and sees a cardiologist and she has made an appointment with Her cardiologist.  She states she gets a weak type feeling.   Review of Systems No chest pain, SOB, HA, dizziness, vision change, N/V, diarrhea, constipation, dysuria, urinary urgency or frequency, myalgias, arthralgias or rash.     Objective:   Physical Exam Vital signs noted  Well developed well nourished female.  HEENT - Head atraumatic Normocephalic                Eyes - PERRLA, Conjuctiva - clear Sclera- Clear EOMI                Ears - EAC's Wnl TM's Wnl Gross Hearing WNL                Nose - Nares patent                 Throat - oropharanx wnl Respiratory - Lungs CTA bilateral Cardiac - RRR S1 and S2 with grade 2/4 systolic murmur right 2nd ICS GI - Abdomen soft Nontender and bowel sounds active x 4         Assessment & Plan:  Constipation - Plan: Ambulatory referral to Gastroenterology Discussed with patient that she has constipation and she states she doubts this and wants to see Dr. Gerilyn Pilgrim GI and really is just wanting a referral from our office because she thinks she can get in quicker.  I offer her a KUB and she declines.

## 2013-03-26 ENCOUNTER — Other Ambulatory Visit: Payer: Self-pay

## 2013-03-26 MED ORDER — HYDROCHLOROTHIAZIDE 25 MG PO TABS: 25.0000 mg | ORAL_TABLET | Freq: Every day | ORAL | Status: DC

## 2013-04-07 ENCOUNTER — Encounter: Payer: Self-pay | Admitting: Cardiology

## 2013-04-07 ENCOUNTER — Ambulatory Visit (INDEPENDENT_AMBULATORY_CARE_PROVIDER_SITE_OTHER): Payer: Medicare Other | Admitting: Cardiology

## 2013-04-07 VITALS — BP 147/80 | HR 81 | Ht 67.0 in | Wt 178.0 lb

## 2013-04-07 DIAGNOSIS — R079 Chest pain, unspecified: Secondary | ICD-10-CM

## 2013-04-07 DIAGNOSIS — I359 Nonrheumatic aortic valve disorder, unspecified: Secondary | ICD-10-CM

## 2013-04-07 NOTE — Progress Notes (Signed)
HPI The patient presents for evaluation. He has been greater than 3 years since I last on her. She had some mild aortic stenosis in the past on an echo in 2009. She says she has been having increasing fatigue very however, she has significant problems with her stomach which has been a long-standing problem and wonders if this could be related. She does not describe PND or orthopnea. He does not have palpitations, presyncope or syncope. She doesn't have any chest pressure she has occasional sharp pains under her left breast. These don't happen with activities. She does do her activities of daily living which is going up and downstairs for a longer.  She is able to make the stairs without stopping.  Allergies  Allergen Reactions  . Nitrofurantoin   . Sulfonamide Derivatives     Current Outpatient Prescriptions  Medication Sig Dispense Refill  . cetirizine (ZYRTEC ALLERGY) 10 MG tablet Take 10 mg by mouth. As needed      . Cholecalciferol (VITAMIN D3) 2000 UNITS TABS Take by mouth daily.        Marland Kitchen docusate sodium (COLACE) 100 MG capsule Take 100 mg by mouth. As needed      . fluticasone (FLONASE) 50 MCG/ACT nasal spray Place 2 sprays into the nose daily.  16 g  3  . hydrochlorothiazide (HYDRODIURIL) 25 MG tablet Take 1 tablet (25 mg total) by mouth daily.  30 tablet  5  . lisinopril (PRINIVIL,ZESTRIL) 20 MG tablet Take 1 tablet (20 mg total) by mouth daily.  30 tablet  3  . pantoprazole (PROTONIX) 40 MG tablet Take 1 tablet (40 mg total) by mouth daily.  90 tablet  3  . Probiotic Product (ALIGN) 4 MG CAPS Take 1 capsule by mouth daily.        . Psyllium (METAMUCIL) 30.9 % POWD Take by mouth. At night      . Wheat Dextrin (BENEFIBER) POWD Take by mouth. Takes in the morning       No current facility-administered medications for this visit.    Past Medical History  Diagnosis Date  . Duodenitis without mention of hemorrhage   . Esophagitis, unspecified   . Diverticulosis   . UTI (lower  urinary tract infection)   . IBS (irritable bowel syndrome)   . Uterine cancer     Past Surgical History  Procedure Laterality Date  . Vaginal hysterectomy    . Foot surgery      bilateral     Family History  Problem Relation Age of Onset  . Colon cancer      Per patient unsure of what cancer in family   . Heart disease Mother   . COPD Father   . Eczema Father   . Heart disease Father     History   Social History  . Marital Status: Married    Spouse Name: N/A    Number of Children: 3  . Years of Education: N/A   Occupational History  . retired    Social History Main Topics  . Smoking status: Never Smoker   . Smokeless tobacco: Never Used  . Alcohol Use: No  . Drug Use: No  . Sexual Activity: Not on file   Other Topics Concern  . Not on file   Social History Narrative  . No narrative on file    ROS:  Positive for tiredness, a "drunk feeling", multiple stomach complaints. Otherwise as stated in the HPI and negative for all other systems.  PHYSICAL EXAM BP 147/80  Pulse 81  Ht 5\' 7"  (1.702 m)  Wt 178 lb (80.74 kg)  BMI 27.87 kg/m2 GENERAL:  Well appearing HEENT:  Pupils equal round and reactive, fundi not visualized, oral mucosa unremarkable NECK:  No jugular venous distention, waveform within normal limits, carotid upstroke brisk and symmetric, no bruits, transmitted systolic murmur no thyromegaly LYMPHATICS:  No cervical, inguinal adenopathy LUNGS:  Clear to auscultation bilaterally BACK:  No CVA tenderness CHEST:  Unremarkable HEART:  PMI not displaced or sustained,S1 and S2 within normal limits, no S3, no S4, no clicks, no rubs, 3/6 apical early mid peaking systolic murmur radiating out the aortic outflow tract, no diastolicmurmurs ABD:  Flat, positive bowel sounds normal in frequency in pitch, no bruits, no rebound, no guarding, no midline pulsatile mass, no hepatomegaly, no splenomegaly EXT:  2 plus pulses throughout, no edema, no cyanosis no  clubbing SKIN:  No rashes no nodules NEURO:  Cranial nerves II through XII grossly intact, motor grossly intact throughout PSYCH:  Cognitively intact, oriented to person place and time   EKG: sinus rhythm, rate 73, left axis deviation, poor anterior R wave progression, no acute ST-T wave changes. 04/07/2013   ASSESSMENT AND PLAN  AORTIC STENOSIS:  I will check an echocardiogram. I suspect this is probably moderate. Further management and evaluation will be based on this result.  FATIGUE:  I doubt that this is related to the aortic stenosis. She's not having any anginal symptoms to suggest this as a possibility. At this point I don't think further cardiovascular testing other than the ultrasound is indicated.  I don't see any recent thyroid testing and her laboratories [place], [place], MDBut I will defer to

## 2013-04-07 NOTE — Patient Instructions (Addendum)

## 2013-04-09 ENCOUNTER — Encounter: Payer: Self-pay | Admitting: Cardiology

## 2013-04-21 ENCOUNTER — Other Ambulatory Visit: Payer: Self-pay

## 2013-04-21 ENCOUNTER — Other Ambulatory Visit (INDEPENDENT_AMBULATORY_CARE_PROVIDER_SITE_OTHER): Payer: Medicare Other

## 2013-04-21 DIAGNOSIS — I359 Nonrheumatic aortic valve disorder, unspecified: Secondary | ICD-10-CM

## 2013-04-21 DIAGNOSIS — I059 Rheumatic mitral valve disease, unspecified: Secondary | ICD-10-CM

## 2013-04-26 ENCOUNTER — Ambulatory Visit (INDEPENDENT_AMBULATORY_CARE_PROVIDER_SITE_OTHER): Payer: Medicare Other

## 2013-04-26 DIAGNOSIS — Z23 Encounter for immunization: Secondary | ICD-10-CM

## 2013-06-25 ENCOUNTER — Other Ambulatory Visit: Payer: Self-pay | Admitting: Nurse Practitioner

## 2013-07-07 ENCOUNTER — Ambulatory Visit (INDEPENDENT_AMBULATORY_CARE_PROVIDER_SITE_OTHER): Payer: Medicare Other | Admitting: Family Medicine

## 2013-07-07 ENCOUNTER — Encounter: Payer: Self-pay | Admitting: Family Medicine

## 2013-07-07 VITALS — BP 134/78 | HR 89 | Temp 98.7°F | Ht 67.0 in | Wt 178.0 lb

## 2013-07-07 DIAGNOSIS — J309 Allergic rhinitis, unspecified: Secondary | ICD-10-CM

## 2013-07-07 DIAGNOSIS — J302 Other seasonal allergic rhinitis: Secondary | ICD-10-CM | POA: Insufficient documentation

## 2013-07-07 DIAGNOSIS — J209 Acute bronchitis, unspecified: Secondary | ICD-10-CM

## 2013-07-07 MED ORDER — AZITHROMYCIN 250 MG PO TABS: ORAL_TABLET | ORAL | Status: DC

## 2013-07-07 NOTE — Progress Notes (Signed)
Patient ID: Michelle [name], female   DOB: 1933/07/30, 79 y.o.   MRN: [id number] SUBJECTIVE: CC: Chief Complaint  Patient presents with  . Nasal Congestion    worsening, denies fever  . Cough    slightly prod, thick    HPI: Has a head cold.head stuffed up eyes watery and now in the chest. Woke up congested last night. Phlegm. Doesn't smoke. Temp of house = 67 degrees. 72 during the days. concernthat her sister had similar symptoms and ended up in the hospital with Pneumonia. No hemoptysis   Past Medical History  Diagnosis Date  . Duodenitis without mention of hemorrhage   . Esophagitis, unspecified   . Diverticulosis   . UTI (lower urinary tract infection)   . IBS (irritable bowel syndrome)   . Uterine cancer    Past Surgical History  Procedure Laterality Date  . Vaginal hysterectomy    . Foot surgery      bilateral    History   Social History  . Marital Status: Married    Spouse Name: N/A    Number of Children: 3  . Years of Education: N/A   Occupational History  . retired    Social History Main Topics  . Smoking status: Never Smoker   . Smokeless tobacco: Never Used  . Alcohol Use: No  . Drug Use: No  . Sexual Activity: Not on file   Other Topics Concern  . Not on file   Social History Narrative   Lives with husband.     Family History  Problem Relation Age of Onset  . Colon cancer      Per patient unsure of what cancer in family   . Heart disease Mother 43    CHF  . COPD Father   . Eczema Father    Current Outpatient Prescriptions on File Prior to Visit  Medication Sig Dispense Refill  . cetirizine (ZYRTEC ALLERGY) 10 MG tablet Take 10 mg by mouth. As needed      . Cholecalciferol (VITAMIN D3) 2000 UNITS TABS Take by mouth daily.        Marland Kitchen docusate sodium (COLACE) 100 MG capsule Take 100 mg by mouth. As needed      . fluticasone (FLONASE) 50 MCG/ACT nasal spray Place 2 sprays into the nose daily.  16 g  3  . hydrochlorothiazide (HYDRODIURIL) 25  MG tablet Take 1 tablet (25 mg total) by mouth daily.  30 tablet  5  . lisinopril (PRINIVIL,ZESTRIL) 20 MG tablet TAKE 1 TABLET ONCE A DAY  30 tablet  2  . pantoprazole (PROTONIX) 40 MG tablet Take 1 tablet (40 mg total) by mouth daily.  90 tablet  3  . Probiotic Product (ALIGN) 4 MG CAPS Take 1 capsule by mouth daily.        . Psyllium (METAMUCIL) 30.9 % POWD Take by mouth. At night      . Wheat Dextrin (BENEFIBER) POWD Take by mouth. Takes in the morning       No current facility-administered medications on file prior to visit.   Allergies  Allergen Reactions  . Nitrofurantoin   . Sulfonamide Derivatives    Immunization History  Administered Date(s) Administered  . Influenza,inj,Quad PF,36+ Mos 04/26/2013   Prior to Admission medications   Medication Sig Start Date End Date Taking? Authorizing Provider  cetirizine (ZYRTEC ALLERGY) 10 MG tablet Take 10 mg by mouth. As needed   Yes Historical Provider, MD  Cholecalciferol (VITAMIN D3) 2000 UNITS TABS Take  by mouth daily.     Yes Historical Provider, MD  docusate sodium (COLACE) 100 MG capsule Take 100 mg by mouth. As needed   Yes Historical Provider, MD  fluticasone (FLONASE) 50 MCG/ACT nasal spray Place 2 sprays into the nose daily. 11/09/12  Yes Ileana Ladd, MD  hydrochlorothiazide (HYDRODIURIL) 25 MG tablet Take 1 tablet (25 mg total) by mouth daily. 03/26/13  Yes Ernestina Penna, MD  lisinopril (PRINIVIL,ZESTRIL) 20 MG tablet TAKE 1 TABLET ONCE A DAY 06/25/13  Yes Deatra Canter, FNP  pantoprazole (PROTONIX) 40 MG tablet Take 1 tablet (40 mg total) by mouth daily. 02/11/13  Yes Mary-Margaret Daphine Deutscher, FNP  Probiotic Product (ALIGN) 4 MG CAPS Take 1 capsule by mouth daily.     Yes Historical Provider, MD  Psyllium (METAMUCIL) 30.9 % POWD Take by mouth. At night   Yes Historical Provider, MD  Wheat Dextrin (BENEFIBER) POWD Take by mouth. Takes in the morning   Yes Historical Provider, MD     ROS: As above in the HPI. All other  systems are stable or negative.  OBJECTIVE: APPEARANCE:  Patient in no acute distress.The patient appeared well nourished and normally developed. Acyanotic. Waist: VITAL SIGNS:BP 134/78  Pulse 89  Temp(Src) 98.7 F (37.1 C)  Ht 5\' 7"  (1.702 m)  Wt 178 lb (80.74 kg)  BMI 27.87 kg/m2 WF  SKIN: warm and  Dry without overt rashes, tattoos and scars  HEAD and Neck: without JVD, Head and scalp: normal Eyes:No scleral icterus. Fundi normal, eye movements normal.lacrimating Ears: Auricle normal, canal normal, Tympanic membranes normal, insufflation normal. Nose: coryza Throat: red Neck & thyroid: normal  CHEST & LUNGS: Chest wall: normal Lungs: bilateral Rhonchi   CVS: Reveals the PMI to be normally located. Regular rhythm, First and Second Heart sounds are normal,  absence of murmurs, rubs or gallops. Peripheral vasculature: Radial pulses: normal Dorsal pedis pulses: normal Posterior pulses: normal  ABDOMEN:  Appearance: normal Benign, no organomegaly, no masses, no Abdominal Aortic enlargement. No Guarding , no rebound. No Bruits. Bowel sounds: normal  RECTAL: N/A GU: N/A  EXTREMETIES: nonedematous.  MUSCULOSKELETAL:  Spine: normal Joints: intact  NEUROLOGIC: oriented to time,place and person; nonfocal. Strength is normal Sensory is normal Reflexes are normal Cranial Nerves are normal.  ASSESSMENT: Seasonal allergic rhinitis  Acute bronchitis  PLAN: Fluids rest hand washing Discussed appropriate use of antibiotics, however will go ahead and treat in view of symptoms for several weeks and getting worse. Allergen avoidance. Molds, etc. No orders of the defined types were placed in this encounter.   Meds ordered this encounter  Medications  . azithromycin (ZITHROMAX Z-PAK) 250 MG tablet    Sig: 2 tabs on day1, the 1 tab on days 2 to 5    Dispense:  6 each    Refill:  0   There are no discontinued medications. Return if symptoms worsen or fail to  improve.  Kyndel Egger P. Modesto Charon, M.D.

## 2013-08-26 ENCOUNTER — Other Ambulatory Visit: Payer: Self-pay | Admitting: Family Medicine

## 2013-09-18 ENCOUNTER — Other Ambulatory Visit: Payer: Self-pay | Admitting: Family Medicine

## 2013-10-28 ENCOUNTER — Other Ambulatory Visit: Payer: Self-pay | Admitting: Family Medicine

## 2013-10-29 NOTE — Telephone Encounter (Signed)
No more refills without being seen after this one.p

## 2013-11-08 ENCOUNTER — Other Ambulatory Visit: Payer: Self-pay | Admitting: Family Medicine

## 2013-11-09 NOTE — Telephone Encounter (Signed)
Last seen 03/18/13  B [place]

## 2013-12-07 ENCOUNTER — Other Ambulatory Visit: Payer: Self-pay | Admitting: Nurse Practitioner

## 2013-12-08 NOTE — Telephone Encounter (Signed)
No labs since 01/2013 

## 2013-12-09 NOTE — Telephone Encounter (Signed)
Patient NTBS for follow up and lab work  

## 2014-01-05 ENCOUNTER — Other Ambulatory Visit: Payer: Self-pay | Admitting: Family Medicine

## 2014-01-05 ENCOUNTER — Other Ambulatory Visit: Payer: Self-pay | Admitting: Nurse Practitioner

## 2014-01-05 ENCOUNTER — Ambulatory Visit (INDEPENDENT_AMBULATORY_CARE_PROVIDER_SITE_OTHER): Payer: Medicare Other | Admitting: Nurse Practitioner

## 2014-01-05 VITALS — BP 142/70 | HR 68 | Temp 97.8°F | Ht 67.0 in | Wt 178.0 lb

## 2014-01-05 DIAGNOSIS — R319 Hematuria, unspecified: Secondary | ICD-10-CM

## 2014-01-05 DIAGNOSIS — R350 Frequency of micturition: Secondary | ICD-10-CM

## 2014-01-05 DIAGNOSIS — R300 Dysuria: Secondary | ICD-10-CM

## 2014-01-05 DIAGNOSIS — N390 Urinary tract infection, site not specified: Secondary | ICD-10-CM

## 2014-01-05 LAB — POCT UA - MICROSCOPIC ONLY
Casts, Ur, LPF, POC: NEGATIVE
Crystals, Ur, HPF, POC: NEGATIVE
Mucus, UA: NEGATIVE
Yeast, UA: NEGATIVE

## 2014-01-05 LAB — POCT URINALYSIS DIPSTICK
BILIRUBIN UA: NEGATIVE
GLUCOSE UA: NEGATIVE
KETONES UA: NEGATIVE
Nitrite, UA: POSITIVE
Spec Grav, UA: <=1.005
Urobilinogen, UA: NEGATIVE
pH, UA: 8

## 2014-01-05 MED ORDER — CIPROFLOXACIN HCL 500 MG PO TABS: 500.0000 mg | ORAL_TABLET | Freq: Two times a day (BID) | ORAL | Status: DC

## 2014-01-05 NOTE — Progress Notes (Signed)
   Subjective:    Patient ID: Michelle [name], female    DOB: May 03, 1934, 80 y.o.   MRN: [id number]  HPI Patinet here today c/o dysuria that satrted 2 days ago- urinary frequency and urgency.    Review of Systems  Genitourinary: Positive for dysuria, urgency and frequency. Negative for hematuria.  All other systems reviewed and are negative.      Objective:   Physical Exam  Constitutional: She is oriented to person, place, and time. She appears well-developed and well-nourished.  Cardiovascular: Normal rate, regular rhythm and normal heart sounds.   Pulmonary/Chest: Effort normal and breath sounds normal.  Abdominal: Soft. There is tenderness (mild suprapubic pain on palpation).  Genitourinary:  CVA tenderness  Neurological: She is alert and oriented to person, place, and time.  Skin: Skin is warm and dry.  Psychiatric: She has a normal mood and affect. Her behavior is normal. Judgment and thought content normal.   BP 142/70  Pulse 68  Temp(Src) 97.8 F (36.6 C) (Oral)  Ht 5\' 7"  (1.702 m)  Wt 178 lb (80.74 kg)  BMI 27.87 kg/m2 Results for orders placed in visit on 01/05/14  POCT UA - MICROSCOPIC ONLY      Result Value Ref Range   WBC, Ur, HPF, POC 80-150     RBC, urine, microscopic 5-10     Bacteria, U Microscopic few     Mucus, UA neg     Epithelial cells, urine per micros few     Crystals, Ur, HPF, POC neg     Casts, Ur, LPF, POC neg     Yeast, UA neg    POCT URINALYSIS DIPSTICK      Result Value Ref Range   Color, UA gold     Clarity, UA clear     Glucose, UA neg     Bilirubin, UA neg     Ketones, UA neg     Spec Grav, UA <=1.005     Blood, UA mod     pH, UA 8.0     Protein, UA 1+     Urobilinogen, UA negative     Nitrite, UA pos     Leukocytes, UA large (3+)            Assessment & Plan:   1. Frequent urination   2. Dysuria   3. Urinary tract infection with hematuria, site unspecified    Meds ordered this encounter  Medications  .  ciprofloxacin (CIPRO) 500 MG tablet    Sig: Take 1 tablet (500 mg total) by mouth 2 (two) times daily.    Dispense:  14 tablet    Refill:  0    Order Specific Question:  Supervising Provider    Answer:  Joycelyn Man   Force fluids AZO over the counter X2 days RTO prn Culture pending  Mary-Margaret Hassell Done, FNP

## 2014-01-05 NOTE — Patient Instructions (Signed)
Asymptomatic Bacteriuria °Asymptomatic bacteriuria is the presence of a large number of bacteria in your urine without the usual symptoms of burning or frequent urination. The following conditions increase the risk of asymptomatic bacteriuria: °· Diabetes mellitus. °· Advanced age. °· Pregnancy in the first trimester. °· Kidney stones. °· Kidney transplants. °· Leaky kidney tube valve in young children (reflux). °Treatment for this condition is not needed in most people and can lead to other problems such as too much yeast and growth of resistant bacteria. However, some people, such as pregnant women, do need treatment to prevent kidney infection. Asymptomatic bacteriuria in pregnancy is also associated with fetal growth restriction, premature labor, and newborn death. °HOME CARE INSTRUCTIONS °Monitor your condition for any changes. The following actions may help to relieve any discomfort you are feeling: °· Drink enough water and fluids to keep your urine clear or pale yellow. Go to the bathroom more often to keep your bladder empty. °· Keep the area around your vagina and rectum clean. Wipe yourself from front to back after urinating. °SEEK IMMEDIATE MEDICAL CARE IF: °· You develop signs of an infection such as: °¨ Burning with urination. °¨ Frequency of voiding. °¨ Back pain. °¨ Fever. °· You have blood in the urine. °· You develop a fever. °MAKE SURE YOU: °· Understand these instructions. °· Will watch your condition. °· Will get help right away if you are not doing well or get worse. °Document Released: 07/01/2005 Document Revised: 07/06/2013 Document Reviewed: 12/21/2012 °ExitCare® Patient Information ©2015 [employer]. This information is not intended to replace advice given to you by your health care provider. Make sure you discuss any questions you have with your health care provider. ° °

## 2014-01-07 LAB — URINE CULTURE

## 2014-01-09 ENCOUNTER — Other Ambulatory Visit: Payer: Self-pay | Admitting: Nurse Practitioner

## 2014-01-09 MED ORDER — SULFAMETHOXAZOLE-TMP DS 800-160 MG PO TABS: 1.0000 | ORAL_TABLET | Freq: Two times a day (BID) | ORAL | Status: DC

## 2014-01-27 ENCOUNTER — Ambulatory Visit (INDEPENDENT_AMBULATORY_CARE_PROVIDER_SITE_OTHER): Payer: Medicare Other | Admitting: Nurse Practitioner

## 2014-01-27 ENCOUNTER — Encounter: Payer: Self-pay | Admitting: Nurse Practitioner

## 2014-01-27 VITALS — BP 132/82 | HR 74 | Temp 99.0°F | Resp 18 | Wt 181.8 lb

## 2014-01-27 DIAGNOSIS — J3089 Other allergic rhinitis: Secondary | ICD-10-CM

## 2014-01-27 DIAGNOSIS — R300 Dysuria: Secondary | ICD-10-CM

## 2014-01-27 DIAGNOSIS — N3090 Cystitis, unspecified without hematuria: Secondary | ICD-10-CM

## 2014-01-27 LAB — POCT URINALYSIS DIPSTICK
Bilirubin, UA: NEGATIVE
Glucose, UA: NEGATIVE
KETONES UA: NEGATIVE
Leukocytes, UA: NEGATIVE
Nitrite, UA: NEGATIVE
PROTEIN UA: NEGATIVE
RBC UA: NEGATIVE
Spec Grav, UA: <=1.005
UROBILINOGEN UA: NEGATIVE
pH, UA: 7

## 2014-01-27 LAB — POCT UA - MICROSCOPIC ONLY
CRYSTALS, UR, HPF, POC: NEGATIVE
Casts, Ur, LPF, POC: NEGATIVE
Mucus, UA: NEGATIVE
RBC, URINE, MICROSCOPIC: NEGATIVE

## 2014-01-27 MED ORDER — FLUCONAZOLE 150 MG PO TABS: 150.0000 mg | ORAL_TABLET | Freq: Once | ORAL | Status: DC

## 2014-01-27 NOTE — Patient Instructions (Signed)

## 2014-01-27 NOTE — Progress Notes (Signed)
   Subjective:    Patient ID: Michelle [name], female    DOB: 1934/04/15, 80 y.o.   MRN: [id number]  HPI Patient is here today for a UTI follow up she was started on cipro then change to Bactrim DS. She completed the medication. She denies any dysuria but reports frequency. Patient has been told in the past that she she has cystitis. She is also c/o burnong eyes and sneezing.  Review of Systems  Constitutional: Negative.   Eyes: Negative.   Endocrine: Negative.   Genitourinary: Positive for frequency.       History of cystitis.   Allergic/Immunologic: Positive for environmental allergies.  Neurological: Negative.   Hematological: Negative.   Psychiatric/Behavioral: Negative.        Objective:   Physical Exam  Constitutional: She is oriented to person, place, and time. She appears well-developed and well-nourished.  HENT:  Head: Normocephalic.  Right Ear: Hearing, tympanic membrane, external ear and ear canal normal.  Left Ear: Hearing, tympanic membrane, external ear and ear canal normal.  Nose: Nose normal.  Mouth/Throat: Uvula is midline, oropharynx is clear and moist and mucous membranes are normal.  Eyes: Conjunctivae and EOM are normal. Pupils are equal, round, and reactive to light.  Neck: Normal range of motion. Neck supple. No JVD present. No thyromegaly present.  Cardiovascular: Normal rate, normal heart sounds and intact distal pulses.   No murmur heard. Pulmonary/Chest: Effort normal and breath sounds normal. She has no wheezes. She has no rales.  Abdominal: Soft. Bowel sounds are normal. She exhibits no mass.  Genitourinary:  Urinary frequency.   Musculoskeletal: Normal range of motion.  Neurological: She is alert and oriented to person, place, and time. She has normal reflexes.  Skin: Skin is warm and dry.  Psychiatric: She has a normal mood and affect. Her behavior is normal. Judgment and thought content normal.     BP 132/82  Pulse 74  Temp(Src) 99 F (37.2  C)  Resp 18  Wt 181 lb 12.8 oz (82.464 kg)  Results for orders placed in visit on 01/27/14  POCT URINALYSIS DIPSTICK      Result Value Ref Range   Color, UA yellow     Clarity, UA clear     Glucose, UA neg     Bilirubin, UA neg     Ketones, UA neg     Spec Grav, UA <=1.005     Blood, UA neg     pH, UA 7.0     Protein, UA neg     Urobilinogen, UA negative     Nitrite, UA neg     Leukocytes, UA Negative    POCT UA - MICROSCOPIC ONLY      Result Value Ref Range   WBC, Ur, HPF, POC 3-4     RBC, urine, microscopic neg     Bacteria, U Microscopic occ     Mucus, UA neg     Epithelial cells, urine per micros occ     Crystals, Ur, HPF, POC neg     Casts, Ur, LPF, POC neg          Assessment & Plan:  1. Dysuria Force fluids DIFLUCAN 150 mg now - POCT urinalysis dipstick - POCT UA - Microscopic Only  2. Cystitis Avoid aciditic drinks  3. Environmental and seasonal allergies Stop zyrtec Try claritin or allegra for awhile RTO prn  Mary-Margaret Hassell Done, FNP

## 2014-02-04 ENCOUNTER — Other Ambulatory Visit: Payer: Self-pay | Admitting: Nurse Practitioner

## 2014-03-10 ENCOUNTER — Other Ambulatory Visit: Payer: Self-pay | Admitting: *Deleted

## 2014-03-10 MED ORDER — FLUTICASONE PROPIONATE 50 MCG/ACT NA SUSP: NASAL | Status: DC

## 2014-03-12 ENCOUNTER — Encounter: Payer: Self-pay | Admitting: Gastroenterology

## 2014-03-31 ENCOUNTER — Encounter: Payer: Self-pay | Admitting: Family Medicine

## 2014-03-31 ENCOUNTER — Ambulatory Visit (INDEPENDENT_AMBULATORY_CARE_PROVIDER_SITE_OTHER): Payer: Medicare Other | Admitting: Family Medicine

## 2014-03-31 VITALS — BP 142/79 | HR 71 | Temp 97.2°F | Ht 67.0 in | Wt 179.0 lb

## 2014-03-31 DIAGNOSIS — R300 Dysuria: Secondary | ICD-10-CM

## 2014-03-31 DIAGNOSIS — R319 Hematuria, unspecified: Secondary | ICD-10-CM

## 2014-03-31 DIAGNOSIS — N390 Urinary tract infection, site not specified: Secondary | ICD-10-CM

## 2014-03-31 LAB — POCT UA - MICROSCOPIC ONLY
Bacteria, U Microscopic: NEGATIVE
Casts, Ur, LPF, POC: NEGATIVE
Crystals, Ur, HPF, POC: NEGATIVE
Mucus, UA: NEGATIVE
Yeast, UA: NEGATIVE

## 2014-03-31 LAB — POCT URINALYSIS DIPSTICK
Bilirubin, UA: NEGATIVE
Glucose, UA: NEGATIVE
Ketones, UA: NEGATIVE
Leukocytes, UA: NEGATIVE
Nitrite, UA: NEGATIVE
Protein, UA: NEGATIVE
Spec Grav, UA: <=1.005
Urobilinogen, UA: NEGATIVE
pH, UA: 7.5

## 2014-03-31 MED ORDER — CIPROFLOXACIN HCL 500 MG PO TABS: 500.0000 mg | ORAL_TABLET | Freq: Two times a day (BID) | ORAL | Status: DC

## 2014-03-31 NOTE — Progress Notes (Signed)
   Subjective:    Patient ID: Michelle [name], female    DOB: July 10, 1934, 80 y.o.   MRN: [id number]  HPI This 80 y.o. female presents for evaluation of dysuria.   Review of Systems No chest pain, SOB, HA, dizziness, vision change, N/V, diarrhea, constipation, dysuria, urinary urgency or frequency, myalgias, arthralgias or rash.     Objective:   Physical Exam  Vital signs noted  Well developed well nourished female.  HEENT - Head atraumatic Normocephalic                Eyes - PERRLA, Conjuctiva - clear Sclera- Clear EOMI                Ears - EAC's Wnl TM's Wnl Gross Hearing WNL                Throat - oropharanx wnl Respiratory - Lungs CTA bilateral Cardiac - RRR S1 and S2 without murmur GI - Abdomen soft Nontender and bowel sounds active x 4 Extremities - No edema. Neuro - Grossly intact.  Results for orders placed in visit on 03/31/14  POCT UA - MICROSCOPIC ONLY      Result Value Ref Range   WBC, Ur, HPF, POC 5-10     RBC, urine, microscopic 1-5     Bacteria, U Microscopic NEG     Mucus, UA NEG     Epithelial cells, urine per micros OCC     Crystals, Ur, HPF, POC NEG     Casts, Ur, LPF, POC NEG     Yeast, UA NEG    POCT URINALYSIS DIPSTICK      Result Value Ref Range   Color, UA GOLD     Clarity, UA CLEAR     Glucose, UA NEG     Bilirubin, UA NEG     Ketones, UA NEG     Spec Grav, UA <=1.005     Blood, UA TRACE     pH, UA 7.5     Protein, UA NEG     Urobilinogen, UA negative     Nitrite, UA NEG     Leukocytes, UA Negative        Assessment & Plan:  Dysuria - Plan: POCT UA - Microscopic Only, POCT urinalysis dipstick, ciprofloxacin (CIPRO) 500 MG tablet, Urine culture  Urinary tract infection with hematuria, site unspecified - Plan: ciprofloxacin (CIPRO) 500 MG tablet, Urine culture  Lysbeth Penner FNP

## 2014-04-04 LAB — URINE CULTURE

## 2014-05-09 ENCOUNTER — Other Ambulatory Visit: Payer: Self-pay | Admitting: Nurse Practitioner

## 2014-05-11 ENCOUNTER — Encounter: Payer: Self-pay | Admitting: Cardiology

## 2014-05-11 ENCOUNTER — Ambulatory Visit (INDEPENDENT_AMBULATORY_CARE_PROVIDER_SITE_OTHER): Payer: Medicare Other | Admitting: Cardiology

## 2014-05-11 VITALS — BP 144/70 | HR 76 | Ht 67.0 in | Wt 183.0 lb

## 2014-05-11 DIAGNOSIS — I10 Essential (primary) hypertension: Secondary | ICD-10-CM

## 2014-05-11 MED ORDER — LOSARTAN POTASSIUM 50 MG PO TABS: 50.0000 mg | ORAL_TABLET | Freq: Every day | ORAL | Status: DC

## 2014-05-11 NOTE — Patient Instructions (Signed)
Please finish the Lisinopril you have then switch to Losartan 50 mg a day.  Continue all other medications as listed.  Follow up in 18 months with Dr. Percival Spanish in [place].  You will receive a letter in the mail 2 months before you are due.  Please call us when you receive this letter to schedule your follow up appointment.

## 2014-05-11 NOTE — Progress Notes (Signed)
HPI The patient presents for evaluation. She has some mild aortic stenosis.   Since I last saw her she has had continued problems with your irritable bowel. She also describes sinus issues, congestion and cough.  She does not describe PND or orthopnea however. She does not have palpitations, presyncope syncope. She has no weight gain or edema.  She denies any chest pressure, neck or arm discomfort. She's had no weight gain or edema.  Allergies  Allergen Reactions  . Nitrofurantoin   . Sulfonamide Derivatives     Current Outpatient Prescriptions  Medication Sig Dispense Refill  . cetirizine (ZYRTEC ALLERGY) 10 MG tablet Take 10 mg by mouth. As needed      . Cholecalciferol (VITAMIN D3) 2000 UNITS TABS Take by mouth daily.        Marland Kitchen docusate sodium (COLACE) 100 MG capsule Take 100 mg by mouth. As needed      . fluticasone (FLONASE) 50 MCG/ACT nasal spray 2 SPRAYS NASALLY ONCE A DAY AS DIRECTED  16 g  2  . hydrochlorothiazide (HYDRODIURIL) 25 MG tablet TAKE 1 TABLET DAILY  30 tablet  5  . lisinopril (PRINIVIL,ZESTRIL) 20 MG tablet TAKE 1 TABLET ONCE A DAY  30 tablet  5  . pantoprazole (PROTONIX) 40 MG tablet TAKE 1 TABLET ONCE A DAY  30 tablet  4  . Probiotic Product (ALIGN) 4 MG CAPS Take 1 capsule by mouth as needed.       . Psyllium (METAMUCIL) 30.9 % POWD Take by mouth. At night      . Wheat Dextrin (BENEFIBER) POWD Take by mouth. Takes in the morning       No current facility-administered medications for this visit.    Past Medical History  Diagnosis Date  . Duodenitis without mention of hemorrhage   . Esophagitis, unspecified   . Diverticulosis   . UTI (lower urinary tract infection)   . IBS (irritable bowel syndrome)   . Uterine cancer     Past Surgical History  Procedure Laterality Date  . Vaginal hysterectomy    . Foot surgery      bilateral     ROS:  As stated in the HPI and negative for all other systems.  PHYSICAL EXAM BP 144/70  Pulse 76  Ht 5\' 7"  (1.702  m)  Wt 183 lb (83.008 kg)  BMI 28.66 kg/m2 GENERAL:  Well appearing HEENT:  Pupils equal round and reactive, fundi not visualized, oral mucosa unremarkable NECK:  No jugular venous distention, waveform within normal limits, carotid upstroke brisk and symmetric, no bruits, transmitted systolic murmur no thyromegaly LYMPHATICS:  No cervical, inguinal adenopathy LUNGS:  Clear to auscultation bilaterally BACK:  No CVA tenderness CHEST:  Unremarkable HEART:  PMI not displaced or sustained,S1 and S2 within normal limits, no S3, no S4, no clicks, no rubs, 3/6 apical early mid peaking systolic murmur radiating out the aortic outflow tract, no diastolicmurmurs ABD:  Flat, positive bowel sounds normal in frequency in pitch, no bruits, no rebound, no guarding, no midline pulsatile mass, no hepatomegaly, no splenomegaly EXT:  2 plus pulses throughout, no edema, no cyanosis no clubbing SKIN:  No rashes no nodules NEURO:  Cranial nerves II through XII grossly intact, motor grossly intact throughout PSYCH:  Cognitively intact, oriented to person place and time   EKG: sinus rhythm, rate 76, left axis deviation, poor anterior R wave progression, no acute ST-T wave changes. 05/11/2014   ASSESSMENT AND PLAN  AORTIC STENOSIS:  Her aortic  stenosis was mild to moderate on echo last year.  I would not suspect that this was worse.  No further imaging is indicated.   FATIGUE:  I see no cardiac etiology to this. She is to see her primary physician and I do not see a recent TSH her CBC. I suspect she'll have the full workup.  I did review previous labs.  COUGH:  She describes cough and loss of sinus problems. I'm going to switch her to Cozaar 50 mg daily. He would stop the lisinopril. We can see if this makes a difference if any of her symptoms.  HTN:  As above.

## 2014-06-02 ENCOUNTER — Ambulatory Visit (INDEPENDENT_AMBULATORY_CARE_PROVIDER_SITE_OTHER): Payer: Medicare Other

## 2014-06-02 DIAGNOSIS — Z23 Encounter for immunization: Secondary | ICD-10-CM

## 2014-07-04 ENCOUNTER — Other Ambulatory Visit: Payer: Self-pay | Admitting: Nurse Practitioner

## 2014-07-12 ENCOUNTER — Encounter: Payer: Self-pay | Admitting: Family Medicine

## 2014-07-12 ENCOUNTER — Ambulatory Visit (INDEPENDENT_AMBULATORY_CARE_PROVIDER_SITE_OTHER): Payer: Medicare Other | Admitting: Family Medicine

## 2014-07-12 VITALS — BP 161/81 | HR 80 | Temp 97.6°F | Ht 67.0 in | Wt 182.0 lb

## 2014-07-12 DIAGNOSIS — N390 Urinary tract infection, site not specified: Secondary | ICD-10-CM

## 2014-07-12 DIAGNOSIS — R350 Frequency of micturition: Secondary | ICD-10-CM

## 2014-07-12 DIAGNOSIS — R300 Dysuria: Secondary | ICD-10-CM

## 2014-07-12 LAB — POCT UA - MICROSCOPIC ONLY
Bacteria, U Microscopic: NEGATIVE
Casts, Ur, LPF, POC: NEGATIVE
Crystals, Ur, HPF, POC: NEGATIVE
Mucus, UA: NEGATIVE
Yeast, UA: NEGATIVE

## 2014-07-12 LAB — POCT URINALYSIS DIPSTICK
Bilirubin, UA: NEGATIVE
Glucose, UA: NEGATIVE
Ketones, UA: NEGATIVE
Nitrite, UA: NEGATIVE
Protein, UA: NEGATIVE
Spec Grav, UA: 1.01
Urobilinogen, UA: NEGATIVE
pH, UA: 7

## 2014-07-12 MED ORDER — PHENAZOPYRIDINE HCL 200 MG PO TABS: 200.0000 mg | ORAL_TABLET | Freq: Three times a day (TID) | ORAL | Status: DC | PRN

## 2014-07-12 MED ORDER — CIPROFLOXACIN HCL 500 MG PO TABS: 500.0000 mg | ORAL_TABLET | Freq: Two times a day (BID) | ORAL | Status: DC

## 2014-07-12 NOTE — Progress Notes (Signed)
   Subjective:    Patient ID: Michelle [name], female    DOB: 1933/09/14, 80 y.o.   MRN: [id number]  HPI   Review of Systems  Constitutional: Negative for fever.  HENT: Negative for ear pain.   Eyes: Negative for discharge.  Respiratory: Negative for cough.   Cardiovascular: Negative for chest pain.  Gastrointestinal: Negative for abdominal distention.  Endocrine: Negative for polyuria.  Genitourinary: Positive for dysuria and frequency. Negative for difficulty urinating.  Musculoskeletal: Negative for gait problem and neck pain.  Skin: Negative for color change and rash.  Neurological: Negative for speech difficulty and headaches.  Psychiatric/Behavioral: Negative for agitation.       Objective:    BP 161/81 mmHg  Pulse 80  Temp(Src) 97.6 F (36.4 C) (Oral)  Ht 5\' 7"  (1.702 m)  Wt 182 lb (82.555 kg)  BMI 28.50 kg/m2 Physical Exam  Constitutional: She is oriented to person, place, and time. She appears well-developed and well-nourished.  HENT:  Head: Normocephalic and atraumatic.  Mouth/Throat: Oropharynx is clear and moist.  Eyes: Pupils are equal, round, and reactive to light.  Neck: Normal range of motion. Neck supple.  Cardiovascular: Normal rate and regular rhythm.   No murmur heard. Pulmonary/Chest: Effort normal and breath sounds normal.  Abdominal: Soft. Bowel sounds are normal. There is no tenderness.  Neurological: She is alert and oriented to person, place, and time.  Skin: Skin is warm and dry.  Psychiatric: She has a normal mood and affect.     Results for orders placed or performed in visit on 07/12/14  POCT urinalysis dipstick  Result Value Ref Range   Color, UA yellow    Clarity, UA cloudy    Glucose, UA neg    Bilirubin, UA neg    Ketones, UA neg    Spec Grav, UA 1.010    Blood, UA trace    pH, UA 7.0    Protein, UA neg    Urobilinogen, UA negative    Nitrite, UA neg    Leukocytes, UA moderate (2+)   POCT UA - Microscopic Only  Result  Value Ref Range   WBC, Ur, HPF, POC 25-30    RBC, urine, microscopic 5-10    Bacteria, U Microscopic neg    Mucus, UA neg    Epithelial cells, urine per micros occ    Crystals, Ur, HPF, POC neg    Casts, Ur, LPF, POC neg    Yeast, UA neg        Assessment & Plan:     ICD-9-CM ICD-10-CM   1. Dysuria 788.1 R30.0 POCT urinalysis dipstick     POCT UA - Microscopic Only     Urine culture     ciprofloxacin (CIPRO) 500 MG tablet     phenazopyridine (PYRIDIUM) 200 MG tablet  2. Urinary frequency 788.41 R35.0 POCT urinalysis dipstick     POCT UA - Microscopic Only     Urine culture     ciprofloxacin (CIPRO) 500 MG tablet     phenazopyridine (PYRIDIUM) 200 MG tablet  3. Urinary tract infection without hematuria, site unspecified 599.0 N39.0 ciprofloxacin (CIPRO) 500 MG tablet     phenazopyridine (PYRIDIUM) 200 MG tablet     No Follow-up on file.  Lysbeth Penner FNP

## 2014-07-15 LAB — URINE CULTURE

## 2014-09-23 ENCOUNTER — Other Ambulatory Visit: Payer: Self-pay | Admitting: *Deleted

## 2014-09-23 ENCOUNTER — Other Ambulatory Visit (INDEPENDENT_AMBULATORY_CARE_PROVIDER_SITE_OTHER): Payer: Medicare Other

## 2014-09-23 ENCOUNTER — Other Ambulatory Visit: Payer: Self-pay | Admitting: Nurse Practitioner

## 2014-09-23 DIAGNOSIS — N390 Urinary tract infection, site not specified: Secondary | ICD-10-CM

## 2014-09-23 DIAGNOSIS — R350 Frequency of micturition: Secondary | ICD-10-CM | POA: Diagnosis not present

## 2014-09-23 DIAGNOSIS — R300 Dysuria: Secondary | ICD-10-CM

## 2014-09-23 LAB — POCT UA - MICROSCOPIC ONLY
Bacteria, U Microscopic: NEGATIVE
CRYSTALS, UR, HPF, POC: NEGATIVE
Casts, Ur, LPF, POC: NEGATIVE
YEAST UA: NEGATIVE

## 2014-09-23 LAB — POCT URINALYSIS DIPSTICK
BILIRUBIN UA: NEGATIVE
GLUCOSE UA: NEGATIVE
KETONES UA: NEGATIVE
Nitrite, UA: NEGATIVE
PH UA: 8.5
Spec Grav, UA: <=1.005
Urobilinogen, UA: NEGATIVE

## 2014-09-23 MED ORDER — CIPROFLOXACIN HCL 500 MG PO TABS: 500.0000 mg | ORAL_TABLET | Freq: Two times a day (BID) | ORAL | Status: DC

## 2014-09-24 LAB — URINE CULTURE

## 2014-09-29 ENCOUNTER — Other Ambulatory Visit: Payer: Self-pay | Admitting: Family Medicine

## 2014-10-04 ENCOUNTER — Other Ambulatory Visit: Payer: Self-pay | Admitting: Family Medicine

## 2014-10-21 ENCOUNTER — Telehealth: Payer: Self-pay | Admitting: Family Medicine

## 2014-10-21 NOTE — Telephone Encounter (Signed)
Pt given appt tomorrow at 85. Offered appts this afternoon but pt wanted something before lunch.

## 2014-10-22 ENCOUNTER — Ambulatory Visit (INDEPENDENT_AMBULATORY_CARE_PROVIDER_SITE_OTHER): Payer: Medicare Other | Admitting: Family Medicine

## 2014-10-22 VITALS — BP 163/83 | HR 80 | Temp 98.4°F | Ht 67.0 in | Wt 182.2 lb

## 2014-10-22 DIAGNOSIS — J329 Chronic sinusitis, unspecified: Secondary | ICD-10-CM

## 2014-10-22 DIAGNOSIS — J40 Bronchitis, not specified as acute or chronic: Secondary | ICD-10-CM | POA: Diagnosis not present

## 2014-10-22 MED ORDER — BETAMETHASONE SOD PHOS & ACET 6 (3-3) MG/ML IJ SUSP
6.0000 mg | Freq: Once | INTRAMUSCULAR | Status: AC
Start: 2014-10-22 — End: 2014-10-22
  Administered 2014-10-22: 6 mg via INTRAMUSCULAR

## 2014-10-22 MED ORDER — AMOXICILLIN-POT CLAVULANATE 875-125 MG PO TABS: 1.0000 | ORAL_TABLET | Freq: Two times a day (BID) | ORAL | Status: DC

## 2014-10-22 MED ORDER — PSEUDOEPHEDRINE-GUAIFENESIN ER 60-600 MG PO TB12: 1.0000 | ORAL_TABLET | Freq: Two times a day (BID) | ORAL | Status: DC | PRN

## 2014-10-22 NOTE — Progress Notes (Signed)
Subjective:  Patient ID: DIAMOND JENTZ, female    DOB: Oct 15, 1933  Age: 81 y.o. MRN: [id number]  CC: Cough; chest and head congestion; and Sinusitis   HPI SARRA RACHELS presents for Symptoms include congestion, facial pain, nasal congestion, no  fever, non productive cough, post nasal drip and sinus pressure with no fever, chills, night sweats or weight loss. Onset of symptoms was a few days ago, gradually worsening since that time. Pt.is drinking moderate amounts of fluids.     History Darsha has a past medical history of Duodenitis without mention of hemorrhage; Esophagitis, unspecified; Diverticulosis; IBS (irritable bowel syndrome); Uterine cancer; and Aortic stenosis.   She has past surgical history that includes Vaginal hysterectomy and Foot surgery.   Her family history includes COPD in her father; Colon cancer in an other family member; Eczema in her father; Heart disease (age of onset: 22) in her mother.She reports that she has never smoked. She has never used smokeless tobacco. She reports that she does not drink alcohol or use illicit drugs.  Current Outpatient Prescriptions on File Prior to Visit  Medication Sig Dispense Refill  . cetirizine (ZYRTEC ALLERGY) 10 MG tablet Take 10 mg by mouth. As needed    . Cholecalciferol (VITAMIN D3) 2000 UNITS TABS Take by mouth daily.      Marland Kitchen docusate sodium (COLACE) 100 MG capsule Take 100 mg by mouth. As needed    . fluticasone (FLONASE) 50 MCG/ACT nasal spray 2 SPRAYS NASALLY ONCE A DAY AS DIRECTED 16 g 2  . hydrochlorothiazide (HYDRODIURIL) 25 MG tablet TAKE 1 TABLET DAILY 30 tablet 2  . losartan (COZAAR) 50 MG tablet Take 1 tablet (50 mg total) by mouth daily. 30 tablet 11  . pantoprazole (PROTONIX) 40 MG tablet TAKE 1 TABLET ONCE A DAY 30 tablet 4  . phenazopyridine (PYRIDIUM) 200 MG tablet Take 1 tablet (200 mg total) by mouth 3 (three) times daily as needed for pain. 10 tablet 0  . Probiotic Product (ALIGN) 4 MG CAPS Take 1 capsule  by mouth as needed.     . Psyllium (METAMUCIL) 30.9 % POWD Take by mouth. At night    . Wheat Dextrin (BENEFIBER) POWD Take by mouth. Takes in the morning     No current facility-administered medications on file prior to visit.    ROS Review of Systems  Constitutional: Negative for fever, chills, activity change and appetite change.  HENT: Positive for congestion, postnasal drip, rhinorrhea and sinus pressure. Negative for ear discharge, ear pain, hearing loss, nosebleeds, sneezing and trouble swallowing.   Respiratory: Negative for chest tightness and shortness of breath.   Cardiovascular: Negative for chest pain and palpitations.  Skin: Negative for rash.    Objective:  BP 163/83 mmHg  Pulse 80  Temp(Src) 98.4 F (36.9 C) (Oral)  Ht 5\' 7"  (1.702 m)  Wt 182 lb 3.2 oz (82.645 kg)  BMI 28.53 kg/m2  BP Readings from Last 3 Encounters:  10/22/14 163/83  07/12/14 161/81  05/11/14 144/70    Wt Readings from Last 3 Encounters:  10/22/14 182 lb 3.2 oz (82.645 kg)  07/12/14 182 lb (82.555 kg)  05/11/14 183 lb (83.008 kg)     Physical Exam  Constitutional: She appears well-developed and well-nourished.  HENT:  Head: Normocephalic and atraumatic.  Right Ear: Tympanic membrane and external ear normal. No decreased hearing is noted.  Left Ear: Tympanic membrane and external ear normal. No decreased hearing is noted.  Nose: Mucosal edema present. Right  sinus exhibits no frontal sinus tenderness. Left sinus exhibits no frontal sinus tenderness.  Mouth/Throat: Oropharyngeal exudate present. No posterior oropharyngeal erythema.  Eyes: Conjunctivae are normal.  Neck: Normal range of motion. No Brudzinski's sign noted.  Pulmonary/Chest: No stridor. No respiratory distress. She has wheezes. She has no rales. She exhibits no tenderness.  Lymphadenopathy:       Head (right side): No preauricular adenopathy present.       Head (left side): No preauricular adenopathy present.    She has  no cervical adenopathy.       Right cervical: No superficial cervical adenopathy present.      Left cervical: No superficial cervical adenopathy present.    No results found for: HGBA1C  Lab Results  Component Value Date   WBC 6.8 11/21/2009   HGB 12.2 11/21/2009   HCT 36.0 11/21/2009   PLT 260.0 11/21/2009   GLUCOSE 98 02/11/2013   CHOL 260* 02/11/2013   TRIG 74 02/11/2013   HDL 76 02/11/2013   LDLCALC 169* 02/11/2013   ALT 10 02/11/2013   AST 18 02/11/2013   NA 142 02/11/2013   K 4.0 02/11/2013   CL 100 02/11/2013   CREATININE 0.68 02/11/2013   BUN 14 02/11/2013   CO2 28 02/11/2013   TSH 1.46 10/22/2006    Nm Gastric Emptying  12/07/2010   *RADIOLOGY REPORT*  Clinical Data:  Evaluate for gastroparesis.  NUCLEAR MEDICINE GASTRIC EMPTYING SCAN  Technique:  After oral ingestion of radiolabeled meal, sequential abdominal images were obtained for 120 minutes.  Residual percentage of activity remaining within the stomach was calculated at 60 and 120 minutes.  Radiopharmaceutical: 2.1 mCi Tc-31m sulfur colloid.  Comparison:  None.  Findings: The radiopharmaceutical was identified within the stomach on the immediate, 60-minute and 120-minute images.  There is a 58% retention at 120 minutes.  Normal retention is less than 30%.  IMPRESSION: Delayed gastric emptying.  58% retention at 120 minutes.  Original Report Authenticated By: Markus Daft, M.D.   Assessment & Plan:   Morenike was seen today for cough, chest and head congestion and sinusitis.  Diagnoses and all orders for this visit:  Sinobronchitis Orders: -     betamethasone acetate-betamethasone sodium phosphate (CELESTONE) injection 6 mg; Inject 1 mL (6 mg total) into the muscle once.  Other orders -     amoxicillin-clavulanate (AUGMENTIN) 875-125 MG per tablet; Take 1 tablet by mouth 2 (two) times daily. Take all of this medication -     pseudoephedrine-guaifenesin (MUCINEX D) 60-600 MG per tablet; Take 1 tablet by mouth 2  (two) times daily as needed for congestion.   I have discontinued Ms. Chachere's ciprofloxacin and Pseudoephedrine-Guaifenesin. I am also having her start on amoxicillin-clavulanate and pseudoephedrine-guaifenesin. Additionally, I am having her maintain her ALIGN, BENEFIBER, docusate sodium, Psyllium, cetirizine, Vitamin D3, fluticasone, pantoprazole, losartan, phenazopyridine, and hydrochlorothiazide. We administered betamethasone acetate-betamethasone sodium phosphate.  Meds ordered this encounter  Medications  . DISCONTD: Pseudoephedrine-Guaifenesin [id number] MG TB12    Sig: Take by mouth.  Marland Kitchen amoxicillin-clavulanate (AUGMENTIN) 875-125 MG per tablet    Sig: Take 1 tablet by mouth 2 (two) times daily. Take all of this medication    Dispense:  20 tablet    Refill:  0  . pseudoephedrine-guaifenesin (MUCINEX D) 60-600 MG per tablet    Sig: Take 1 tablet by mouth 2 (two) times daily as needed for congestion.    Dispense:  20 tablet    Refill:  0  . betamethasone acetate-betamethasone sodium  phosphate (CELESTONE) injection 6 mg    Sig:      Follow-up: Return if symptoms worsen or fail to improve.  Claretta Fraise, M.D.

## 2014-10-31 ENCOUNTER — Other Ambulatory Visit: Payer: Self-pay | Admitting: Nurse Practitioner

## 2014-10-31 ENCOUNTER — Other Ambulatory Visit: Payer: Self-pay | Admitting: Family Medicine

## 2014-11-01 ENCOUNTER — Ambulatory Visit: Payer: Medicare Other | Admitting: Nurse Practitioner

## 2014-12-01 ENCOUNTER — Encounter: Payer: Self-pay | Admitting: Nurse Practitioner

## 2014-12-01 ENCOUNTER — Ambulatory Visit (INDEPENDENT_AMBULATORY_CARE_PROVIDER_SITE_OTHER): Payer: Medicare Other | Admitting: Nurse Practitioner

## 2014-12-01 VITALS — BP 132/76 | HR 86 | Temp 97.4°F | Ht 67.0 in | Wt 178.0 lb

## 2014-12-01 DIAGNOSIS — Z23 Encounter for immunization: Secondary | ICD-10-CM

## 2014-12-01 DIAGNOSIS — K5900 Constipation, unspecified: Secondary | ICD-10-CM | POA: Insufficient documentation

## 2014-12-01 DIAGNOSIS — M8580 Other specified disorders of bone density and structure, unspecified site: Secondary | ICD-10-CM

## 2014-12-01 DIAGNOSIS — I359 Nonrheumatic aortic valve disorder, unspecified: Secondary | ICD-10-CM | POA: Diagnosis not present

## 2014-12-01 DIAGNOSIS — E559 Vitamin D deficiency, unspecified: Secondary | ICD-10-CM | POA: Insufficient documentation

## 2014-12-01 DIAGNOSIS — N3090 Cystitis, unspecified without hematuria: Secondary | ICD-10-CM | POA: Diagnosis not present

## 2014-12-01 DIAGNOSIS — J302 Other seasonal allergic rhinitis: Secondary | ICD-10-CM

## 2014-12-01 DIAGNOSIS — Z6827 Body mass index (BMI) 27.0-27.9, adult: Secondary | ICD-10-CM | POA: Diagnosis not present

## 2014-12-01 DIAGNOSIS — I10 Essential (primary) hypertension: Secondary | ICD-10-CM

## 2014-12-01 MED ORDER — CETIRIZINE HCL 10 MG PO TABS
10.0000 mg | ORAL_TABLET | Freq: Every day | ORAL | Status: DC
Start: 2014-12-01 — End: 2015-06-26

## 2014-12-01 MED ORDER — FLUTICASONE PROPIONATE 50 MCG/ACT NA SUSP: NASAL | Status: DC

## 2014-12-01 NOTE — Progress Notes (Signed)
Subjective:    Patient ID: Michelle [name], female    DOB: Mar 18, 1934, 81 y.o.   MRN: [id number]   Patient here today for follow up of chronic medical problems.   Hypertension This is a chronic problem. The current episode started more than 1 year ago. The problem is controlled. Pertinent negatives include no chest pain or palpitations. Risk factors for coronary artery disease include family history, obesity, post-menopausal state and sedentary lifestyle. Past treatments include diuretics and angiotensin blockers. The current treatment provides moderate improvement. Compliance problems include diet and exercise.  There is no history of CAD/MI or CVA.  GERD protonix working well o keep symptoms under control Constipation Takes olace as needed- has BM 2-3x a week Vitamin d def Vitamin d daily- no side effects Allergic rhinitis flonase and zyrtec working well- still has some congestion but good for the most part Aortic valve disorder Sees cardiologist yearly- no c/o fatigue osteopenia No meds currently- no exercise- dexa due at end of year cystitis C/o urinary freq and urgency- sees urologist yearly  Review of Systems  Constitutional: Negative.   HENT: Negative.   Respiratory: Negative.   Cardiovascular: Negative for chest pain and palpitations.  Genitourinary: Negative.   Neurological: Negative.   Psychiatric/Behavioral: Negative.   All other systems reviewed and are negative.      Objective:   Physical Exam  Constitutional: She is oriented to person, place, and time. She appears well-developed and well-nourished.  HENT:  Nose: Nose normal.  Mouth/Throat: Oropharynx is clear and moist.  Eyes: EOM are normal.  Neck: Trachea normal, normal range of motion and full passive range of motion without pain. Neck supple. No JVD present. Carotid bruit is not present. No thyromegaly present.  Cardiovascular: Normal rate, regular rhythm and intact distal pulses.  Exam reveals no gallop  and no friction rub.   Murmur (3/6 systolic murmur) heard. Multiple lower ext varicosities  Pulmonary/Chest: Effort normal and breath sounds normal.  Abdominal: Soft. Bowel sounds are normal. She exhibits no distension and no mass. There is no tenderness.  Musculoskeletal: Normal range of motion.  Lymphadenopathy:    She has no cervical adenopathy.  Neurological: She is alert and oriented to person, place, and time. She has normal reflexes.  Skin: Skin is warm and dry.  Psychiatric: She has a normal mood and affect. Her behavior is normal. Judgment and thought content normal.    BP 132/76 mmHg  Pulse 86  Temp(Src) 97.4 F (36.3 C) (Oral)  Ht _0  (1.702 m)  Wt 178 lb (80.74 kg)  BMI 27.87 kg/m2       Assessment & Plan:  1. Essential hypertension Do not add salt to diet - CMP14+EGFR - NMR, lipoprofile  2. Aortic valve disorder *keep follow up appointment with Dr. Warren Lacy  3. Seasonal allergic rhinitis - cetirizine (ZYRTEC ALLERGY) 10 MG tablet; Take 1 tablet (10 mg total) by mouth daily. As needed  Dispense: 30 tablet; Refill: 5 - fluticasone (FLONASE) 50 MCG/ACT nasal spray; 2 SPRAYS NASALLY ONCE A DAY AS DIRECTED  Dispense: 16 g; Refill: 5  4. Osteopenia Weight bearing exercises encourgaed  5. Cystitis Force fluids  6. Constipation, unspecified constipation type Increase fiber in diet  7. Vitamin D deficiency - Vit D  25 hydroxy (rtn osteoporosis monitoring)  8. BMI 27.0-27.9,adult Discussed diet and exercise for person with BMI >25 Will recheck weight in 3-6 months     Labs pending Health maintenance reviewed Diet and exercise encouraged Continue all  meds Follow up  In 3 months   [place], FNP

## 2014-12-01 NOTE — Addendum Note (Signed)
Addended by: Rolena Infante on: 12/01/2014 04:37 PM   Modules accepted: Orders

## 2014-12-01 NOTE — Patient Instructions (Signed)

## 2014-12-02 LAB — CMP14+EGFR
ALBUMIN: 4.4 g/dL (ref 3.5–4.8)
ALT: 14 IU/L (ref 0–32)
AST: 17 IU/L (ref 0–40)
Albumin/Globulin Ratio: 2.1 (ref 1.1–2.5)
Alkaline Phosphatase: 67 IU/L (ref 39–117)
BUN / CREAT RATIO: 18 (ref 11–26)
BUN: 15 mg/dL (ref 8–27)
Bilirubin Total: 0.6 mg/dL (ref 0.0–1.2)
CO2: 28 mmol/L (ref 18–29)
CREATININE: 0.82 mg/dL (ref 0.57–1.00)
Calcium: 9.9 mg/dL (ref 8.7–10.3)
Chloride: 97 mmol/L (ref 97–108)
GFR, EST AFRICAN AMERICAN: 79 mL/min/{1.73_m2} (ref >59)
GFR, EST NON AFRICAN AMERICAN: 69 mL/min/{1.73_m2} (ref >59)
GLOBULIN, TOTAL: 2.1 g/dL (ref 1.5–4.5)
Glucose: 104 mg/dL — ABNORMAL HIGH (ref 65–99)
Potassium: 3.9 mmol/L (ref 3.5–5.2)
Sodium: 141 mmol/L (ref 134–144)
Total Protein: 6.5 g/dL (ref 6.0–8.5)

## 2014-12-02 LAB — NMR, LIPOPROFILE
Cholesterol: 277 mg/dL — ABNORMAL HIGH (ref 100–199)
HDL CHOLESTEROL BY NMR: 81 mg/dL (ref >39)
HDL Particle Number: 35.5 umol/L (ref >=30.5)
LDL PARTICLE NUMBER: 1805 nmol/L — AB (ref <1000)
LDL Size: 21.8 nm (ref >20.5)
LDL-C: 183 mg/dL — ABNORMAL HIGH (ref 0–99)
LP-IR Score: <25 (ref <=45)
Small LDL Particle Number: <90 nmol/L (ref <=527)
TRIGLYCERIDES BY NMR: 67 mg/dL (ref 0–149)

## 2014-12-02 LAB — VITAMIN D 25 HYDROXY (VIT D DEFICIENCY, FRACTURES): Vit D, 25-Hydroxy: 30.3 ng/mL (ref 30.0–100.0)

## 2014-12-29 ENCOUNTER — Other Ambulatory Visit: Payer: Self-pay | Admitting: Family Medicine

## 2015-01-13 ENCOUNTER — Ambulatory Visit (INDEPENDENT_AMBULATORY_CARE_PROVIDER_SITE_OTHER): Payer: Medicare Other | Admitting: Physician Assistant

## 2015-01-13 ENCOUNTER — Encounter: Payer: Self-pay | Admitting: Physician Assistant

## 2015-01-13 VITALS — BP 153/79 | HR 84 | Temp 97.7°F | Ht 67.0 in | Wt 183.0 lb

## 2015-01-13 DIAGNOSIS — R350 Frequency of micturition: Secondary | ICD-10-CM | POA: Diagnosis not present

## 2015-01-13 DIAGNOSIS — R300 Dysuria: Secondary | ICD-10-CM | POA: Diagnosis not present

## 2015-01-13 LAB — POCT UA - MICROSCOPIC ONLY
BACTERIA, U MICROSCOPIC: NEGATIVE
CRYSTALS, UR, HPF, POC: NEGATIVE
Casts, Ur, LPF, POC: NEGATIVE
Mucus, UA: NEGATIVE
RBC, urine, microscopic: NEGATIVE
Yeast, UA: NEGATIVE

## 2015-01-13 LAB — POCT URINALYSIS DIPSTICK
Bilirubin, UA: NEGATIVE
Blood, UA: NEGATIVE
GLUCOSE UA: NEGATIVE
Ketones, UA: NEGATIVE
NITRITE UA: NEGATIVE
PH UA: 7
Protein, UA: NEGATIVE
SPEC GRAV UA: 1.01
Urobilinogen, UA: NEGATIVE

## 2015-01-13 MED ORDER — CIPROFLOXACIN HCL 500 MG PO TABS: 500.0000 mg | ORAL_TABLET | Freq: Two times a day (BID) | ORAL | Status: DC

## 2015-01-13 NOTE — Progress Notes (Signed)
Subjective:     Patient ID: Michelle [name], female   DOB: 03-06-1934, 81 y.o.   MRN: [id number]  HPI Pt with polyuria and dysuria for 1-2 days Hx of freq UTI No fever/chills  Review of Systems  Constitutional: Negative.   Respiratory: Negative.   Cardiovascular: Negative.   Genitourinary: Positive for dysuria, urgency and frequency. Negative for hematuria.       Objective:   Physical Exam  Constitutional: She appears well-developed and well-nourished.  Cardiovascular: Normal rate, regular rhythm and normal heart sounds.   Pulmonary/Chest: Effort normal and breath sounds normal.  Abdominal: Soft. Bowel sounds are normal. She exhibits no distension and no mass. There is tenderness. There is no rebound and no guarding.  Suprapubic TTP  Nursing note and vitals reviewed.  Results for orders placed or performed in visit on 01/13/15  POCT urinalysis dipstick  Result Value Ref Range   Color, UA gold    Clarity, UA clear    Glucose, UA neg    Bilirubin, UA neg    Ketones, UA neg    Spec Grav, UA 1.010    Blood, UA neg    pH, UA 7.0    Protein, UA neg    Urobilinogen, UA negative    Nitrite, UA neg    Leukocytes, UA Trace (A) Negative  POCT UA - Microscopic Only  Result Value Ref Range   WBC, Ur, HPF, POC OCC    RBC, urine, microscopic NEG    Bacteria, U Microscopic NEG    Mucus, UA NEG    Epithelial cells, urine per micros OCC    Crystals, Ur, HPF, POC NEG    Casts, Ur, LPF, POC NEG    Yeast, UA NEG         Assessment:     1. Dysuria   2. Frequent urination        Plan:     Given hx Cipro 500mg  bid x 7 days Push fluids OTC meds for sx F/U prn

## 2015-01-13 NOTE — Patient Instructions (Signed)

## 2015-01-20 ENCOUNTER — Other Ambulatory Visit: Payer: Self-pay | Admitting: Physician Assistant

## 2015-01-20 DIAGNOSIS — J302 Other seasonal allergic rhinitis: Secondary | ICD-10-CM

## 2015-01-20 LAB — URINE CULTURE

## 2015-01-20 MED ORDER — DOXYCYCLINE HYCLATE 100 MG PO TABS: 100.0000 mg | ORAL_TABLET | Freq: Two times a day (BID) | ORAL | Status: DC

## 2015-01-23 ENCOUNTER — Telehealth: Payer: Self-pay | Admitting: *Deleted

## 2015-01-23 NOTE — Telephone Encounter (Signed)
-----   Message from Lodema Pilot, PA-C sent at 01/20/2015  4:21 PM EDT ----- Culture results show resistant to Cipro Due to allergies have to switch to Doxycycline

## 2015-01-23 NOTE — Telephone Encounter (Signed)
lmtcb

## 2015-01-24 ENCOUNTER — Telehealth: Payer: Self-pay | Admitting: Physician Assistant

## 2015-01-24 NOTE — Telephone Encounter (Signed)
Doxcycline is generic and UTI is resistant to other antibiotics other than bactrim and patient is alllergic to that

## 2015-01-24 NOTE — Telephone Encounter (Signed)
Patient aware and she will get the new antibiotic

## 2015-01-24 NOTE — Telephone Encounter (Signed)
Pt aware of urine results and will get new abx, she had already finished the other

## 2015-03-13 DIAGNOSIS — Z1231 Encounter for screening mammogram for malignant neoplasm of breast: Secondary | ICD-10-CM | POA: Diagnosis not present

## 2015-03-27 ENCOUNTER — Encounter: Payer: Self-pay | Admitting: Family Medicine

## 2015-04-21 ENCOUNTER — Ambulatory Visit (INDEPENDENT_AMBULATORY_CARE_PROVIDER_SITE_OTHER): Payer: Medicare Other

## 2015-04-21 DIAGNOSIS — Z23 Encounter for immunization: Secondary | ICD-10-CM | POA: Diagnosis not present

## 2015-05-25 DIAGNOSIS — Z85828 Personal history of other malignant neoplasm of skin: Secondary | ICD-10-CM | POA: Diagnosis not present

## 2015-05-25 DIAGNOSIS — B079 Viral wart, unspecified: Secondary | ICD-10-CM | POA: Diagnosis not present

## 2015-05-25 DIAGNOSIS — D485 Neoplasm of uncertain behavior of skin: Secondary | ICD-10-CM | POA: Diagnosis not present

## 2015-05-25 DIAGNOSIS — D225 Melanocytic nevi of trunk: Secondary | ICD-10-CM | POA: Diagnosis not present

## 2015-05-25 DIAGNOSIS — L570 Actinic keratosis: Secondary | ICD-10-CM | POA: Diagnosis not present

## 2015-05-25 DIAGNOSIS — Z08 Encounter for follow-up examination after completed treatment for malignant neoplasm: Secondary | ICD-10-CM | POA: Diagnosis not present

## 2015-05-25 DIAGNOSIS — L430 Hypertrophic lichen planus: Secondary | ICD-10-CM | POA: Diagnosis not present

## 2015-05-25 DIAGNOSIS — Z1283 Encounter for screening for malignant neoplasm of skin: Secondary | ICD-10-CM | POA: Diagnosis not present

## 2015-05-25 DIAGNOSIS — L438 Other lichen planus: Secondary | ICD-10-CM | POA: Diagnosis not present

## 2015-05-29 ENCOUNTER — Other Ambulatory Visit: Payer: Self-pay | Admitting: Cardiology

## 2015-06-15 DIAGNOSIS — L438 Other lichen planus: Secondary | ICD-10-CM | POA: Diagnosis not present

## 2015-06-26 ENCOUNTER — Ambulatory Visit (INDEPENDENT_AMBULATORY_CARE_PROVIDER_SITE_OTHER): Payer: Medicare Other | Admitting: Nurse Practitioner

## 2015-06-26 ENCOUNTER — Encounter: Payer: Self-pay | Admitting: Nurse Practitioner

## 2015-06-26 VITALS — BP 139/74 | HR 87 | Temp 97.4°F | Ht 67.0 in | Wt 176.0 lb

## 2015-06-26 DIAGNOSIS — Z1212 Encounter for screening for malignant neoplasm of rectum: Secondary | ICD-10-CM

## 2015-06-26 DIAGNOSIS — M8580 Other specified disorders of bone density and structure, unspecified site: Secondary | ICD-10-CM

## 2015-06-26 DIAGNOSIS — K219 Gastro-esophageal reflux disease without esophagitis: Secondary | ICD-10-CM | POA: Diagnosis not present

## 2015-06-26 DIAGNOSIS — I10 Essential (primary) hypertension: Secondary | ICD-10-CM | POA: Diagnosis not present

## 2015-06-26 DIAGNOSIS — R350 Frequency of micturition: Secondary | ICD-10-CM

## 2015-06-26 DIAGNOSIS — E559 Vitamin D deficiency, unspecified: Secondary | ICD-10-CM | POA: Diagnosis not present

## 2015-06-26 DIAGNOSIS — I359 Nonrheumatic aortic valve disorder, unspecified: Secondary | ICD-10-CM

## 2015-06-26 DIAGNOSIS — N3090 Cystitis, unspecified without hematuria: Secondary | ICD-10-CM | POA: Diagnosis not present

## 2015-06-26 DIAGNOSIS — J302 Other seasonal allergic rhinitis: Secondary | ICD-10-CM

## 2015-06-26 DIAGNOSIS — Z6827 Body mass index (BMI) 27.0-27.9, adult: Secondary | ICD-10-CM

## 2015-06-26 DIAGNOSIS — K5900 Constipation, unspecified: Secondary | ICD-10-CM

## 2015-06-26 LAB — POCT UA - MICROSCOPIC ONLY
Bacteria, U Microscopic: NEGATIVE
Casts, Ur, LPF, POC: NEGATIVE
Crystals, Ur, HPF, POC: NEGATIVE
Epithelial cells, urine per micros: NEGATIVE
RBC, URINE, MICROSCOPIC: NEGATIVE
Yeast, UA: NEGATIVE

## 2015-06-26 LAB — POCT URINALYSIS DIPSTICK
Blood, UA: NEGATIVE
Glucose, UA: NEGATIVE
Nitrite, UA: NEGATIVE
PH UA: 8
UROBILINOGEN UA: NEGATIVE

## 2015-06-26 MED ORDER — HYDROCHLOROTHIAZIDE 25 MG PO TABS: 25.0000 mg | ORAL_TABLET | Freq: Every day | ORAL | Status: DC

## 2015-06-26 MED ORDER — FLUTICASONE PROPIONATE 50 MCG/ACT NA SUSP: NASAL | Status: DC

## 2015-06-26 MED ORDER — LOSARTAN POTASSIUM 50 MG PO TABS: 50.0000 mg | ORAL_TABLET | Freq: Every day | ORAL | Status: DC

## 2015-06-26 MED ORDER — CETIRIZINE HCL 10 MG PO TABS: 10.0000 mg | ORAL_TABLET | Freq: Every day | ORAL | Status: DC

## 2015-06-26 MED ORDER — PANTOPRAZOLE SODIUM 40 MG PO TBEC: 40.0000 mg | DELAYED_RELEASE_TABLET | Freq: Every day | ORAL | Status: DC

## 2015-06-26 NOTE — Progress Notes (Signed)
Subjective:    Patient ID: Michelle [name], female    DOB: 10/23/1933, 81 y.o.   MRN: [id number]   Patient here today for follow up of chronic medical problems.   Hypertension This is a chronic problem. The current episode started more than 1 year ago. The problem is controlled. Pertinent negatives include no chest pain or palpitations. Risk factors for coronary artery disease include family history, obesity, post-menopausal state and sedentary lifestyle. Past treatments include diuretics and angiotensin blockers. The current treatment provides moderate improvement. Compliance problems include diet and exercise.  There is no history of CAD/MI or CVA.  GERD protonix working well o keep symptoms under control Constipation Takes olace as needed- has BM 2-3x a week Vitamin d def Vitamin d daily- no side effects Allergic rhinitis flonase and zyrtec working well- still has some congestion but good for the most part Aortic valve disorder Sees cardiologist yearly- no c/o fatigue osteopenia No meds currently- no exercise- dexa due at end of year cystitis C/o urinary freq and urgency and always wants her urine checked when she is here- sees urologist yearly  Review of Systems  Constitutional: Negative.   HENT: Negative.   Respiratory: Negative.   Cardiovascular: Negative for chest pain and palpitations.  Genitourinary: Negative.   Neurological: Negative.   Psychiatric/Behavioral: Negative.   All other systems reviewed and are negative.      Objective:   Physical Exam  Constitutional: She is oriented to person, place, and time. She appears well-developed and well-nourished.  HENT:  Nose: Nose normal.  Mouth/Throat: Oropharynx is clear and moist.  Eyes: EOM are normal.  Neck: Trachea normal, normal range of motion and full passive range of motion without pain. Neck supple. No JVD present. Carotid bruit is not present. No thyromegaly present.  Cardiovascular: Normal rate, regular  rhythm and intact distal pulses.  Exam reveals no gallop and no friction rub.   Murmur (3/6 systolic murmur) heard. Multiple lower ext varicosities  Pulmonary/Chest: Effort normal and breath sounds normal.  Abdominal: Soft. Bowel sounds are normal. She exhibits no distension and no mass. There is no tenderness.  Musculoskeletal: Normal range of motion.  Lymphadenopathy:    She has no cervical adenopathy.  Neurological: She is alert and oriented to person, place, and time. She has normal reflexes.  Skin: Skin is warm and dry.  Has healing lesion on left lower eg that she is seeing neurologist for.  Psychiatric: She has a normal mood and affect. Her behavior is normal. Judgment and thought content normal.    BP 139/74 mmHg  Pulse 87  Temp(Src) 97.4 F (36.3 C) (Oral)  Ht _0  (1.702 m)  Wt 176 lb (79.833 kg)  BMI 27.56 kg/m2        Assessment & Plan:  1. Urinary frequency Keep follow up with urology - POCT UA - Microscopic Only - POCT urinalysis dipstick  2. BMI 27.0-27.9,adult Discussed diet and exercise for person with BMI >25 Will recheck weight in 3-6 months   3. Vitamin D deficiency Continue vitamin D daily  4. Osteopenia Weight bearing exericses  5. Essential hypertension Do not add salt to diet - losartan (COZAAR) 50 MG tablet; Take 1 tablet (50 mg total) by mouth daily.  Dispense: 30 tablet; Refill: 5 - hydrochlorothiazide (HYDRODIURIL) 25 MG tablet; Take 1 tablet (25 mg total) by mouth daily.  Dispense: 30 tablet; Refill: 5 - CMP14+EGFR - Lipid panel  6. Aortic valve disorder kep follow up with cardilogy  7. Cystitis Force fluids Keep follow up with cardiology  8. Constipation, unspecified constipation type Continue colace  9. Seasonal allergic rhinitis - fluticasone (FLONASE) 50 MCG/ACT nasal spray; 2 SPRAYS NASALLY ONCE A DAY AS DIRECTED  Dispense: 16 g; Refill: 5 - cetirizine (ZYRTEC ALLERGY) 10 MG tablet; Take 1 tablet (10 mg total) by mouth  daily. As needed  Dispense: 30 tablet; Refill: 5  10. Gastroesophageal reflux disease without esophagitis Avoid spicy foods Do not eat 2 hours prior to bedtime  - pantoprazole (PROTONIX) 40 MG tablet; Take 1 tablet (40 mg total) by mouth daily.  Dispense: 30 tablet; Refill: 5  11. Screening for malignant neoplasm of the rectum - Fecal occult blood, imunochemical; Future    Labs pending Health maintenance reviewed Diet and exercise encouraged Continue all meds Follow up  In 3 month   Kane, FNP

## 2015-06-26 NOTE — Patient Instructions (Signed)
Bone Health Bones protect organs, store calcium, and anchor muscles. Good health habits, such as eating nutritious foods and exercising regularly, are important for maintaining healthy bones. They can also help to prevent a condition that causes bones to lose density and become weak and brittle (osteoporosis). WHY IS BONE MASS IMPORTANT? Bone mass refers to the amount of bone tissue that you have. The higher your bone mass, the stronger your bones. An important step toward having healthy bones throughout life is to have strong and dense bones during childhood. A young adult who has a high bone mass is more likely to have a high bone mass later in life. Bone mass at its greatest it is called peak bone mass. A large decline in bone mass occurs in older adults. In women, it occurs about the time of menopause. During this time, it is important to practice good health habits, because if more bone is lost than what is replaced, the bones will become less healthy and more likely to break (fracture). If you find that you have a low bone mass, you may be able to prevent osteoporosis or further bone loss by changing your diet and lifestyle. HOW CAN I FIND OUT IF MY BONE MASS IS LOW? Bone mass can be measured with an X-ray test that is called a bone mineral density (BMD) test. This test is recommended for all women who are age 65 or older. It may also be recommended for men who are age 70 or older, or for people who are more likely to develop osteoporosis due to:  Having bones that break easily.  Having a long-term disease that weakens bones, such as kidney disease or rheumatoid arthritis.  Having menopause earlier than normal.  Taking medicine that weakens bones, such as steroids, thyroid hormones, or hormone treatment for breast cancer or prostate cancer.  Smoking.  Drinking three or more alcoholic drinks each day. WHAT ARE THE NUTRITIONAL RECOMMENDATIONS FOR HEALTHY BONES? To have healthy bones, you need  to get enough of the right minerals and vitamins. Most nutrition experts recommend getting these nutrients from the foods that you eat. Nutritional recommendations vary from person to person. Ask your health care provider what is healthy for you. Here are some general guidelines. Calcium Recommendations Calcium is the most important (essential) mineral for bone health. Most people can get enough calcium from their diet, but supplements may be recommended for people who are at risk for osteoporosis. Good sources of calcium include:  Dairy products, such as low-fat or nonfat milk, cheese, and yogurt.  Dark green leafy vegetables, such as bok choy and broccoli.  Calcium-fortified foods, such as orange juice, cereal, bread, soy beverages, and tofu products.  Nuts, such as almonds. Follow these recommended amounts for daily calcium intake:  Children, age 1-3: 700 mg.  Children, age 4-8: 1,000 mg.  Children, age 9-13: 1,300 mg.  Teens, age 14-18: 1,300 mg.  Adults, age 19-50: 1,000 mg.  Adults, age 51-70:  Men: 1,000 mg.  Women: 1,200 mg.  Adults, age 71 or older: 1,200 mg.  Pregnant and breastfeeding females:  Teens: 1,300 mg.  Adults: 1,000 mg. Vitamin D Recommendations Vitamin D is the most essential vitamin for bone health. It helps the body to absorb calcium. Sunlight stimulates the skin to make vitamin D, so be sure to get enough sunlight. If you live in a cold climate or you do not get outside often, your health care provider may recommend that you take vitamin D supplements. Good   sources of vitamin D in your diet include:  Egg yolks.  Saltwater fish.  Milk and cereal fortified with vitamin D. Follow these recommended amounts for daily vitamin D intake:  Children and teens, age 1-18: 600 international units.  Adults, age 50 or younger: 400-800 international units.  Adults, age 51 or older: 800-1,000 international units. Other Nutrients Other nutrients for bone  health include:  Phosphorus. This mineral is found in meat, poultry, dairy foods, nuts, and legumes. The recommended daily intake for adult men and adult women is 700 mg.  Magnesium. This mineral is found in seeds, nuts, dark green vegetables, and legumes. The recommended daily intake for adult men is 400-420 mg. For adult women, it is 310-320 mg.  Vitamin K. This vitamin is found in green leafy vegetables. The recommended daily intake is 120 mg for adult men and 90 mg for adult women. WHAT TYPE OF PHYSICAL ACTIVITY IS BEST FOR BUILDING AND MAINTAINING HEALTHY BONES? Weight-bearing and strength-building activities are important for building and maintaining peak bone mass. Weight-bearing activities cause muscles and bones to work against gravity. Strength-building activities increases muscle strength that supports bones. Weight-bearing and muscle-building activities include:  Walking and hiking.  Jogging and running.  Dancing.  Gym exercises.  Lifting weights.  Tennis and racquetball.  Climbing stairs.  Aerobics. Adults should get at least 30 minutes of moderate physical activity on most days. Children should get at least 60 minutes of moderate physical activity on most days. Ask your health care provide what type of exercise is best for you. WHERE CAN I FIND MORE INFORMATION? For more information, check out the following websites:  National Osteoporosis Foundation: http://nof.org/learn/basics  National Institutes of Health: http://www.niams.nih.gov/Health_Info/Bone/Bone_Health/bone_health_for_life.asp   This information is not intended to replace advice given to you by your health care provider. Make sure you discuss any questions you have with your health care provider.   Document Released: 09/21/2003 Document Revised: 11/15/2014 Document Reviewed: 07/06/2014 Elsevier Interactive Patient Education 2016 Elsevier Inc.  

## 2015-06-27 LAB — CMP14+EGFR
ALBUMIN: 4.3 g/dL (ref 3.5–4.8)
ALT: 14 IU/L (ref 0–32)
AST: 21 IU/L (ref 0–40)
Albumin/Globulin Ratio: 2.2 (ref 1.1–2.5)
Alkaline Phosphatase: 65 IU/L (ref 39–117)
BILIRUBIN TOTAL: 0.5 mg/dL (ref 0.0–1.2)
BUN / CREAT RATIO: 14 (ref 11–26)
BUN: 11 mg/dL (ref 8–27)
CHLORIDE: 98 mmol/L (ref 96–106)
CO2: 26 mmol/L (ref 18–29)
CREATININE: 0.76 mg/dL (ref 0.57–1.00)
Calcium: 9.4 mg/dL (ref 8.7–10.3)
GFR calc non Af Amer: 75 mL/min/{1.73_m2} (ref >59)
GFR, EST AFRICAN AMERICAN: 86 mL/min/{1.73_m2} (ref >59)
GLUCOSE: 116 mg/dL — AB (ref 65–99)
Globulin, Total: 2 g/dL (ref 1.5–4.5)
Potassium: 3.7 mmol/L (ref 3.5–5.2)
Sodium: 141 mmol/L (ref 134–144)
TOTAL PROTEIN: 6.3 g/dL (ref 6.0–8.5)

## 2015-06-27 LAB — LIPID PANEL
Chol/HDL Ratio: 3.3 ratio units (ref 0.0–4.4)
Cholesterol, Total: 255 mg/dL — ABNORMAL HIGH (ref 100–199)
HDL: 77 mg/dL (ref >39)
LDL Calculated: 164 mg/dL — ABNORMAL HIGH (ref 0–99)
Triglycerides: 69 mg/dL (ref 0–149)
VLDL CHOLESTEROL CAL: 14 mg/dL (ref 5–40)

## 2015-07-03 ENCOUNTER — Other Ambulatory Visit: Payer: Medicare Other

## 2015-07-03 DIAGNOSIS — Z1212 Encounter for screening for malignant neoplasm of rectum: Secondary | ICD-10-CM | POA: Diagnosis not present

## 2015-07-04 ENCOUNTER — Telehealth: Payer: Self-pay | Admitting: Family Medicine

## 2015-07-04 LAB — FECAL OCCULT BLOOD, IMMUNOCHEMICAL: Fecal Occult Bld: NEGATIVE

## 2015-07-07 NOTE — Telephone Encounter (Signed)
Labs mailed

## 2015-08-24 DIAGNOSIS — M79676 Pain in unspecified toe(s): Secondary | ICD-10-CM | POA: Diagnosis not present

## 2015-08-24 DIAGNOSIS — B351 Tinea unguium: Secondary | ICD-10-CM | POA: Diagnosis not present

## 2015-12-25 ENCOUNTER — Other Ambulatory Visit: Payer: Self-pay | Admitting: Nurse Practitioner

## 2016-04-19 ENCOUNTER — Ambulatory Visit (INDEPENDENT_AMBULATORY_CARE_PROVIDER_SITE_OTHER): Payer: Medicare Other

## 2016-04-19 DIAGNOSIS — Z23 Encounter for immunization: Secondary | ICD-10-CM | POA: Diagnosis not present

## 2016-04-29 ENCOUNTER — Encounter: Payer: Self-pay | Admitting: Nurse Practitioner

## 2016-04-29 ENCOUNTER — Ambulatory Visit (INDEPENDENT_AMBULATORY_CARE_PROVIDER_SITE_OTHER): Payer: Medicare Other | Admitting: Nurse Practitioner

## 2016-04-29 VITALS — BP 148/84 | HR 76 | Temp 97.7°F | Ht 67.0 in | Wt 178.0 lb

## 2016-04-29 DIAGNOSIS — M8580 Other specified disorders of bone density and structure, unspecified site: Secondary | ICD-10-CM

## 2016-04-29 DIAGNOSIS — I359 Nonrheumatic aortic valve disorder, unspecified: Secondary | ICD-10-CM

## 2016-04-29 DIAGNOSIS — I10 Essential (primary) hypertension: Secondary | ICD-10-CM | POA: Diagnosis not present

## 2016-04-29 DIAGNOSIS — K219 Gastro-esophageal reflux disease without esophagitis: Secondary | ICD-10-CM

## 2016-04-29 DIAGNOSIS — K5900 Constipation, unspecified: Secondary | ICD-10-CM | POA: Diagnosis not present

## 2016-04-29 DIAGNOSIS — N3090 Cystitis, unspecified without hematuria: Secondary | ICD-10-CM

## 2016-04-29 DIAGNOSIS — Z23 Encounter for immunization: Secondary | ICD-10-CM

## 2016-04-29 DIAGNOSIS — E559 Vitamin D deficiency, unspecified: Secondary | ICD-10-CM | POA: Diagnosis not present

## 2016-04-29 DIAGNOSIS — J302 Other seasonal allergic rhinitis: Secondary | ICD-10-CM

## 2016-04-29 DIAGNOSIS — Z6827 Body mass index (BMI) 27.0-27.9, adult: Secondary | ICD-10-CM | POA: Diagnosis not present

## 2016-04-29 MED ORDER — LOSARTAN POTASSIUM 50 MG PO TABS: 50.0000 mg | ORAL_TABLET | Freq: Every day | ORAL | 5 refills | Status: DC

## 2016-04-29 MED ORDER — FLUTICASONE PROPIONATE 50 MCG/ACT NA SUSP: NASAL | 5 refills | Status: DC

## 2016-04-29 MED ORDER — CETIRIZINE HCL 10 MG PO TABS: 10.0000 mg | ORAL_TABLET | Freq: Every day | ORAL | 5 refills | Status: DC

## 2016-04-29 MED ORDER — HYDROCHLOROTHIAZIDE 25 MG PO TABS: 25.0000 mg | ORAL_TABLET | Freq: Every day | ORAL | 5 refills | Status: DC

## 2016-04-29 MED ORDER — PANTOPRAZOLE SODIUM 40 MG PO TBEC: 40.0000 mg | DELAYED_RELEASE_TABLET | Freq: Every day | ORAL | 5 refills | Status: DC

## 2016-04-29 NOTE — Patient Instructions (Signed)
Hemorrhoids Hemorrhoids are puffy (swollen) veins around the rectum or anus. Hemorrhoids can cause pain, itching, bleeding, or irritation. HOME CARE  Eat foods with fiber, such as whole grains, beans, nuts, fruits, and vegetables. Ask your doctor about taking products with added fiber in them (fibersupplements).  Drink enough fluid to keep your pee (urine) clear or pale yellow.  Exercise often.  Go to the bathroom when you have the urge to poop. Do not wait.  Avoid straining to poop (bowel movement).  Keep the butt area dry and clean. Use wet toilet paper or moist paper towels.  Medicated creams and medicine inserted into the anus (anal suppository) may be used or applied as told.  Only take medicine as told by your doctor.  Take a warm water bath (sitz bath) for 15-20 minutes to ease pain. Do this 3-4 times a day.  Place ice packs on the area if it is tender or puffy. Use the ice packs between the warm water baths.  Put ice in a plastic bag.  Place a towel between your skin and the bag.  Leave the ice on for 15-20 minutes, 03-04 times a day.  Do not use a donut-shaped pillow or sit on the toilet for a long time. GET HELP RIGHT AWAY IF:   You have more pain that is not controlled by treatment or medicine.  You have bleeding that will not stop.  You have trouble or are unable to poop (bowel movement).  You have pain or puffiness outside the area of the hemorrhoids. MAKE SURE YOU:   Understand these instructions.  Will watch your condition.  Will get help right away if you are not doing well or get worse.   This information is not intended to replace advice given to you by your health care provider. Make sure you discuss any questions you have with your health care provider.   Document Released: 04/09/2008 Document Revised: 06/17/2012 Document Reviewed: 05/12/2012 Elsevier Interactive Patient Education [employer].

## 2016-04-29 NOTE — Progress Notes (Signed)
Subjective:    Patient ID: Michelle [name], female    DOB: Jul 24, 1933, 82 y.o.   MRN: [id number]   Patient here today for follow up of chronic medical problems. Denies any changes since last visit. The patient does have complaints today about her hemorrhoids stating that they have been bothering her lately.  Hypertension  This is a chronic problem. The current episode started more than 1 year ago. The problem is controlled. Pertinent negatives include no chest pain or palpitations. Risk factors for coronary artery disease include family history, obesity, post-menopausal state and sedentary lifestyle. Past treatments include diuretics and angiotensin blockers. The current treatment provides moderate improvement. Compliance problems include diet and exercise.  There is no history of CAD/MI or CVA.  GERD Controlled on Protonix Constipation Has BM typically every day. She does feel like she has to strain, but says her stools are not hard. Take Benefiber every morning, Metamucil every night and Colace as needed. Vitamin d def Vitamin d daily- no side effects  Allergic rhinitis Controlled with Zyrtec and Flonase Aortic valve disorder Sees cardiologist yearly- no c/o fatigue. Appointment with cardiologist in December.  osteopenia Some exercise at home. Dexa scan 2014. cystitis Has not seen urologist recently and has only had one UTI this year.     Medication List       Accurate as of 04/29/16  9:04 AM. Always use your most recent med list.          BENEFIBER Powd Take by mouth. Takes in the morning   cetirizine 10 MG tablet Commonly known as:  ZYRTEC ALLERGY Take 1 tablet (10 mg total) by mouth daily. As needed   COLACE 100 MG capsule Generic drug:  docusate sodium Take 100 mg by mouth. As needed   fluticasone 50 MCG/ACT nasal spray Commonly known as:  FLONASE 2 SPRAYS NASALLY ONCE A DAY AS DIRECTED   hydrochlorothiazide 25 MG tablet Commonly known as:  HYDRODIURIL Take 1  tablet (25 mg total) by mouth daily.   losartan 50 MG tablet Commonly known as:  COZAAR Take 1 tablet (50 mg total) by mouth daily.   METAMUCIL 30.9 % Powd Generic drug:  Psyllium Take by mouth. At night   pantoprazole 40 MG tablet Commonly known as:  PROTONIX Take 1 tablet (40 mg total) by mouth daily.   Vitamin D3 2000 units Tabs Take by mouth daily.        Review of Systems  Constitutional: Negative.   HENT: Negative.   Eyes: Negative.   Respiratory: Negative.   Cardiovascular: Negative for chest pain and palpitations.  Gastrointestinal: Positive for constipation.  Endocrine: Negative.   Genitourinary: Negative.   Musculoskeletal: Negative.   Skin: Negative.   Neurological: Negative.   Hematological: Negative.   Psychiatric/Behavioral: Negative.        Objective:   Physical Exam  Constitutional: She is oriented to person, place, and time. She appears well-developed and well-nourished.  HENT:  Head: Normocephalic.  Right Ear: External ear normal.  Left Ear: External ear normal.  Nose: Nose normal.  Mouth/Throat: Oropharynx is clear and moist.  Eyes: Conjunctivae and EOM are normal. Pupils are equal, round, and reactive to light.  Neck: Trachea normal, normal range of motion and full passive range of motion without pain. Neck supple. No JVD present. Carotid bruit is not present. No thyromegaly present.  Cardiovascular: Normal rate and intact distal pulses.  Exam reveals no gallop and no friction rub.   Murmur (3/6 systolic murmur)  heard. Multiple lower ext varicosities  Pulmonary/Chest: Effort normal and breath sounds normal.  Abdominal: Soft. Bowel sounds are normal. She exhibits no distension and no mass. There is no tenderness.  Musculoskeletal: Normal range of motion.  Lymphadenopathy:    She has no cervical adenopathy.  Neurological: She is alert and oriented to person, place, and time. She has normal reflexes.  Skin: Skin is warm and dry.    Psychiatric: She has a normal mood and affect. Her behavior is normal. Judgment and thought content normal.    BP (!) 148/84   Pulse 76   Temp 97.7 F (36.5 C) (Oral)   Ht '5\' 7"'$  (1.702 m)   Wt 178 lb (80.7 kg)   BMI 27.88 kg/m       Assessment & Plan:  1. Aortic valve disorder Keep follow-up with your cardiologist  2. Essential hypertension Do not add salt to foods - CMP14+EGFR - Lipid panel - hydrochlorothiazide (HYDRODIURIL) 25 MG tablet; Take 1 tablet (25 mg total) by mouth daily.  Dispense: 30 tablet; Refill: 5 - losartan (COZAAR) 50 MG tablet; Take 1 tablet (50 mg total) by mouth daily.  Dispense: 30 tablet; Refill: 5  3. Chronic seasonal allergic rhinitis, unspecified trigger - cetirizine (ZYRTEC ALLERGY) 10 MG tablet; Take 1 tablet (10 mg total) by mouth daily. As needed  Dispense: 30 tablet; Refill: 5 - fluticasone (FLONASE) 50 MCG/ACT nasal spray; 2 SPRAYS NASALLY ONCE A DAY AS DIRECTED  Dispense: 16 g; Refill: 5  4. Constipation, unspecified constipation type Continue current medications. Increase fiber in diet. Drink plenty of fluids. Consider following up with gastroenterologist. Continue exercising as tolerated.  5. Osteopenia, unspecified location Weight bearing exercises as tolerated.  6. Cystitis Controlled  7. Vitamin D deficiency Continue current medications. - VITAMIN D 25 Hydroxy (Vit-D Deficiency, Fractures)  8. BMI 27.0-27.9,adult Discussed diet and exercise for person with BMI >25 Will recheck weight in 3-6 months  9. Gastroesophageal reflux disease without esophagitis Avoid spicy foods Do not eat 2 hours prior to bedtime - pantoprazole (PROTONIX) 40 MG tablet; Take 1 tablet (40 mg total) by mouth daily.  Dispense: 30 tablet; Refill: 5    Labs pending Health maintenance reviewed Diet and exercise encouraged Continue all meds Follow up  In 6 months   Hendricks Limes, BSN-RN/FNP student  [place], [place]

## 2016-04-30 LAB — CMP14+EGFR
ALBUMIN: 4.2 g/dL (ref 3.5–4.7)
ALK PHOS: 61 IU/L (ref 39–117)
ALT: 13 IU/L (ref 0–32)
AST: 22 IU/L (ref 0–40)
Albumin/Globulin Ratio: 1.8 (ref 1.2–2.2)
BILIRUBIN TOTAL: 0.5 mg/dL (ref 0.0–1.2)
BUN / CREAT RATIO: 19 (ref 12–28)
BUN: 14 mg/dL (ref 8–27)
CHLORIDE: 98 mmol/L (ref 96–106)
CO2: 28 mmol/L (ref 18–29)
Calcium: 9.4 mg/dL (ref 8.7–10.3)
Creatinine, Ser: 0.74 mg/dL (ref 0.57–1.00)
GFR calc Af Amer: 88 mL/min/{1.73_m2} (ref >59)
GFR calc non Af Amer: 77 mL/min/{1.73_m2} (ref >59)
GLUCOSE: 91 mg/dL (ref 65–99)
Globulin, Total: 2.3 g/dL (ref 1.5–4.5)
Potassium: 3.5 mmol/L (ref 3.5–5.2)
SODIUM: 140 mmol/L (ref 134–144)
Total Protein: 6.5 g/dL (ref 6.0–8.5)

## 2016-04-30 LAB — VITAMIN D 25 HYDROXY (VIT D DEFICIENCY, FRACTURES): VIT D 25 HYDROXY: 34.1 ng/mL (ref 30.0–100.0)

## 2016-04-30 LAB — LIPID PANEL
CHOLESTEROL TOTAL: 249 mg/dL — AB (ref 100–199)
Chol/HDL Ratio: 3.5 ratio units (ref 0.0–4.4)
HDL: 72 mg/dL (ref >39)
LDL Calculated: 161 mg/dL — ABNORMAL HIGH (ref 0–99)
TRIGLYCERIDES: 80 mg/dL (ref 0–149)
VLDL CHOLESTEROL CAL: 16 mg/dL (ref 5–40)

## 2016-05-06 DIAGNOSIS — L820 Inflamed seborrheic keratosis: Secondary | ICD-10-CM | POA: Diagnosis not present

## 2016-05-06 DIAGNOSIS — C4441 Basal cell carcinoma of skin of scalp and neck: Secondary | ICD-10-CM | POA: Diagnosis not present

## 2016-05-06 DIAGNOSIS — D225 Melanocytic nevi of trunk: Secondary | ICD-10-CM | POA: Diagnosis not present

## 2016-05-06 DIAGNOSIS — Z1283 Encounter for screening for malignant neoplasm of skin: Secondary | ICD-10-CM | POA: Diagnosis not present

## 2016-06-11 NOTE — Progress Notes (Signed)
HPI The patient presents for evaluation. She has some mild aortic stenosis.  I last saw her in 2015. Her last echo was in 2014.  At the last visit she did have some cough and I took her off her ACE inhibitor. She thinks that maybe better but she doesn't pay much attention to it. She's been tired because she's been very active with a lot of social engagements this year. The patient denies any new symptoms such as chest discomfort, neck or arm discomfort. There has been no new shortness of breath, PND or orthopnea. There have been no reported palpitations, presyncope or syncope.   Allergies  Allergen Reactions  . Nitrofurantoin   . Sulfonamide Derivatives   . Lisinopril Cough    Current Outpatient Prescriptions  Medication Sig Dispense Refill  . cetirizine (ZYRTEC ALLERGY) 10 MG tablet Take 1 tablet (10 mg total) by mouth daily. As needed 30 tablet 5  . Cholecalciferol (VITAMIN D3) 2000 UNITS TABS Take by mouth daily.      Marland Kitchen docusate sodium (COLACE) 100 MG capsule Take 100 mg by mouth. As needed    . fluticasone (FLONASE) 50 MCG/ACT nasal spray 2 SPRAYS NASALLY ONCE A DAY AS DIRECTED 16 g 5  . hydrochlorothiazide (HYDRODIURIL) 25 MG tablet Take 1 tablet (25 mg total) by mouth daily. 30 tablet 5  . losartan (COZAAR) 50 MG tablet Take 1 tablet (50 mg total) by mouth daily. 30 tablet 5  . pantoprazole (PROTONIX) 40 MG tablet Take 1 tablet (40 mg total) by mouth daily. 30 tablet 5  . Psyllium (METAMUCIL) 30.9 % POWD Take by mouth. At night    . Wheat Dextrin (BENEFIBER) POWD Take by mouth. Takes in the morning     No current facility-administered medications for this visit.     Past Medical History:  Diagnosis Date  . Aortic stenosis    Mild echo 2014  . Diverticulosis   . Duodenitis without mention of hemorrhage   . Esophagitis, unspecified   . IBS (irritable bowel syndrome)   . Uterine cancer [facility])     Past Surgical History:  Procedure Laterality Date  . FOOT SURGERY     bilateral   . VAGINAL HYSTERECTOMY      ROS:  Positive for hemorrhoids, stomach problems. Otherwise as stated in the HPI and negative for all other systems.  PHYSICAL EXAM BP 138/72   Pulse 75   Ht 5\' 7"  (1.702 m)   Wt 176 lb (79.8 kg)   SpO2 98%   BMI 27.57 kg/m  GENERAL:  Well appearing NECK:  No jugular venous distention, waveform within normal limits, carotid upstroke brisk and symmetric, no bruits, transmitted systolic murmur no thyromegaly LUNGS:  Clear to auscultation bilaterally BACK:  No CVA tenderness CHEST:  Unremarkable HEART:  PMI not displaced or sustained,S1 and S2 within normal limits, no S3, no S4, no clicks, no rubs, 3/6 apical early mid peaking systolic murmur radiating out the aortic outflow tract, no diastolicmurmurs ABD:  Flat, positive bowel sounds normal in frequency in pitch, no bruits, no rebound, no guarding, no midline pulsatile mass, no hepatomegaly, no splenomegaly EXT:  2 plus pulses throughout, no edema, no cyanosis no clubbing   EKG: sinus rhythm, rate 73, left axis deviation, poor anterior R wave progression, no acute ST-T wave changes. 06/12/2016   ASSESSMENT AND PLAN  AORTIC STENOSIS:  Her aortic stenosis was mild to moderate on echo in 2014.  I would not suspect that this was worse.  No further imaging is indicated.  I will follow this clinically.   COUGH:  This is improved.  She will continue on Cozaar.   HTN:  The blood pressure is at target. No change in medications is indicated. We will continue with therapeutic lifestyle changes (TLC).

## 2016-06-12 ENCOUNTER — Ambulatory Visit (INDEPENDENT_AMBULATORY_CARE_PROVIDER_SITE_OTHER): Payer: Medicare Other | Admitting: Cardiology

## 2016-06-12 ENCOUNTER — Encounter: Payer: Self-pay | Admitting: Cardiology

## 2016-06-12 VITALS — BP 138/72 | HR 75 | Ht 67.0 in | Wt 176.0 lb

## 2016-06-12 DIAGNOSIS — I10 Essential (primary) hypertension: Secondary | ICD-10-CM | POA: Diagnosis not present

## 2016-06-12 DIAGNOSIS — I350 Nonrheumatic aortic (valve) stenosis: Secondary | ICD-10-CM | POA: Diagnosis not present

## 2016-06-12 NOTE — Patient Instructions (Signed)

## 2016-06-19 ENCOUNTER — Ambulatory Visit: Payer: Medicare Other | Admitting: Cardiology

## 2016-11-11 ENCOUNTER — Other Ambulatory Visit: Payer: Self-pay | Admitting: Nurse Practitioner

## 2016-11-11 DIAGNOSIS — I10 Essential (primary) hypertension: Secondary | ICD-10-CM

## 2016-11-12 NOTE — Telephone Encounter (Signed)
Last refill without being seen 

## 2016-11-13 NOTE — Telephone Encounter (Signed)
Pt aware NTBS before next RF

## 2016-11-27 ENCOUNTER — Encounter: Payer: Self-pay | Admitting: Nurse Practitioner

## 2016-11-27 ENCOUNTER — Ambulatory Visit (INDEPENDENT_AMBULATORY_CARE_PROVIDER_SITE_OTHER): Payer: Medicare Other | Admitting: Nurse Practitioner

## 2016-11-27 VITALS — BP 153/76 | HR 77 | Temp 97.5°F | Ht 67.0 in | Wt 178.0 lb

## 2016-11-27 DIAGNOSIS — N3090 Cystitis, unspecified without hematuria: Secondary | ICD-10-CM

## 2016-11-27 DIAGNOSIS — I359 Nonrheumatic aortic valve disorder, unspecified: Secondary | ICD-10-CM

## 2016-11-27 DIAGNOSIS — I10 Essential (primary) hypertension: Secondary | ICD-10-CM | POA: Diagnosis not present

## 2016-11-27 DIAGNOSIS — J302 Other seasonal allergic rhinitis: Secondary | ICD-10-CM

## 2016-11-27 DIAGNOSIS — Z6827 Body mass index (BMI) 27.0-27.9, adult: Secondary | ICD-10-CM | POA: Diagnosis not present

## 2016-11-27 DIAGNOSIS — R0989 Other specified symptoms and signs involving the circulatory and respiratory systems: Secondary | ICD-10-CM

## 2016-11-27 DIAGNOSIS — K219 Gastro-esophageal reflux disease without esophagitis: Secondary | ICD-10-CM

## 2016-11-27 DIAGNOSIS — K5900 Constipation, unspecified: Secondary | ICD-10-CM | POA: Diagnosis not present

## 2016-11-27 DIAGNOSIS — E559 Vitamin D deficiency, unspecified: Secondary | ICD-10-CM

## 2016-11-27 DIAGNOSIS — R350 Frequency of micturition: Secondary | ICD-10-CM | POA: Diagnosis not present

## 2016-11-27 DIAGNOSIS — M8580 Other specified disorders of bone density and structure, unspecified site: Secondary | ICD-10-CM

## 2016-11-27 LAB — CMP14+EGFR
ALT: 17 IU/L (ref 0–32)
AST: 26 IU/L (ref 0–40)
Albumin/Globulin Ratio: 2.1 (ref 1.2–2.2)
Albumin: 4.2 g/dL (ref 3.5–4.7)
Alkaline Phosphatase: 63 IU/L (ref 39–117)
BUN/Creatinine Ratio: 17 (ref 12–28)
BUN: 14 mg/dL (ref 8–27)
Bilirubin Total: 0.6 mg/dL (ref 0.0–1.2)
CALCIUM: 9.3 mg/dL (ref 8.7–10.3)
CO2: 27 mmol/L (ref 18–29)
CREATININE: 0.83 mg/dL (ref 0.57–1.00)
Chloride: 102 mmol/L (ref 96–106)
GFR calc Af Amer: 77 mL/min/{1.73_m2} (ref >59)
GFR, EST NON AFRICAN AMERICAN: 67 mL/min/{1.73_m2} (ref >59)
GLUCOSE: 100 mg/dL — AB (ref 65–99)
Globulin, Total: 2 g/dL (ref 1.5–4.5)
POTASSIUM: 3.7 mmol/L (ref 3.5–5.2)
Sodium: 141 mmol/L (ref 134–144)
Total Protein: 6.2 g/dL (ref 6.0–8.5)

## 2016-11-27 LAB — LIPID PANEL
CHOL/HDL RATIO: 3.5 ratio (ref 0.0–4.4)
Cholesterol, Total: 234 mg/dL — ABNORMAL HIGH (ref 100–199)
HDL: 67 mg/dL (ref >39)
LDL Calculated: 153 mg/dL — ABNORMAL HIGH (ref 0–99)
Triglycerides: 69 mg/dL (ref 0–149)
VLDL CHOLESTEROL CAL: 14 mg/dL (ref 5–40)

## 2016-11-27 LAB — URINALYSIS, COMPLETE
BILIRUBIN UA: NEGATIVE
Glucose, UA: NEGATIVE
Nitrite, UA: NEGATIVE
PH UA: 7 (ref 5.0–7.5)
Protein, UA: NEGATIVE
RBC UA: NEGATIVE
Specific Gravity, UA: 1.02 (ref 1.005–1.030)
Urobilinogen, Ur: 0.2 mg/dL (ref 0.2–1.0)

## 2016-11-27 LAB — MICROSCOPIC EXAMINATION
BACTERIA UA: NONE SEEN
RBC MICROSCOPIC, UA: NONE SEEN /HPF (ref 0–?)
Renal Epithel, UA: NONE SEEN /hpf

## 2016-11-27 MED ORDER — LOSARTAN POTASSIUM 50 MG PO TABS: 50.0000 mg | ORAL_TABLET | Freq: Every day | ORAL | 0 refills | Status: DC

## 2016-11-27 MED ORDER — FLUTICASONE PROPIONATE 50 MCG/ACT NA SUSP: NASAL | 5 refills | Status: DC

## 2016-11-27 MED ORDER — HYDROCHLOROTHIAZIDE 25 MG PO TABS: 25.0000 mg | ORAL_TABLET | Freq: Every day | ORAL | 5 refills | Status: DC

## 2016-11-27 MED ORDER — PANTOPRAZOLE SODIUM 40 MG PO TBEC: 40.0000 mg | DELAYED_RELEASE_TABLET | Freq: Every day | ORAL | 5 refills | Status: DC

## 2016-11-27 MED ORDER — CETIRIZINE HCL 10 MG PO TABS
10.0000 mg | ORAL_TABLET | Freq: Every day | ORAL | 5 refills | Status: AC
Start: 2016-11-27 — End: ?

## 2016-11-27 NOTE — Addendum Note (Signed)
Addended by: Rolena Infante on: 11/27/2016 08:59 AM   Modules accepted: Orders

## 2016-11-27 NOTE — Progress Notes (Signed)
Subjective:    Patient ID: Michelle [name], female    DOB: 07-05-1934, 83 y.o.   MRN: [id number]  HPI Michelle [name] is here today for follow up of chronic medical problem.  Outpatient Encounter Prescriptions as of 11/27/2016  Medication Sig  . cetirizine (ZYRTEC ALLERGY) 10 MG tablet Take 1 tablet (10 mg total) by mouth daily. As needed  . Cholecalciferol (VITAMIN D3) 2000 UNITS TABS Take by mouth daily.    Marland Kitchen docusate sodium (COLACE) 100 MG capsule Take 100 mg by mouth. As needed  . fluticasone (FLONASE) 50 MCG/ACT nasal spray 2 SPRAYS NASALLY ONCE A DAY AS DIRECTED  . hydrochlorothiazide (HYDRODIURIL) 25 MG tablet Take 1 tablet (25 mg total) by mouth daily.  Marland Kitchen losartan (COZAAR) 50 MG tablet Take 1 tablet (50 mg total) by mouth daily.  . pantoprazole (PROTONIX) 40 MG tablet Take 1 tablet (40 mg total) by mouth daily.  . Psyllium (METAMUCIL) 30.9 % POWD Take by mouth. At night  . Wheat Dextrin (BENEFIBER) POWD Take by mouth. Takes in the morning   No facility-administered encounter medications on file as of 11/27/2016.     1. Frequent urination  Patient is always c/o frequency- has dx of cystitis which is a complication of that- wants urine checked today  2. Aortic valve disorder  Sees cardiologist every 6 months  3. Essential hypertension  No c/o chest pain,SOB or HA- does not check blood pressure at home  4. Seasonal allergic rhinitis, unspecified trigger  Currently using flonase OTC which works well for her  5. Constipation, unspecified constipation type  Has a long history of constipation- uses miralax daily which helps  6. Osteopenia, unspecified location  does very little dedicated weight bearing exercises  7. Cystitis  Sees urologist  8. BMI 27.0-27.9,adult  No recent weight gain or weight loss  9. Vitamin D deficiency  Takes vitamin d 2000iu OTC daily    New complaints: None today other then frequent urination mentioned above.    Review of Systems    Constitutional: Negative for diaphoresis.  Eyes: Negative for pain.  Respiratory: Negative for shortness of breath.   Cardiovascular: Negative for chest pain, palpitations and leg swelling.  Gastrointestinal: Negative for abdominal pain.  Endocrine: Negative for polydipsia.  Genitourinary: Positive for frequency and urgency.  Skin: Negative for rash.  Neurological: Negative for dizziness, weakness and headaches.  Hematological: Does not bruise/bleed easily.       Objective:   Physical Exam  Constitutional: She is oriented to person, place, and time. She appears well-developed and well-nourished.  HENT:  Nose: Nose normal.  Mouth/Throat: Oropharynx is clear and moist.  Eyes: EOM are normal.  Neck: Trachea normal, normal range of motion and full passive range of motion without pain. Neck supple. No JVD present. Carotid bruit is not present. No thyromegaly present.  Cardiovascular: Normal rate, regular rhythm and intact distal pulses.  Exam reveals no gallop and no friction rub.   No murmur heard. bil carotid bruits  Pulmonary/Chest: Effort normal and breath sounds normal.  Abdominal: Soft. Bowel sounds are normal. She exhibits no distension and no mass. There is no tenderness.  Musculoskeletal: Normal range of motion.  Lymphadenopathy:    She has no cervical adenopathy.  Neurological: She is alert and oriented to person, place, and time. She has normal reflexes.  Skin: Skin is warm and dry.  Psychiatric: She has a normal mood and affect. Her behavior is normal. Judgment and thought content normal.  BP (!) 153/76   Pulse 77   Temp 97.5 F (36.4 C) (Oral)   Ht 5\' 7"  (1.702 m)   Wt 178 lb (80.7 kg)   BMI 27.88 kg/m   Urine clear      Assessment & Plan:  1. Frequent urination Avoid driking liquids 2 hours prior to bedtime - Urinalysis, Complete  2. Aortic valve disorder Keep follow up appointments with Dr. Warren Lacy  3. Essential hypertension Low sodium diet -  hydrochlorothiazide (HYDRODIURIL) 25 MG tablet; Take 1 tablet (25 mg total) by mouth daily.  Dispense: 30 tablet; Refill: 5 - losartan (COZAAR) 50 MG tablet; Take 1 tablet (50 mg total) by mouth daily.  Dispense: 30 tablet; Refill: 0  4. Seasonal allergic rhinitis, unspecified trigger - cetirizine (ZYRTEC ALLERGY) 10 MG tablet; Take 1 tablet (10 mg total) by mouth daily. As needed  Dispense: 30 tablet; Refill: 5 - fluticasone (FLONASE) 50 MCG/ACT nasal spray; 2 SPRAYS NASALLY ONCE A DAY AS DIRECTED  Dispense: 16 g; Refill: 5  5. Constipation, unspecified constipation type Continue stool sotners  6. Osteopenia, unspecified location Weight bearing exercises encouraged  7. Cystitis Keep follow up with urologist  8. BMI 27.0-27.9,adult Discussed diet and exercise for person with BMI >25 Will recheck weight in 3-6 months  9. Vitamin D deficiency  10. Gastroesophageal reflux disease without esophagitis Avoid spicy foods Do not eat 2 hours prior to bedtime - pantoprazole (PROTONIX) 40 MG tablet; Take 1 tablet (40 mg total) by mouth daily.  Dispense: 30 tablet; Refill: 5  11. Bilateral carotid bruits Will wait on U/s results - US Carotid Duplex Bilateral; Future    Labs pending Health maintenance reviewed Diet and exercise encouraged Continue all meds Follow up  In 3 months   [place], FNP

## 2016-11-27 NOTE — Patient Instructions (Signed)
Interstitial Cystitis Interstitial cystitis is a condition that causes inflammation of the bladder. The bladder is a hollow organ in the lower part of your abdomen. It stores urine after the urine is made by your kidneys. With interstitial cystitis, you may have pain in the bladder area. You may also have a frequent and urgent need to urinate. The severity of interstitial cystitis can vary from person to person. You may have flare-ups of the condition, and then it may go away for a while. For many people who have this condition, it becomes a long-term problem. What are the causes? The cause of this condition is not known. What increases the risk? This condition is more likely to develop in women. What are the signs or symptoms? Symptoms of interstitial cystitis vary, and they can change over time. Symptoms may include:  Discomfort or pain in the bladder area. This can range from mild to severe. The pain may change in intensity as the bladder fills with urine or as it empties.  Pelvic pain.  An urgent need to urinate.  Frequent urination.  Pain during sexual intercourse.  Pinpoint bleeding on the bladder wall. For women, the symptoms often get worse during menstruation. How is this diagnosed? This condition is diagnosed by evaluating your symptoms and ruling out other causes. A physical exam will be done. Various tests may be done to rule out other conditions. Common tests include:  Urine tests.  Cystoscopy. In this test, a tool that is like a very thin telescope is used to look into your bladder.  Biopsy. This involves taking a sample of tissue from the bladder wall to be examined under a microscope. How is this treated? There is no cure for interstitial cystitis, but treatment methods are available to control your symptoms. Work closely with your health care provider to find the treatments that will be most effective for you. Treatment options may include:  Medicines to relieve pain  and to help reduce the number of times that you feel the need to urinate.  Bladder training. This involves learning ways to control when you urinate, such as:  Urinating at scheduled times.  Training yourself to delay urination.  Doing exercises (Kegel exercises) to strengthen the muscles that control urine flow.  Lifestyle changes, such as changing your diet or taking steps to control stress.  Use of a device that provides electrical stimulation in order to reduce pain.  A procedure that stretches your bladder by filling it with air or fluid.  Surgery. This is rare. It is only done for extreme cases if other treatments do not help. Follow these instructions at home:  Take medicines only as directed by your health care provider.  Use bladder training techniques as directed.  Keep a bladder diary to find out which foods, liquids, or activities make your symptoms worse.  Use your bladder diary to schedule bathroom trips. If you are away from home, plan to be near a bathroom at each of your scheduled times.  Make sure you urinate just before you leave the house and just before you go to bed.  Do Kegel exercises as directed by your health care provider.  Do not drink alcohol.  Do not use any tobacco products, including cigarettes, chewing tobacco, or electronic cigarettes. If you need help quitting, ask your health care provider.  Make dietary changes as directed by your health care provider. You may need to avoid spicy foods and foods that contain a high amount of potassium.  Limit your drinking of beverages that stimulate urination. These include soda, coffee, and tea.  Keep all follow-up visits as directed by your health care provider. This is important. Contact a health care provider if:  Your symptoms do not get better after treatment.  Your pain and discomfort are getting worse.  You have more frequent urges to urinate.  You have a fever. Get help right away  if:  You are not able to control your bladder at all. This information is not intended to replace advice given to you by your health care provider. Make sure you discuss any questions you have with your health care provider. Document Released: 03/01/2004 Document Revised: 12/07/2015 Document Reviewed: 03/08/2014 Elsevier Interactive Patient Education  2017 [employer].

## 2016-11-29 ENCOUNTER — Encounter: Payer: Self-pay | Admitting: *Deleted

## 2016-12-04 ENCOUNTER — Ambulatory Visit (HOSPITAL_COMMUNITY): Payer: Medicare Other

## 2016-12-25 ENCOUNTER — Ambulatory Visit (HOSPITAL_COMMUNITY)
Admission: RE | Admit: 2016-12-25 | Discharge: 2016-12-25 | Disposition: A | Discharge status: Home / Self Care | Payer: Medicare Other | Source: Ambulatory Visit | Attending: Nurse Practitioner | Admitting: Nurse Practitioner

## 2016-12-25 DIAGNOSIS — R0989 Other specified symptoms and signs involving the circulatory and respiratory systems: Secondary | ICD-10-CM

## 2016-12-25 DIAGNOSIS — I6523 Occlusion and stenosis of bilateral carotid arteries: Secondary | ICD-10-CM | POA: Diagnosis not present

## 2016-12-27 ENCOUNTER — Telehealth: Payer: Self-pay | Admitting: *Deleted

## 2016-12-27 NOTE — Telephone Encounter (Signed)
Pt notified of results Verbalizes understanding 

## 2016-12-27 NOTE — Telephone Encounter (Signed)
-----   Message from Chevis Pretty, [place] sent at 12/26/2016  4:57 PM EDT ----- Carotid doppler study is ok

## 2017-01-13 ENCOUNTER — Other Ambulatory Visit: Payer: Self-pay | Admitting: Nurse Practitioner

## 2017-01-13 DIAGNOSIS — I10 Essential (primary) hypertension: Secondary | ICD-10-CM

## 2017-04-30 ENCOUNTER — Ambulatory Visit (INDEPENDENT_AMBULATORY_CARE_PROVIDER_SITE_OTHER): Payer: Medicare Other

## 2017-04-30 DIAGNOSIS — Z23 Encounter for immunization: Secondary | ICD-10-CM | POA: Diagnosis not present

## 2017-06-11 ENCOUNTER — Encounter: Payer: Self-pay | Admitting: Cardiology

## 2017-06-16 ENCOUNTER — Other Ambulatory Visit: Payer: Self-pay | Admitting: Nurse Practitioner

## 2017-06-16 DIAGNOSIS — I10 Essential (primary) hypertension: Secondary | ICD-10-CM

## 2017-06-17 NOTE — Progress Notes (Signed)
HPI The patient presents for evaluation. She has some mild aortic stenosis.  I last saw her in 2017. Her last echo was in 2014.  At the last visit she did have some cough and I took her off her ACE inhibitor.  Since I last saw her she has done well.  She goes up and down stairs.  She does her housework.  The patient denies any new symptoms such as chest discomfort, neck or arm discomfort. There has been no new shortness of breath, PND or orthopnea. There have been no reported palpitations, presyncope or syncope.      Allergies  Allergen Reactions  . Nitrofurantoin   . Sulfonamide Derivatives   . Lisinopril Cough    Current Outpatient Medications  Medication Sig Dispense Refill  . cetirizine (ZYRTEC ALLERGY) 10 MG tablet Take 1 tablet (10 mg total) by mouth daily. As needed 30 tablet 5  . Cholecalciferol (VITAMIN D3) 2000 UNITS TABS Take by mouth daily.      Marland Kitchen docusate sodium (COLACE) 100 MG capsule Take 100 mg by mouth. As needed    . fluticasone (FLONASE) 50 MCG/ACT nasal spray 2 SPRAYS NASALLY ONCE A DAY AS DIRECTED 16 g 5  . hydrochlorothiazide (HYDRODIURIL) 25 MG tablet Take 1 tablet (25 mg total) by mouth daily. 30 tablet 0  . losartan (COZAAR) 50 MG tablet Take 1.5 tablets (75 mg total) by mouth daily. 135 tablet 3  . pantoprazole (PROTONIX) 40 MG tablet Take 1 tablet (40 mg total) by mouth daily. 30 tablet 5  . Psyllium (METAMUCIL) 30.9 % POWD Take by mouth. At night    . Wheat Dextrin (BENEFIBER) POWD Take by mouth. Takes in the morning     No current facility-administered medications for this visit.     Past Medical History:  Diagnosis Date  . Aortic stenosis    Mild echo 2014  . Diverticulosis   . Duodenitis without mention of hemorrhage   . Esophagitis, unspecified   . IBS (irritable bowel syndrome)   . Uterine cancer [facility])     Past Surgical History:  Procedure Laterality Date  . FOOT SURGERY     bilateral   . VAGINAL HYSTERECTOMY      ROS:  As stated  in the HPI and negative for all other systems..  PHYSICAL EXAM BP (!) 150/81   Pulse 76   Ht 5\' 7"  (1.702 m)   Wt 176 lb (79.8 kg)   SpO2 97%   BMI 27.57 kg/m   GENERAL:  Well appearing NECK:  No jugular venous distention, waveform within normal limits, carotid upstroke brisk and symmetric, no bruits, no thyromegaly LUNGS:  Clear to auscultation bilaterally CHEST:  Unremarkable HEART:  PMI not displaced or sustained,S1 and S2 within normal limits, no S3, no S4, no clicks, no rubs, 3 of 6 apical systolic murmur mid peaking and radiating at the aortic outflow tract and into the carotids, no diastolic murmurs ABD:  Flat, positive bowel sounds normal in frequency in pitch, no bruits, no rebound, no guarding, no midline pulsatile mass, no hepatomegaly, no splenomegaly EXT:  2 plus pulses throughout, no edema, no cyanosis no clubbing    EKG: sinus rhythm, rate 69, left axis deviation, poor anterior R wave progression, no acute ST-T wave changes. 06/18/2017   ASSESSMENT AND PLAN  AORTIC STENOSIS:  Her aortic stenosis was mild to moderate on echo in 2014.  However, the murmur indicates slight progression.  I will follow up with  another echo.    HTN:  The blood pressure is not controlled.  I will add 25 mg additional Cozaar.    CAROTID STENOSIS:  This was mild earlier this year.  I reviewed these.  We can follow with imaging in a couple of years.

## 2017-06-18 ENCOUNTER — Encounter: Payer: Self-pay | Admitting: Cardiology

## 2017-06-18 ENCOUNTER — Ambulatory Visit: Payer: Medicare Other | Admitting: Cardiology

## 2017-06-18 VITALS — BP 150/81 | HR 76 | Ht 67.0 in | Wt 176.0 lb

## 2017-06-18 DIAGNOSIS — I10 Essential (primary) hypertension: Secondary | ICD-10-CM

## 2017-06-18 DIAGNOSIS — I359 Nonrheumatic aortic valve disorder, unspecified: Secondary | ICD-10-CM

## 2017-06-18 MED ORDER — LOSARTAN POTASSIUM 50 MG PO TABS: 75.0000 mg | ORAL_TABLET | Freq: Every day | ORAL | 3 refills | Status: DC

## 2017-06-18 NOTE — Patient Instructions (Signed)
Medication Instructions:  Please increase your Losartan to 75 mg a day. Continue all other medications as listed.  Testing/Procedures: Your physician has requested that you have an echocardiogram in 11/2017. Echocardiography is a painless test that uses sound waves to create images of your heart. It provides your doctor with information about the size and shape of your heart and how well your heart's chambers and valves are working. This procedure takes approximately one hour. There are no restrictions for this procedure.  Follow-Up: Follow up in 1 year with Dr. Percival Spanish.  You will receive a letter in the mail 2 months before you are due.  Please call us when you receive this letter to schedule your follow up appointment.  Thank you for choosing [name]!!

## 2017-07-12 ENCOUNTER — Other Ambulatory Visit: Payer: Self-pay | Admitting: Family

## 2017-07-12 DIAGNOSIS — I10 Essential (primary) hypertension: Secondary | ICD-10-CM

## 2017-07-17 ENCOUNTER — Telehealth: Payer: Self-pay | Admitting: Cardiology

## 2017-07-17 NOTE — Telephone Encounter (Signed)
Attempted to return call, phone line rings busy signal.

## 2017-07-17 NOTE — Telephone Encounter (Signed)
Michelle [name] is calling because she has a question about her medication (Lorsartan) ? Please call   Thanks

## 2017-07-17 NOTE — Telephone Encounter (Signed)
Spoke to pt. Her concern is related to an announced product recall for losartan and that "the TV told me I should call my doctor". As I verified w pharmD, the losartan recall only affects 1 manufacturer of this drug at present. I encouraged her to call her local pharmacy where she gets the medication and verify the manufactured origin of her pills. Pt declined to take this advice initially, stated she didn't know why I was telling her to contact her pharmacy when she was told to call her doctor. Explained the pharmacy would have record of their own supply chain, can address her concerns, and provide her different supply if needed. After several minutes of me explaining this to her, she eventually agreed to call her local pharmacy.  She was recently increased on this medication by Dr. Percival Spanish, and I did not advise stopping the medication. She asked about med alternatives, I informed her that w valsartan product recall she may have limited options, however I would defer to MD. Pt requests a call from either Dr. Percival Spanish or Jeannene Patella RN.  Will route for review.

## 2017-07-17 NOTE — Telephone Encounter (Signed)
She would not need to change her meds if the med is not affected.

## 2017-07-18 NOTE — Telephone Encounter (Signed)
Called and followed up with patient and the concerns she has r/t losartan.  Reassurance given and she does state she will take medication as rxed.  She also contacted her pharmacy who also advised the losartan manufacturer they use was not involved in the recall.  She was very grateful for the call and reassurance.

## 2017-08-22 ENCOUNTER — Encounter: Payer: Self-pay | Admitting: Nurse Practitioner

## 2017-08-22 ENCOUNTER — Ambulatory Visit (INDEPENDENT_AMBULATORY_CARE_PROVIDER_SITE_OTHER): Payer: Medicare Other | Admitting: Nurse Practitioner

## 2017-08-22 VITALS — BP 142/82 | HR 82 | Temp 97.0°F | Ht 67.0 in | Wt 175.0 lb

## 2017-08-22 DIAGNOSIS — I359 Nonrheumatic aortic valve disorder, unspecified: Secondary | ICD-10-CM | POA: Diagnosis not present

## 2017-08-22 DIAGNOSIS — K219 Gastro-esophageal reflux disease without esophagitis: Secondary | ICD-10-CM | POA: Diagnosis not present

## 2017-08-22 DIAGNOSIS — E559 Vitamin D deficiency, unspecified: Secondary | ICD-10-CM | POA: Diagnosis not present

## 2017-08-22 DIAGNOSIS — R350 Frequency of micturition: Secondary | ICD-10-CM

## 2017-08-22 DIAGNOSIS — Z6827 Body mass index (BMI) 27.0-27.9, adult: Secondary | ICD-10-CM | POA: Diagnosis not present

## 2017-08-22 DIAGNOSIS — M8580 Other specified disorders of bone density and structure, unspecified site: Secondary | ICD-10-CM

## 2017-08-22 DIAGNOSIS — I10 Essential (primary) hypertension: Secondary | ICD-10-CM | POA: Diagnosis not present

## 2017-08-22 DIAGNOSIS — N3090 Cystitis, unspecified without hematuria: Secondary | ICD-10-CM | POA: Diagnosis not present

## 2017-08-22 LAB — URINALYSIS, COMPLETE
Bilirubin, UA: NEGATIVE
Glucose, UA: NEGATIVE
Nitrite, UA: NEGATIVE
PH UA: 7.5 (ref 5.0–7.5)
PROTEIN UA: NEGATIVE
RBC, UA: NEGATIVE
Specific Gravity, UA: 1.015 (ref 1.005–1.030)
UUROB: 0.2 mg/dL (ref 0.2–1.0)

## 2017-08-22 LAB — MICROSCOPIC EXAMINATION
RBC MICROSCOPIC, UA: NONE SEEN /HPF (ref 0–?)
RENAL EPITHEL UA: NONE SEEN /HPF

## 2017-08-22 MED ORDER — PANTOPRAZOLE SODIUM 40 MG PO TBEC: 40.0000 mg | DELAYED_RELEASE_TABLET | Freq: Every day | ORAL | 1 refills | Status: DC

## 2017-08-22 MED ORDER — HYDROCHLOROTHIAZIDE 25 MG PO TABS: 25.0000 mg | ORAL_TABLET | Freq: Every day | ORAL | 5 refills | Status: DC

## 2017-08-22 MED ORDER — PANTOPRAZOLE SODIUM 40 MG PO TBEC: 40.0000 mg | DELAYED_RELEASE_TABLET | Freq: Every day | ORAL | 5 refills | Status: DC

## 2017-08-22 MED ORDER — HYDROCHLOROTHIAZIDE 25 MG PO TABS: 25.0000 mg | ORAL_TABLET | Freq: Every day | ORAL | 1 refills | Status: DC

## 2017-08-22 MED ORDER — LOSARTAN POTASSIUM 50 MG PO TABS: 75.0000 mg | ORAL_TABLET | Freq: Every day | ORAL | 3 refills | Status: DC

## 2017-08-22 NOTE — Patient Instructions (Signed)

## 2017-08-22 NOTE — Progress Notes (Signed)
Subjective:    Patient ID: Michelle [name], female    DOB: [birth year], 84 y.o.   MRN: [id number]  HPI  Michelle [name] is here today for follow up of chronic medical problem.  Outpatient Encounter Medications as of 08/22/2017  Medication Sig  . cetirizine (ZYRTEC ALLERGY) 10 MG tablet Take 1 tablet (10 mg total) by mouth daily. As needed  . Cholecalciferol (VITAMIN D3) 2000 UNITS TABS Take by mouth daily.    Marland Kitchen docusate sodium (COLACE) 100 MG capsule Take 100 mg by mouth. As needed  . fluticasone (FLONASE) 50 MCG/ACT nasal spray 2 SPRAYS NASALLY ONCE A DAY AS DIRECTED  . hydrochlorothiazide (HYDRODIURIL) 25 MG tablet TAKE 1 TABLET DAILY  . losartan (COZAAR) 50 MG tablet Take 1.5 tablets (75 mg total) by mouth daily.  . pantoprazole (PROTONIX) 40 MG tablet Take 1 tablet (40 mg total) by mouth daily.  . Psyllium (METAMUCIL) 30.9 % POWD Take by mouth. At night  . Wheat Dextrin (BENEFIBER) POWD Take by mouth. Takes in the morning     1. Aortic valve disorder  saw cardiology on June 18, 2017. She was doing well. No changes made to care and was told to follow up in one year  2. Essential hypertension  No c/o chest pain, sob or headache. She occasionally checks her blood pressure at home. runsin 814'G systolic  3. Osteopenia, unspecified location  Does very little weight bearing exercise. Says she has occasional back pain. Last dexscan ws in 2014. Patient declines having anymore.  4. Cystitis -patient has chronic cystitis and always wants urine checked while she is here. Has not seen urology in years. She does c/o urinary frequency and wants urine checked.  5. Vitamin D deficiency  Does not always remember to take her vitamin d  6. BMI 27.0-27.9,adult  No recent weight changes    New complaints: None today  Social history: husband is still living- her daughter visits often.    Review of Systems  Constitutional: Negative for activity change and appetite change.  HENT: Negative.     Eyes: Negative for pain.  Respiratory: Negative for shortness of breath.   Cardiovascular: Negative for chest pain, palpitations and leg swelling.  Gastrointestinal: Negative for abdominal pain.  Endocrine: Negative for polydipsia.  Genitourinary: Negative.   Skin: Negative for rash.  Neurological: Negative for dizziness, weakness and headaches.  Hematological: Does not bruise/bleed easily.  Psychiatric/Behavioral: Negative.   All other systems reviewed and are negative.      Objective:   Physical Exam  Constitutional: She is oriented to person, place, and time. She appears well-developed and well-nourished.  HENT:  Nose: Nose normal.  Mouth/Throat: Oropharynx is clear and moist.  Eyes: EOM are normal.  Neck: Trachea normal, normal range of motion and full passive range of motion without pain. Neck supple. No JVD present. Carotid bruit is not present. No thyromegaly present.  Cardiovascular: Normal rate, regular rhythm, normal heart sounds and intact distal pulses. Exam reveals no gallop and no friction rub.  No murmur heard. Pulmonary/Chest: Effort normal and breath sounds normal.  Abdominal: Soft. Bowel sounds are normal. She exhibits no distension and no mass. There is no tenderness.  Musculoskeletal: Normal range of motion.  Lymphadenopathy:    She has no cervical adenopathy.  Neurological: She is alert and oriented to person, place, and time. She has normal reflexes.  Skin: Skin is warm and dry.  Psychiatric: She has a normal mood and affect. Her behavior is normal.  Judgment and thought content normal.   BP (!) 142/82 (BP Location: Left Arm, Cuff Size: Normal)   Pulse 82   Temp (!) 97 F (36.1 C) (Oral)   Ht 5\' 7"  (1.702 m)   Wt 175 lb (79.4 kg)   BMI 27.41 kg/m   Urine clear     Assessment & Plan:  1. Aortic valve disorder Keep follow up appointment with cardiology  2. Essential hypertension Low sodiumdiet - hydrochlorothiazide (HYDRODIURIL) 25 MG tablet;  Take 1 tablet (25 mg total) by mouth daily.  Dispense: 30 tablet; Refill: 5 - losartan (COZAAR) 50 MG tablet; Take 1.5 tablets (75 mg total) by mouth daily.  Dispense: 135 tablet; Refill: 3  3. Osteopenia, unspecified location Weight bearing exercises encouraged  4. Cystitis Force fluids  5. Vitamin D deficiency Continue vitain d otc  6. BMI 27.0-27.9,adult Discussed diet and exercise for person with BMI >25 Will recheck weight in 3-6 months  7. Frequent urination Urine clear - Urinalysis, Complete  8. Gastroesophageal reflux disease without esophagitis Avoid spicy foods Do not eat 2 hours prior to bedtime - pantoprazole (PROTONIX) 40 MG tablet; Take 1 tablet (40 mg total) by mouth daily.  Dispense: 30 tablet; Refill: 5    Labs pending Health maintenance reviewed Diet and exercise encouraged Continue all meds Follow up  In 3 months   [place], FNP

## 2017-08-22 NOTE — Addendum Note (Signed)
Addended by: Chevis Pretty on: 08/22/2017 02:45 PM   Modules accepted: Orders

## 2017-08-22 NOTE — Addendum Note (Signed)
Addended by: Chevis Pretty on: 08/22/2017 02:41 PM   Modules accepted: Orders

## 2017-08-23 LAB — CMP14+EGFR
A/G RATIO: 1.8 (ref 1.2–2.2)
ALBUMIN: 4.2 g/dL (ref 3.5–4.7)
ALT: 42 IU/L — ABNORMAL HIGH (ref 0–32)
AST: 47 IU/L — AB (ref 0–40)
Alkaline Phosphatase: 62 IU/L (ref 39–117)
BUN / CREAT RATIO: 15 (ref 12–28)
BUN: 11 mg/dL (ref 8–27)
Bilirubin Total: 0.5 mg/dL (ref 0.0–1.2)
CALCIUM: 9.4 mg/dL (ref 8.7–10.3)
CO2: 24 mmol/L (ref 20–29)
Chloride: 100 mmol/L (ref 96–106)
Creatinine, Ser: 0.75 mg/dL (ref 0.57–1.00)
GFR, EST AFRICAN AMERICAN: 86 mL/min/{1.73_m2} (ref >59)
GFR, EST NON AFRICAN AMERICAN: 75 mL/min/{1.73_m2} (ref >59)
GLOBULIN, TOTAL: 2.3 g/dL (ref 1.5–4.5)
Glucose: 96 mg/dL (ref 65–99)
POTASSIUM: 3.7 mmol/L (ref 3.5–5.2)
SODIUM: 142 mmol/L (ref 134–144)
TOTAL PROTEIN: 6.5 g/dL (ref 6.0–8.5)

## 2017-08-23 LAB — LIPID PANEL
CHOL/HDL RATIO: 3.3 ratio (ref 0.0–4.4)
Cholesterol, Total: 227 mg/dL — ABNORMAL HIGH (ref 100–199)
HDL: 69 mg/dL (ref >39)
LDL CALC: 147 mg/dL — AB (ref 0–99)
Triglycerides: 56 mg/dL (ref 0–149)
VLDL Cholesterol Cal: 11 mg/dL (ref 5–40)

## 2017-11-06 ENCOUNTER — Other Ambulatory Visit: Payer: Self-pay | Admitting: Orthopedic Surgery

## 2017-11-06 ENCOUNTER — Other Ambulatory Visit (INDEPENDENT_AMBULATORY_CARE_PROVIDER_SITE_OTHER): Payer: Medicare Other

## 2017-11-06 DIAGNOSIS — M1711 Unilateral primary osteoarthritis, right knee: Secondary | ICD-10-CM | POA: Diagnosis not present

## 2017-11-06 DIAGNOSIS — R52 Pain, unspecified: Secondary | ICD-10-CM

## 2017-11-06 DIAGNOSIS — M25561 Pain in right knee: Secondary | ICD-10-CM

## 2017-11-17 ENCOUNTER — Ambulatory Visit (HOSPITAL_COMMUNITY)
Admission: RE | Admit: 2017-11-17 | Discharge: 2017-11-17 | Disposition: A | Discharge status: Home / Self Care | Payer: Medicare Other | Source: Ambulatory Visit | Attending: Cardiology | Admitting: Cardiology

## 2017-11-17 DIAGNOSIS — I080 Rheumatic disorders of both mitral and aortic valves: Secondary | ICD-10-CM | POA: Diagnosis not present

## 2017-11-17 DIAGNOSIS — I359 Nonrheumatic aortic valve disorder, unspecified: Secondary | ICD-10-CM

## 2017-11-17 DIAGNOSIS — I10 Essential (primary) hypertension: Secondary | ICD-10-CM | POA: Insufficient documentation

## 2017-11-17 NOTE — Progress Notes (Signed)
*  PRELIMINARY RESULTS* Echocardiogram 2D Echocardiogram has been performed.  Leavy Cella 11/17/2017, 1:00 PM

## 2017-11-18 ENCOUNTER — Encounter: Payer: Self-pay | Admitting: Physician Assistant

## 2017-11-18 ENCOUNTER — Ambulatory Visit (INDEPENDENT_AMBULATORY_CARE_PROVIDER_SITE_OTHER): Payer: Medicare Other | Admitting: Physician Assistant

## 2017-11-18 VITALS — BP 158/74 | HR 83 | Temp 98.1°F | Ht 67.0 in | Wt 171.8 lb

## 2017-11-18 DIAGNOSIS — R300 Dysuria: Secondary | ICD-10-CM | POA: Diagnosis not present

## 2017-11-18 DIAGNOSIS — N3000 Acute cystitis without hematuria: Secondary | ICD-10-CM

## 2017-11-18 LAB — MICROSCOPIC EXAMINATION: Renal Epithel, UA: NONE SEEN /hpf

## 2017-11-18 LAB — URINALYSIS, COMPLETE
Bilirubin, UA: NEGATIVE
Glucose, UA: NEGATIVE
Nitrite, UA: NEGATIVE
RBC, UA: NEGATIVE
Specific Gravity, UA: 1.015 (ref 1.005–1.030)
Urobilinogen, Ur: 0.2 mg/dL (ref 0.2–1.0)
pH, UA: 7.5 (ref 5.0–7.5)

## 2017-11-18 MED ORDER — CIPROFLOXACIN HCL 250 MG PO TABS: 500.0000 mg | ORAL_TABLET | Freq: Two times a day (BID) | ORAL | 0 refills | Status: DC

## 2017-11-18 MED ORDER — CIPROFLOXACIN HCL 250 MG PO TABS: 250.0000 mg | ORAL_TABLET | Freq: Two times a day (BID) | ORAL | 0 refills | Status: DC

## 2017-11-18 NOTE — Patient Instructions (Signed)
In a few days you may receive a survey in the mail or online from [employer] regarding your visit with us today. Please take a moment to fill this out. Your feedback is very important to our whole office. It can help us better understand your needs as well as improve your experience and satisfaction. Thank you for taking your time to complete it. We care about you.  Dorothea Yow, PA-C  

## 2017-11-18 NOTE — Progress Notes (Addendum)
BP (!) 158/74   Pulse 83   Temp 98.1 F (36.7 C) (Oral)   Ht 5\' 7"  (1.702 m)   Wt 171 lb 12.8 oz (77.9 kg)   BMI 26.91 kg/m    Subjective:    Patient ID: Michelle [name], female    DOB: 27-Oct-1933, 84 y.o.   MRN: [id number]  HPI: Michelle [name] is a 84 y.o. female presenting on 11/18/2017 for Urinary Tract Infection and Urinary Frequency  This patient has had several days of dysuria, frequency and nocturia. There is also pain over the bladder in the suprapubic region, no back pain. Denies leakage or hematuria.  Denies fever or chills. No pain in flank area.   Past Medical History:  Diagnosis Date  . Aortic stenosis    Mild echo 2014  . Diverticulosis   . Duodenitis without mention of hemorrhage   . Esophagitis, unspecified   . IBS (irritable bowel syndrome)   . Uterine cancer ([place])    Relevant past medical, surgical, family and social history reviewed and updated as indicated. Interim medical history since our last visit reviewed. Allergies and medications reviewed and updated. DATA REVIEWED: CHART IN EPIC  Family History reviewed for pertinent findings.  Review of Systems  Constitutional: Negative.   HENT: Negative.   Eyes: Negative.   Respiratory: Negative.   Gastrointestinal: Negative.   Genitourinary: Positive for difficulty urinating, dysuria, enuresis and urgency. Negative for flank pain.    Allergies as of 11/18/2017      Reactions   Nitrofurantoin    Sulfonamide Derivatives    Lisinopril Cough      Medication List        Accurate as of 11/18/17  3:04 PM. Always use your most recent med list.          BENEFIBER Powd Take by mouth. Takes in the morning   cetirizine 10 MG tablet Commonly known as:  ZYRTEC ALLERGY Take 1 tablet (10 mg total) by mouth daily. As needed   ciprofloxacin 250 MG tablet Commonly known as:  CIPRO Take 2 tablets (500 mg total) by mouth 2 (two) times daily.   COLACE 100 MG capsule Generic drug:  docusate sodium Take 100 mg  by mouth. As needed   fluticasone 50 MCG/ACT nasal spray Commonly known as:  FLONASE 2 SPRAYS NASALLY ONCE A DAY AS DIRECTED   hydrochlorothiazide 25 MG tablet Commonly known as:  HYDRODIURIL Take 1 tablet (25 mg total) by mouth daily.   losartan 50 MG tablet Commonly known as:  COZAAR Take 1.5 tablets (75 mg total) by mouth daily.   METAMUCIL 30.9 % Powd Generic drug:  Psyllium Take by mouth. At night   pantoprazole 40 MG tablet Commonly known as:  PROTONIX Take 1 tablet (40 mg total) by mouth daily.   Vitamin D3 2000 units Tabs Take by mouth daily.          Objective:    BP (!) 158/74   Pulse 83   Temp 98.1 F (36.7 C) (Oral)   Ht 5\' 7"  (1.702 m)   Wt 171 lb 12.8 oz (77.9 kg)   BMI 26.91 kg/m   Allergies  Allergen Reactions  . Nitrofurantoin   . Sulfonamide Derivatives   . Lisinopril Cough    Wt Readings from Last 3 Encounters:  11/18/17 171 lb 12.8 oz (77.9 kg)  08/22/17 175 lb (79.4 kg)  06/18/17 176 lb (79.8 kg)    Physical Exam  Constitutional: She is oriented to person,  place, and time. She appears well-developed and well-nourished.  HENT:  Head: Normocephalic and atraumatic.  Eyes: Pupils are equal, round, and reactive to light. Conjunctivae are normal.  Cardiovascular: Normal rate, regular rhythm, normal heart sounds and intact distal pulses.  Pulmonary/Chest: Effort normal and breath sounds normal.  Abdominal: Soft. Bowel sounds are normal. She exhibits no distension and no mass. There is tenderness in the suprapubic area. There is no rebound, no guarding and no CVA tenderness.  Neurological: She is alert and oriented to person, place, and time. She has normal reflexes.  Skin: Skin is warm and dry. No rash noted.  Psychiatric: She has a normal mood and affect. Her behavior is normal. Judgment and thought content normal.        Assessment & Plan:   1. Dysuria - Urine Culture - Urinalysis, Complete  2. Acute cystitis without hematuria -  ciprofloxacin (CIPRO) 250 MG tablet; Take 1 tablets (250 mg total) by mouth 2 (two) times daily.  Dispense: 20 tablet; Refill: 0  Continue all other maintenance medications as listed above.  Follow up plan: No follow-ups on file.  Educational handout given for  [place] PA-C [facility] [address]  [place], [place] [address] [phone]   11/18/2017, 3:04 PM

## 2017-11-18 NOTE — Addendum Note (Signed)
Addended by: Terald Sleeper on: 11/18/2017 03:19 PM   Modules accepted: Orders

## 2017-11-19 LAB — URINE CULTURE: Organism ID, Bacteria: NO GROWTH

## 2017-12-16 ENCOUNTER — Telehealth: Payer: Self-pay | Admitting: Cardiology

## 2017-12-16 NOTE — Telephone Encounter (Signed)
Returned call to patient, patient states she would like to take something besides losartan. She states this medication "is going against me".   She states this is about the only prescription medication she takes and she believes all of her problems are coming from this medication.   She states her dose was increased in December and all of her "issues" worsened.    Reports concerns with bumps on face, "don't feel good", frequent UTI, and continues to state there are "multiple things".   She has decided she does not want to take losartan but is willing to try an alternative to see if all of her issues improve.   She doesn't take BP at home but states BP is usually stable at her appts.     Advised I would route to MD for review.

## 2017-12-16 NOTE — Telephone Encounter (Signed)
New message   Pt c/o medication issue:  1. Name of Medication: losartan (COZAAR) 50 MG tablet  2. How are you currently taking this medication (dosage and times per day)? Take 1.5 tablets (75 mg total) by mouth daily.  3. Are you having a reaction (difficulty breathing--STAT)? No 4. What is your medication issue? Patient is requesting medication change

## 2017-12-17 NOTE — Telephone Encounter (Signed)
Follow Up:    Pt calling wanting to know what Dr Percival Spanish wants her to do about her blood pressure medicine.

## 2017-12-17 NOTE — Telephone Encounter (Signed)
We could try Norvasc 5 mg po daily and stop the Cozaar.  Norvasc 5 mg once daily Disp number 30 with 11 refills.

## 2017-12-18 MED ORDER — AMLODIPINE BESYLATE 5 MG PO TABS: 5.0000 mg | ORAL_TABLET | Freq: Every day | ORAL | 3 refills | Status: DC

## 2017-12-18 NOTE — Telephone Encounter (Signed)
Spoke with patient who is aware to stop Cozaar and start Amlodipine 5 mg daily.  She will c/b if no improvement in s/s.

## 2017-12-27 ENCOUNTER — Other Ambulatory Visit: Payer: Self-pay

## 2017-12-27 ENCOUNTER — Emergency Department (HOSPITAL_COMMUNITY): Payer: Medicare Other

## 2017-12-27 ENCOUNTER — Encounter (HOSPITAL_COMMUNITY): Payer: Self-pay

## 2017-12-27 ENCOUNTER — Emergency Department (HOSPITAL_COMMUNITY)
Admission: EM | Admit: 2017-12-27 | Discharge: 2017-12-27 | Disposition: A | Discharge status: Home / Self Care | Payer: Medicare Other | Attending: Emergency Medicine | Admitting: Emergency Medicine

## 2017-12-27 DIAGNOSIS — Z79899 Other long term (current) drug therapy: Secondary | ICD-10-CM | POA: Insufficient documentation

## 2017-12-27 DIAGNOSIS — Z8542 Personal history of malignant neoplasm of other parts of uterus: Secondary | ICD-10-CM | POA: Insufficient documentation

## 2017-12-27 DIAGNOSIS — K5641 Fecal impaction: Secondary | ICD-10-CM | POA: Insufficient documentation

## 2017-12-27 DIAGNOSIS — K5900 Constipation, unspecified: Secondary | ICD-10-CM

## 2017-12-27 LAB — CBC WITH DIFFERENTIAL/PLATELET
BASOS ABS: 0 10*3/uL (ref 0.0–0.1)
BASOS PCT: 0 %
EOS ABS: 0 10*3/uL (ref 0.0–0.7)
Eosinophils Relative: 0 %
HEMATOCRIT: 37.2 % (ref 36.0–46.0)
HEMOGLOBIN: 12.2 g/dL (ref 12.0–15.0)
Lymphocytes Relative: 6 %
Lymphs Abs: 0.4 10*3/uL — ABNORMAL LOW (ref 0.7–4.0)
MCH: 30.2 pg (ref 26.0–34.0)
MCHC: 32.8 g/dL (ref 30.0–36.0)
MCV: 92.1 fL (ref 78.0–100.0)
Monocytes Absolute: 0.5 10*3/uL (ref 0.1–1.0)
Monocytes Relative: 7 %
NEUTROS ABS: 6.1 10*3/uL (ref 1.7–7.7)
Neutrophils Relative %: 87 %
Platelets: 181 10*3/uL (ref 150–400)
RBC: 4.04 MIL/uL (ref 3.87–5.11)
RDW: 13.7 % (ref 11.5–15.5)
WBC: 7.1 10*3/uL (ref 4.0–10.5)

## 2017-12-27 LAB — COMPREHENSIVE METABOLIC PANEL
ALBUMIN: 4.2 g/dL (ref 3.5–5.0)
ALK PHOS: 50 U/L (ref 38–126)
ALT: 28 U/L (ref 14–54)
ANION GAP: 10 (ref 5–15)
AST: 34 U/L (ref 15–41)
BILIRUBIN TOTAL: 0.8 mg/dL (ref 0.3–1.2)
BUN: 20 mg/dL (ref 6–20)
CALCIUM: 9.1 mg/dL (ref 8.9–10.3)
CO2: 29 mmol/L (ref 22–32)
Chloride: 93 mmol/L — ABNORMAL LOW (ref 101–111)
Creatinine, Ser: 0.82 mg/dL (ref 0.44–1.00)
GFR calc Af Amer: >60 mL/min (ref >60)
GFR calc non Af Amer: >60 mL/min (ref >60)
GLUCOSE: 121 mg/dL — AB (ref 65–99)
Potassium: 3.6 mmol/L (ref 3.5–5.1)
SODIUM: 132 mmol/L — AB (ref 135–145)
TOTAL PROTEIN: 6.8 g/dL (ref 6.5–8.1)

## 2017-12-27 LAB — URINALYSIS, ROUTINE W REFLEX MICROSCOPIC
Bilirubin Urine: NEGATIVE
GLUCOSE, UA: NEGATIVE mg/dL
Hgb urine dipstick: NEGATIVE
KETONES UR: NEGATIVE mg/dL
LEUKOCYTES UA: NEGATIVE
NITRITE: NEGATIVE
PH: 9 — AB (ref 5.0–8.0)
Protein, ur: NEGATIVE mg/dL
SPECIFIC GRAVITY, URINE: 1.008 (ref 1.005–1.030)

## 2017-12-27 LAB — LIPASE, BLOOD: Lipase: 32 U/L (ref 11–51)

## 2017-12-27 MED ORDER — POLYETHYLENE GLYCOL 3350 17 G PO PACK: 17.0000 g | PACK | Freq: Every day | ORAL | 0 refills | Status: AC

## 2017-12-27 MED ORDER — DICYCLOMINE HCL 20 MG PO TABS: 20.0000 mg | ORAL_TABLET | Freq: Two times a day (BID) | ORAL | 0 refills | Status: DC | PRN

## 2017-12-27 MED ORDER — FLEET ENEMA 7-19 GM/118ML RE ENEM
1.0000 | ENEMA | Freq: Once | RECTAL | Status: AC
  Administered 2017-12-27: 1 via RECTAL

## 2017-12-27 NOTE — ED Triage Notes (Signed)
Pt has been constipated for several days. 2 days ago pt took stool softeners. Still was not able to have a BM. Last night pt took [place]. Pt states she is hurting in her rectum. Pt is having abdominal bloating.

## 2017-12-27 NOTE — ED Notes (Signed)
Pt has had two small episodes of stool. MD made aware.

## 2017-12-27 NOTE — ED Provider Notes (Signed)
[facility] EMERGENCY DEPARTMENT Provider Note   CSN: [id number] Arrival date & time: 12/27/17  1419     History   Chief Complaint Chief Complaint  Patient presents with  . Constipation    HPI Michelle [name] is a 84 y.o. female.  HPI Patient presents with 4 days of constipation.  She is had abdominal distention, abdominal pain and rectal pain.  Has taken Colace and milk of magnesia with no stool production.  No nausea or vomiting.  Patient states she has passed a small amount of gas.  Denies any fever or chills.  Patient states she is having difficulty urinating but denies hematuria or dysuria. Past Medical History:  Diagnosis Date  . Aortic stenosis    Mild echo 2014  . Diverticulosis   . Duodenitis without mention of hemorrhage   . Esophagitis, unspecified   . IBS (irritable bowel syndrome)   . Uterine cancer [facility])     Patient Active Problem List   Diagnosis Date Noted  . CN (constipation) 12/01/2014  . Vitamin D deficiency 12/01/2014  . BMI 27.0-27.9,adult 12/01/2014  . Cystitis 01/27/2014  . Seasonal allergic rhinitis 07/07/2013  . Osteopenia 03/10/2013  . Hypertension 02/11/2013  . Aortic valve disorder 11/24/2009    Past Surgical History:  Procedure Laterality Date  . FOOT SURGERY     bilateral   . VAGINAL HYSTERECTOMY       OB History   None      Home Medications    Prior to Admission medications   Medication Sig Start Date End Date Taking? Authorizing Provider  amLODipine (NORVASC) 5 MG tablet Take 1 tablet (5 mg total) by mouth daily. 12/18/17  Yes Minus Breeding, MD  cetirizine (ZYRTEC ALLERGY) 10 MG tablet Take 1 tablet (10 mg total) by mouth daily. As needed 11/27/16  Yes Hassell Done, Mary-Margaret, FNP  Cholecalciferol (VITAMIN D3) 2000 UNITS TABS Take by mouth daily.     Yes [provider]  docusate sodium (COLACE) 100 MG capsule Take 100 mg by mouth. As needed   Yes [provider]  fluticasone (FLONASE) 50 MCG/ACT nasal  spray 2 SPRAYS NASALLY ONCE A DAY AS DIRECTED 11/27/16  Yes Hassell Done, Mary-Margaret, FNP  hydrochlorothiazide (HYDRODIURIL) 25 MG tablet Take 1 tablet (25 mg total) by mouth daily. 08/22/17  Yes Martin, Mary-Margaret, FNP  naproxen sodium (ALEVE) 220 MG tablet Take 220 mg by mouth as needed (pain).   Yes [provider]  pantoprazole (PROTONIX) 40 MG tablet Take 1 tablet (40 mg total) by mouth daily. 08/22/17  Yes Martin, Mary-Margaret, FNP  Psyllium (METAMUCIL) 30.9 % POWD Take by mouth. At night   Yes [provider]  Wheat Dextrin (BENEFIBER) POWD Take by mouth. Takes in the morning   Yes [provider]  acetaminophen (TYLENOL) 500 MG tablet Take 500 mg by mouth every 6 (six) hours as needed for headache.    [provider]  dicyclomine (BENTYL) 20 MG tablet Take 1 tablet (20 mg total) by mouth 2 (two) times daily as needed for spasms. 12/27/17   Julianne Rice, MD  polyethylene glycol [facility] / Floria Raveling) packet Take 17 g by mouth daily. 12/27/17   Julianne Rice, MD    Family History Family History  Problem Relation Age of Onset  . Heart disease Mother 8       CHF  . COPD Father   . Eczema Father   . Colon cancer Unknown        Per patient unsure of  what cancer in family     Social History Social History   Tobacco Use  . Smoking status: Never Smoker  . Smokeless tobacco: Never Used  Substance Use Topics  . Alcohol use: No  . Drug use: No     Allergies   Nitrofurantoin; Sulfonamide derivatives; and Lisinopril   Review of Systems Review of Systems  Constitutional: Negative for chills and fever.  HENT: Negative for trouble swallowing.   Respiratory: Negative for cough and shortness of breath.   Cardiovascular: Negative for chest pain.  Gastrointestinal: Positive for abdominal distention, abdominal pain, constipation and rectal pain. Negative for blood in stool, diarrhea, nausea and vomiting.  Genitourinary: Positive for difficulty  urinating. Negative for dysuria, flank pain, frequency, hematuria and pelvic pain.  Musculoskeletal: Negative for back pain, myalgias, neck pain and neck stiffness.  Skin: Negative for rash and wound.  Neurological: Negative for dizziness, weakness, light-headedness, numbness and headaches.  All other systems reviewed and are negative.    Physical Exam Updated Vital Signs BP 131/63 (BP Location: Right Arm)   Pulse 86   Temp 98.7 F (37.1 C) (Oral)   Resp 16   Ht 5\' 7"  (1.702 m)   Wt 77.1 kg (170 lb)   SpO2 99%   BMI 26.63 kg/m   Physical Exam  Constitutional: She is oriented to person, place, and time. She appears well-developed and well-nourished.  HENT:  Head: Normocephalic and atraumatic.  Mouth/Throat: Oropharynx is clear and moist.  Eyes: Pupils are equal, round, and reactive to light. EOM are normal.  Neck: Normal range of motion. Neck supple.  Cardiovascular: Normal rate and regular rhythm.  Pulmonary/Chest: Effort normal and breath sounds normal.  Abdominal: Soft. Bowel sounds are normal. She exhibits distension. There is tenderness. There is no rebound and no guarding.  Mild suprapubic discomfort tender to palpation.  No rebound or guarding.  Genitourinary:  Genitourinary Comments: Stool in the rectal vault.  No obvious hemorrhoids, perirectal masses.  No gross blood.  Musculoskeletal: Normal range of motion. She exhibits no edema or tenderness.  No CVA tenderness.  Neurological: She is alert and oriented to person, place, and time.  Skin: Skin is warm and dry. No rash noted. No erythema.  Psychiatric: She has a normal mood and affect. Her behavior is normal.  Nursing note and vitals reviewed.    ED Treatments / Results  Labs (all labs ordered are listed, but only abnormal results are displayed) Labs Reviewed  CBC WITH DIFFERENTIAL/PLATELET - Abnormal; Notable for the following components:      Result Value   Lymphs Abs 0.4 (*)    All other components within  normal limits  COMPREHENSIVE METABOLIC PANEL - Abnormal; Notable for the following components:   Sodium 132 (*)    Chloride 93 (*)    Glucose, Bld 121 (*)    All other components within normal limits  URINALYSIS, ROUTINE W REFLEX MICROSCOPIC - Abnormal; Notable for the following components:   pH 9.0 (*)    All other components within normal limits  LIPASE, BLOOD    EKG None  Radiology Dg Abd Acute W/chest  Result Date: 12/27/2017 CLINICAL DATA:  Constipation for several days.  Abdominal bloating. EXAM: DG ABDOMEN ACUTE W/ 1V CHEST COMPARISON:  CT 10/02/2010 FINDINGS: Frontal view of the chest demonstrates midline trachea. Normal heart size. Atherosclerosis in the transverse aorta. No pleural effusion or pneumothorax. Clear lungs. Convex right thoracolumbar spine curvature. Abdominal films demonstrate no free intraperitoneal air on upright positioning. Air-fluid  levels within the right-sided abdomen are favored to be in small bowel loops. Supine view demonstrates a large amount of colonic stool, with a large stool ball in the rectum. Normal caliber gas-filled small bowel loops. No abnormal abdominal calcifications. No appendicolith. Aortic atherosclerosis. IMPRESSION: No acute cardiopulmonary disease. Aortic Atherosclerosis (ICD10-I70.0). Nonspecific small bowel air-fluid levels within the right upper quadrant. Constipation and probable fecal impaction. Electronically Signed   By: Abigail Miyamoto M.D.   On: 12/27/2017 17:08    Procedures Procedures (including critical care time)  Medications Ordered in ED Medications  sodium phosphate (FLEET) 7-19 GM/118ML enema 1 enema (1 enema Rectal Given 12/27/17 1747)     Initial Impression / Assessment and Plan / ED Course  I have reviewed the triage vital signs and the nursing notes.  Pertinent labs & imaging results that were available during my care of the patient were reviewed by me and considered in my medical decision making (see chart for  details).     Patient was manually disimpacted.  Tolerated well.  Will give enema.  Low suspicion for obstruction.  Patient had 2 small bowel movements after enema.  Abdominal exam is benign.  Will discharge home with MiraLAX and advised to follow-up with her primary physician.  Return precautions given. Final Clinical Impressions(s) / ED Diagnoses   Final diagnoses:  Fecal impaction in rectum (HCC)  Constipation, unspecified constipation type    ED Discharge Orders        Ordered    polyethylene glycol (MIRALAX / GLYCOLAX) packet  Daily     12/27/17 1931    dicyclomine (BENTYL) 20 MG tablet  2 times daily PRN     12/27/17 1931       Julianne Rice, MD 12/27/17 1949

## 2018-02-25 ENCOUNTER — Telehealth: Payer: Self-pay | Admitting: Family Medicine

## 2018-02-27 ENCOUNTER — Ambulatory Visit: Payer: Medicare Other | Admitting: Urology

## 2018-02-27 DIAGNOSIS — N3946 Mixed incontinence: Secondary | ICD-10-CM | POA: Diagnosis not present

## 2018-02-27 DIAGNOSIS — N952 Postmenopausal atrophic vaginitis: Secondary | ICD-10-CM | POA: Diagnosis not present

## 2018-02-27 DIAGNOSIS — N816 Rectocele: Secondary | ICD-10-CM

## 2018-03-11 ENCOUNTER — Other Ambulatory Visit: Payer: Self-pay | Admitting: Nurse Practitioner

## 2018-03-11 DIAGNOSIS — J302 Other seasonal allergic rhinitis: Secondary | ICD-10-CM

## 2018-03-30 DIAGNOSIS — L728 Other follicular cysts of the skin and subcutaneous tissue: Secondary | ICD-10-CM | POA: Diagnosis not present

## 2018-03-30 DIAGNOSIS — L821 Other seborrheic keratosis: Secondary | ICD-10-CM | POA: Diagnosis not present

## 2018-03-30 DIAGNOSIS — Z1283 Encounter for screening for malignant neoplasm of skin: Secondary | ICD-10-CM | POA: Diagnosis not present

## 2018-03-30 DIAGNOSIS — L570 Actinic keratosis: Secondary | ICD-10-CM | POA: Diagnosis not present

## 2018-03-30 DIAGNOSIS — D225 Melanocytic nevi of trunk: Secondary | ICD-10-CM | POA: Diagnosis not present

## 2018-04-02 ENCOUNTER — Encounter: Payer: Self-pay | Admitting: Family

## 2018-04-02 ENCOUNTER — Ambulatory Visit (INDEPENDENT_AMBULATORY_CARE_PROVIDER_SITE_OTHER): Payer: Medicare Other | Admitting: Family

## 2018-04-02 VITALS — BP 147/74 | HR 78 | Temp 97.8°F | Ht 67.0 in | Wt 163.5 lb

## 2018-04-02 DIAGNOSIS — J0100 Acute maxillary sinusitis, unspecified: Secondary | ICD-10-CM | POA: Diagnosis not present

## 2018-04-02 DIAGNOSIS — H6123 Impacted cerumen, bilateral: Secondary | ICD-10-CM | POA: Diagnosis not present

## 2018-04-02 DIAGNOSIS — J302 Other seasonal allergic rhinitis: Secondary | ICD-10-CM

## 2018-04-02 MED ORDER — AMOXICILLIN-POT CLAVULANATE 875-125 MG PO TABS: 1.0000 | ORAL_TABLET | Freq: Two times a day (BID) | ORAL | 0 refills | Status: DC

## 2018-04-02 NOTE — Patient Instructions (Addendum)

## 2018-04-02 NOTE — Progress Notes (Signed)
   Subjective:    Patient ID: Michelle [name], female    DOB: 1933/09/08, 84 y.o.   MRN: [id number]  Chief Complaint  Patient presents with  . Sinus Problem    pt here today c/o what she thinks is "sinus infection"    Sinusitis  This is a new problem. The current episode started in the past 7 days. The problem has been gradually worsening since onset. There has been no fever. Her pain is at a severity of 6/10. Associated symptoms include congestion, ear pain, headaches, a hoarse voice, sinus pressure, sneezing and a sore throat. Pertinent negatives include no chills or coughing. Treatments tried: zyrtec. The treatment provided mild relief.      Review of Systems  Constitutional: Negative for chills.  HENT: Positive for congestion, ear pain, hoarse voice, sinus pressure, sneezing and sore throat.   Respiratory: Negative for cough.   Neurological: Positive for headaches.  All other systems reviewed and are negative.      Objective:   Physical Exam  Constitutional: She is oriented to person, place, and time. She appears well-developed and well-nourished. No distress.  HENT:  Head: Normocephalic and atraumatic.  Nose: Mucosal edema present. Right sinus exhibits maxillary sinus tenderness. Left sinus exhibits maxillary sinus tenderness.  Mouth/Throat: Posterior oropharyngeal erythema present.  Bilateral ear cerumen impaction   Eyes: Pupils are equal, round, and reactive to light.  Neck: Normal range of motion. Neck supple. No thyromegaly present.  Cardiovascular: Normal rate, regular rhythm, normal heart sounds and intact distal pulses.  No murmur heard. Pulmonary/Chest: Effort normal and breath sounds normal. No respiratory distress. She has no wheezes.  Abdominal: Soft. Bowel sounds are normal. She exhibits no distension. There is no tenderness.  Musculoskeletal: Normal range of motion. She exhibits no edema or tenderness.  Neurological: She is alert and oriented to person, place,  and time. She has normal reflexes. No cranial nerve deficit.  Skin: Skin is warm and dry.  Psychiatric: She has a normal mood and affect. Her behavior is normal. Judgment and thought content normal.  Vitals reviewed.  Bilateral ears washed with warm water and peroxide, TM normal.   BP (!) 147/74   Pulse 78   Temp 97.8 F (36.6 C) (Oral)   Ht 5\' 7"  (1.702 m)   Wt 163 lb 8 oz (74.2 kg)   BMI 25.61 kg/m      Assessment & Plan:  Michelle [name] comes in today with chief complaint of Sinus Problem (pt here today c/o what she thinks is "sinus infection")   Diagnosis and orders addressed:  1. Bilateral impacted cerumen Keep clean and dry OTC drops as needed  2. Seasonal allergic rhinitis, unspecified trigger  3. Acute non-recurrent maxillary sinusitis - Take meds as prescribed - Use a cool mist humidifier  -Use saline nose sprays frequently -Force fluids -For any cough or congestion  Use plain Mucinex- regular strength or max strength is fine -For fever or aces or pains- take tylenol or ibuprofen. -Throat lozenges if help -RTO if symptoms worsen or do not improve  - amoxicillin-clavulanate (AUGMENTIN) 875-125 MG tablet; Take 1 tablet by mouth 2 (two) times daily.  Dispense: 14 tablet; Refill: 0   Evelina Dun, FNP

## 2018-04-15 ENCOUNTER — Ambulatory Visit (INDEPENDENT_AMBULATORY_CARE_PROVIDER_SITE_OTHER): Payer: Medicare Other | Admitting: *Deleted

## 2018-04-15 DIAGNOSIS — Z23 Encounter for immunization: Secondary | ICD-10-CM

## 2018-06-15 ENCOUNTER — Encounter: Payer: Self-pay | Admitting: Family Medicine

## 2018-06-15 ENCOUNTER — Ambulatory Visit (INDEPENDENT_AMBULATORY_CARE_PROVIDER_SITE_OTHER): Payer: Medicare Other | Admitting: Family Medicine

## 2018-06-15 VITALS — BP 137/77 | HR 70 | Temp 97.8°F | Ht 67.0 in | Wt 159.0 lb

## 2018-06-15 DIAGNOSIS — N3000 Acute cystitis without hematuria: Secondary | ICD-10-CM | POA: Diagnosis not present

## 2018-06-15 DIAGNOSIS — R300 Dysuria: Secondary | ICD-10-CM

## 2018-06-15 LAB — URINALYSIS, COMPLETE
BILIRUBIN UA: NEGATIVE
Glucose, UA: NEGATIVE
Nitrite, UA: POSITIVE — AB
Specific Gravity, UA: 1.02 (ref 1.005–1.030)
Urobilinogen, Ur: 1 mg/dL (ref 0.2–1.0)
pH, UA: 7 (ref 5.0–7.5)

## 2018-06-15 LAB — MICROSCOPIC EXAMINATION
Renal Epithel, UA: NONE SEEN /hpf
WBC, UA: >30 /hpf — AB (ref 0–5)

## 2018-06-15 MED ORDER — CEPHALEXIN 500 MG PO CAPS: 500.0000 mg | ORAL_CAPSULE | Freq: Two times a day (BID) | ORAL | 0 refills | Status: AC

## 2018-06-15 NOTE — Progress Notes (Signed)
Subjective: CC: UTI PCP: Chipper Herb, MD OFB:PZWCH R Gilroy is a 84 y.o. female presenting to clinic today for:  1. UTI Patient reports onset last week.  She notes increased urinary frequency with dysuria.  Denies any hematuria, fevers, chills, nausea, vomiting.  She has had some low back tightness over the last 24 hours.  She has been using extra strength Azo with last dose yesterday.  Last UTI was in May but urine culture was negative for growth.   ROS: Per HPI  Allergies  Allergen Reactions  . Nitrofurantoin   . Sulfonamide Derivatives   . Lisinopril Cough   Past Medical History:  Diagnosis Date  . Aortic stenosis    Mild echo 2014  . Diverticulosis   . Duodenitis without mention of hemorrhage   . Esophagitis, unspecified   . IBS (irritable bowel syndrome)   . Uterine cancer ([facility])     Current Outpatient Medications:  .  acetaminophen (TYLENOL) 500 MG tablet, Take 500 mg by mouth every 6 (six) hours as needed for headache., Disp: , Rfl:  .  amLODipine (NORVASC) 5 MG tablet, Take 1 tablet (5 mg total) by mouth daily., Disp: 90 tablet, Rfl: 3 .  cetirizine (ZYRTEC ALLERGY) 10 MG tablet, Take 1 tablet (10 mg total) by mouth daily. As needed, Disp: 30 tablet, Rfl: 5 .  Cholecalciferol (VITAMIN D3) 2000 UNITS TABS, Take by mouth daily.  , Disp: , Rfl:  .  docusate sodium (COLACE) 100 MG capsule, Take 100 mg by mouth. As needed, Disp: , Rfl:  .  fluticasone (FLONASE) 50 MCG/ACT nasal spray, 2 SPRAYS NASALLY ONCE A DAY AS DIRECTED, Disp: 16 g, Rfl: 2 .  hydrochlorothiazide (HYDRODIURIL) 25 MG tablet, Take 1 tablet (25 mg total) by mouth daily., Disp: 90 tablet, Rfl: 1 .  naproxen sodium (ALEVE) 220 MG tablet, Take 220 mg by mouth as needed (pain)., Disp: , Rfl:  .  pantoprazole (PROTONIX) 40 MG tablet, Take 1 tablet (40 mg total) by mouth daily., Disp: 90 tablet, Rfl: 1 .  polyethylene glycol (MIRALAX / GLYCOLAX) packet, Take 17 g by mouth daily., Disp: 14 each, Rfl: 0 .   Psyllium (METAMUCIL) 30.9 % POWD, Take by mouth. At night, Disp: , Rfl:  .  Wheat Dextrin (BENEFIBER) POWD, Take by mouth. Takes in the morning, Disp: , Rfl:  Social History   Socioeconomic History  . Marital status: Married    Spouse name: Not on file  . Number of children: 3  . Years of education: Not on file  . Highest education level: Not on file  Occupational History  . Occupation: retired  Scientific laboratory technician  . Financial resource strain: Not on file  . Food insecurity:    Worry: Not on file    Inability: Not on file  . Transportation needs:    Medical: Not on file    Non-medical: Not on file  Tobacco Use  . Smoking status: Never Smoker  . Smokeless tobacco: Never Used  Substance and Sexual Activity  . Alcohol use: No  . Drug use: No  . Sexual activity: Not on file  Lifestyle  . Physical activity:    Days per week: Not on file    Minutes per session: Not on file  . Stress: Not on file  Relationships  . Social connections:    Talks on phone: Not on file    Gets together: Not on file    Attends religious service: Not on file  Active member of club or organization: Not on file    Attends meetings of clubs or organizations: Not on file    Relationship status: Not on file  . Intimate partner violence:    Fear of current or ex partner: Not on file    Emotionally abused: Not on file    Physically abused: Not on file    Forced sexual activity: Not on file  Other Topics Concern  . Not on file  Social History Narrative   Lives with husband.     Family History  Problem Relation Age of Onset  . Heart disease Mother 22       CHF  . COPD Father   . Eczema Father   . Colon cancer Unknown        Per patient unsure of what cancer in family     Objective: Office vital signs reviewed. BP 137/77   Pulse 70   Temp 97.8 F (36.6 C) (Oral)   Ht 5\' 7"  (1.702 m)   Wt 159 lb (72.1 kg)   BMI 24.90 kg/m   Physical Examination:  General: Awake, alert, well nourished, No  acute distress GU: no suprapubic TTP. No CVA TTP  Assessment/ Plan: 84 y.o. female   1. Acute cystitis without hematuria Patient is afebrile and well-appearing with normal vital signs.  Physical exam unremarkable.  Urinalysis with 2+ leukocytes, positive nitrites and trace intact blood.  Urine microscopy with greater than 30 white blood cell and many bacteria.  This is been sent for urine culture.  I reviewed her last 2 urine cultures, May with no growth in her urine culture in 2016 with multidrug-resistant E. coli.  I have placed her on Keflex in hopes that current UTI will be responsive to this.  She unfortunately is allergic to the 2 medications that her last urinary tract infection was sensitive to.  We discussed reasons for return including worsening or not resolving infection.  She will follow-up PRN. - Urine Culture  2. Dysuria - Urinalysis, Complete - Urine Culture   Orders Placed This Encounter  Procedures  . Urine Culture  . Urinalysis, Complete   Meds ordered this encounter  Medications  . cephALEXin (KEFLEX) 500 MG capsule    Sig: Take 1 capsule (500 mg total) by mouth 2 (two) times daily for 7 days.    Dispense:  14 capsule    Refill:  [place], DO [place] [phone]

## 2018-06-15 NOTE — Patient Instructions (Signed)

## 2018-06-17 LAB — URINE CULTURE

## 2018-06-29 NOTE — Progress Notes (Signed)
HPI The patient presents for evaluation. She has some mild aortic stenosis.   She had mild to moderate stenosis on echo earlier this year.  She denies any new cardiovascular symptoms.  She does her housework.  She says she has to go up and down stairs to the basement very frequently because that is where the canned food is in the washer and dryer.  She moves slowly but the patient denies any new symptoms such as chest discomfort, neck or arm discomfort. There has been no new shortness of breath, PND or orthopnea. There have been no reported palpitations, presyncope or syncope.   Allergies  Allergen Reactions  . Nitrofurantoin     Pt does not remember this medicine  . Lisinopril Cough  . Sulfonamide Derivatives Rash    Current Outpatient Medications  Medication Sig Dispense Refill  . acetaminophen (TYLENOL) 500 MG tablet Take 500 mg by mouth every 6 (six) hours as needed for headache.    Marland Kitchen amLODipine (NORVASC) 5 MG tablet Take 1 tablet (5 mg total) by mouth daily. 90 tablet 3  . cetirizine (ZYRTEC ALLERGY) 10 MG tablet Take 1 tablet (10 mg total) by mouth daily. As needed 30 tablet 5  . Cholecalciferol (VITAMIN D3) 2000 UNITS TABS Take by mouth daily.      . fluticasone (FLONASE) 50 MCG/ACT nasal spray 2 SPRAYS NASALLY ONCE A DAY AS DIRECTED 16 g 2  . hydrochlorothiazide (HYDRODIURIL) 25 MG tablet Take 1 tablet (25 mg total) by mouth daily. 90 tablet 1  . polyethylene glycol (MIRALAX / GLYCOLAX) packet Take 17 g by mouth daily. 14 each 0  . Probiotic Product (ALIGN PO) Take by mouth daily.    . psyllium (METAMUCIL) 58.6 % powder Take 1 packet by mouth daily.    . Wheat Dextrin (BENEFIBER) POWD Take by mouth. Takes in the morning    . pantoprazole (PROTONIX) 40 MG tablet Take 1 tablet (40 mg total) by mouth daily. (Patient not taking: Reported on 07/01/2018) 90 tablet 1   No current facility-administered medications for this visit.     Past Medical History:  Diagnosis Date  .  Aortic stenosis    Mild echo 2014  . Diverticulosis   . Duodenitis without mention of hemorrhage   . Esophagitis, unspecified   . IBS (irritable bowel syndrome)   . Uterine cancer [facility])     Past Surgical History:  Procedure Laterality Date  . FOOT SURGERY     bilateral   . VAGINAL HYSTERECTOMY      ROS: Positive for dizziness and constipation.  Otherwise stated in the HPI and negative for all other systems.  PHYSICAL EXAM BP 120/60   Pulse 76   Ht 5\' 7"  (1.702 m)   Wt 160 lb (72.6 kg)   BMI 25.06 kg/m   GENERAL:  Well appearing NECK:  No jugular venous distention, waveform within normal limits, carotid upstroke brisk and symmetric, no bruits, no thyromegaly LUNGS:  Clear to auscultation bilaterally CHEST:  Unremarkable HEART:  PMI not displaced or sustained,S1 and S2 within normal limits, no S3, no S4, no clicks, no rubs, 3 out of 6 apical early to mid peaking systolic murmur radiating slightly at the right outflow tract, no diastolic murmurs ABD:  Flat, positive bowel sounds normal in frequency in pitch, no bruits, no rebound, no guarding, no midline pulsatile mass, no hepatomegaly, no splenomegaly EXT:  2 plus pulses throughout, no edema, no cyanosis no clubbing   EKG: sinus  rhythm, rate 76 , left axis deviation, poor anterior R wave progression, no acute ST-T wave changes. 07/01/2018   ASSESSMENT AND PLAN  AORTIC STENOSIS:  Her aortic stenosis was moderate earlier this year.  No change in therapy.  No further imaging until after I seen her next year.    HTN:  The blood pressure is at target.  No change in therapy.   CAROTID STENOSIS:  This was mild last year.  I will follow this clinically.

## 2018-07-01 ENCOUNTER — Ambulatory Visit (INDEPENDENT_AMBULATORY_CARE_PROVIDER_SITE_OTHER): Payer: Medicare Other | Admitting: Cardiology

## 2018-07-01 ENCOUNTER — Encounter: Payer: Self-pay | Admitting: Cardiology

## 2018-07-01 VITALS — BP 120/60 | HR 76 | Ht 67.0 in | Wt 160.0 lb

## 2018-07-01 DIAGNOSIS — I359 Nonrheumatic aortic valve disorder, unspecified: Secondary | ICD-10-CM

## 2018-07-01 DIAGNOSIS — I10 Essential (primary) hypertension: Secondary | ICD-10-CM

## 2018-07-01 NOTE — Patient Instructions (Signed)
Medication Instructions:  The current medical regimen is effective;  continue present plan and medications.  If you need a refill on your cardiac medications before your next appointment, please call your pharmacy.   Follow-Up: Follow up in 1 year with Dr. Hochrein in [place].  You will receive a letter in the mail 2 months before you are due.  Please call us when you receive this letter to schedule your follow up appointment.  Thank you for choosing [facility]!!     

## 2018-08-04 ENCOUNTER — Other Ambulatory Visit: Payer: Self-pay | Admitting: Nurse Practitioner

## 2018-08-04 DIAGNOSIS — I10 Essential (primary) hypertension: Secondary | ICD-10-CM

## 2018-08-22 ENCOUNTER — Ambulatory Visit (INDEPENDENT_AMBULATORY_CARE_PROVIDER_SITE_OTHER): Payer: Medicare Other | Admitting: Family

## 2018-08-22 ENCOUNTER — Encounter: Payer: Self-pay | Admitting: Family

## 2018-08-22 VITALS — BP 135/72 | HR 84 | Temp 97.7°F | Ht 67.0 in | Wt 156.2 lb

## 2018-08-22 DIAGNOSIS — R309 Painful micturition, unspecified: Secondary | ICD-10-CM | POA: Diagnosis not present

## 2018-08-22 DIAGNOSIS — N3000 Acute cystitis without hematuria: Secondary | ICD-10-CM

## 2018-08-22 MED ORDER — CEPHALEXIN 500 MG PO CAPS: 500.0000 mg | ORAL_CAPSULE | Freq: Two times a day (BID) | ORAL | 0 refills | Status: DC

## 2018-08-22 NOTE — Patient Instructions (Signed)

## 2018-08-22 NOTE — Progress Notes (Signed)
   Subjective:    Patient ID: Michelle [name], female    DOB: 09/11/1933, 85 y.o.   MRN: [id number]  Chief Complaint  Patient presents with  . burns with urinating  . Back Pain    Urinary Frequency   This is a new problem. The current episode started in the past 7 days. The problem occurs intermittently. The problem has been waxing and waning. The quality of the pain is described as burning. The pain is at a severity of 6/10. The pain is mild. Associated symptoms include flank pain, frequency and urgency. Pertinent negatives include no discharge, hematuria, hesitancy, nausea or vomiting. She has tried increased fluids for the symptoms. The treatment provided mild relief.      Review of Systems  Gastrointestinal: Negative for nausea and vomiting.  Genitourinary: Positive for flank pain, frequency and urgency. Negative for hematuria and hesitancy.  All other systems reviewed and are negative.      Objective:   Physical Exam Vitals signs reviewed.  Constitutional:      General: She is not in acute distress.    Appearance: She is well-developed.  HENT:     Head: Normocephalic and atraumatic.     Right Ear: Tympanic membrane normal.     Left Ear: Tympanic membrane normal.  Eyes:     Pupils: Pupils are equal, round, and reactive to light.  Neck:     Musculoskeletal: Normal range of motion and neck supple.     Thyroid: No thyromegaly.  Cardiovascular:     Rate and Rhythm: Normal rate and regular rhythm.     Heart sounds: Normal heart sounds. No murmur.  Pulmonary:     Effort: Pulmonary effort is normal. No respiratory distress.     Breath sounds: Normal breath sounds. No wheezing.  Abdominal:     General: Bowel sounds are normal. There is no distension.     Palpations: Abdomen is soft.     Tenderness: There is no abdominal tenderness. There is no right CVA tenderness or left CVA tenderness.  Musculoskeletal: Normal range of motion.        General: No tenderness.  Skin:  General: Skin is warm and dry.  Neurological:     Mental Status: She is alert and oriented to person, place, and time.     Cranial Nerves: No cranial nerve deficit.     Deep Tendon Reflexes: Reflexes are normal and symmetric.  Psychiatric:        Behavior: Behavior normal.        Thought Content: Thought content normal.        Judgment: Judgment normal.       BP 135/72   Pulse 84   Temp 97.7 F (36.5 C) (Oral)   Ht 5\' 7"  (1.702 m)   Wt 156 lb 3.2 oz (70.9 kg)   BMI 24.46 kg/m      Assessment & Plan:  Michelle [name] comes in today with chief complaint of burns with urinating and Back Pain   Diagnosis and orders addressed:  1. Pain passing urine - Urinalysis  2. Acute cystitis without hematuria Force fluids AZO over the counter X2 days RTO prn Culture pending - cephALEXin (KEFLEX) 500 MG capsule; Take 1 capsule (500 mg total) by mouth 2 (two) times daily.  Dispense: 14 capsule; Refill: 0 - Urine Culture  Evelina Dun, FNP

## 2018-08-24 LAB — URINALYSIS
Bilirubin, UA: NEGATIVE
Glucose, UA: NEGATIVE
Ketones, UA: NEGATIVE
Nitrite, UA: NEGATIVE
Protein, UA: NEGATIVE
RBC, UA: NEGATIVE
Specific Gravity, UA: 1.015 (ref 1.005–1.030)
Urobilinogen, Ur: 0.2 mg/dL (ref 0.2–1.0)
pH, UA: 8 — ABNORMAL HIGH (ref 5.0–7.5)

## 2018-09-12 ENCOUNTER — Other Ambulatory Visit: Payer: Self-pay | Admitting: Nurse Practitioner

## 2018-09-12 DIAGNOSIS — I10 Essential (primary) hypertension: Secondary | ICD-10-CM

## 2018-11-07 ENCOUNTER — Other Ambulatory Visit: Payer: Self-pay | Admitting: Cardiology

## 2018-11-09 NOTE — Telephone Encounter (Signed)
Amlodipine 5 mg refilled. 

## 2018-12-03 ENCOUNTER — Other Ambulatory Visit: Payer: Self-pay

## 2018-12-03 ENCOUNTER — Ambulatory Visit (INDEPENDENT_AMBULATORY_CARE_PROVIDER_SITE_OTHER): Payer: Medicare Other | Admitting: *Deleted

## 2018-12-03 VITALS — Ht 67.0 in | Wt 156.0 lb

## 2018-12-03 DIAGNOSIS — Z0000 Encounter for general adult medical examination without abnormal findings: Secondary | ICD-10-CM

## 2018-12-03 NOTE — Progress Notes (Signed)
MEDICARE ANNUAL WELLNESS VISIT  12/03/2018  Telephone Visit Disclaimer This Medicare AWV was conducted by telephone due to national recommendations for restrictions regarding the COVID-19 Pandemic (e.g. social distancing).  I verified, using two identifiers, that I am speaking with Michelle [name] or their authorized healthcare agent. I discussed the limitations, risks, security, and privacy concerns of performing an evaluation and management service by telephone and the potential availability of an in-person appointment in the future. The patient expressed understanding and agreed to proceed.   Subjective:  Michelle [name] is a 85 y.o. female patient of Dettinger, Fransisca Kaufmann, MD who had a Medicare Annual Wellness Visit today via telephone. Michelle [name] is Retired and lives with their spouse. she has 3 children. she reports that she is socially active and does interact with friends/family regularly. she is minimally physically active and enjoys gardening.  Patient Care Team: Dettinger, Fransisca Kaufmann, MD as PCP - General (Family Medicine)  Advanced Directives 12/03/2018  Does Patient Have a Medical Advance Directive? Yes  Type of Paramedic of [place];Living [name]  Does patient want to make changes to medical advance directive? No - Patient declined  Copy of [place] in Chart? No - copy requested    Hospital Utilization Over the Past 12 Months: # of hospitalizations or ER visits: 0 # of surgeries: 0  Review of Systems    Patient reports that her overall health is unchanged compared to last year.  Patient Reported Readings (BP, Pulse, CBG, Weight, etc) none  Review of Systems: History obtained from chart review and the patient General ROS: negative  All other systems negative.  Pain Assessment Pain : No/denies pain     Current Medications & Allergies (verified) Allergies as of 12/03/2018      Reactions   Nitrofurantoin    Pt does not  remember this medicine   Lisinopril Cough   Sulfonamide Derivatives Rash      Medication List       Accurate as of Dec 03, 2018  9:54 AM. If you have any questions, ask your nurse or doctor.        STOP taking these medications   cephALEXin 500 MG capsule Commonly known as:  KEFLEX     TAKE these medications   acetaminophen 500 MG tablet Commonly known as:  TYLENOL Take 500 mg by mouth every 6 (six) hours as needed for headache.   ALIGN PO Take by mouth daily.   amLODipine 5 MG tablet Commonly known as:  NORVASC TAKE 1 TABLET DAILY   Benefiber Powd Take by mouth. Takes in the morning   cetirizine 10 MG tablet Commonly known as:  ZyrTEC Allergy Take 1 tablet (10 mg total) by mouth daily. As needed   fluticasone 50 MCG/ACT nasal spray Commonly known as:  FLONASE 2 SPRAYS NASALLY ONCE A DAY AS DIRECTED   hydrochlorothiazide 25 MG tablet Commonly known as:  HYDRODIURIL TAKE 1 TABLET DAILY. NEED APPOINTMENT   pantoprazole 40 MG tablet Commonly known as:  PROTONIX Take 1 tablet (40 mg total) by mouth daily.   polyethylene glycol 17 g packet Commonly known as:  MIRALAX / GLYCOLAX Take 17 g by mouth daily.   psyllium 58.6 % powder Commonly known as:  METAMUCIL Take 1 packet by mouth daily.   Vitamin D3 50 MCG (2000 UT) Tabs Take by mouth daily.       History (reviewed): Past Medical History:  Diagnosis Date  . Aortic stenosis  Mild echo 2014  . Diverticulosis   . Duodenitis without mention of hemorrhage   . Esophagitis, unspecified   . IBS (irritable bowel syndrome)   . Uterine cancer [facility])    Past Surgical History:  Procedure Laterality Date  . FOOT SURGERY     bilateral   . VAGINAL HYSTERECTOMY     Family History  Problem Relation Age of Onset  . Heart disease Mother 83       CHF  . COPD Father   . Eczema Father   . Colon cancer Other        Per patient unsure of what cancer in family    Social History   Socioeconomic History  .  Marital status: Married    Spouse name: [name]  . Number of children: 3  . Years of education: Not on file  . Highest education level: 9th grade  Occupational History  . Occupation: retired  Scientific laboratory technician  . Financial resource strain: Not hard at all  . Food insecurity:    Worry: Never true    Inability: Never true  . Transportation needs:    Medical: No    Non-medical: No  Tobacco Use  . Smoking status: Never Smoker  . Smokeless tobacco: Never Used  Substance and Sexual Activity  . Alcohol use: No  . Drug use: No  . Sexual activity: Not Currently  Lifestyle  . Physical activity:    Days per week: 0 days    Minutes per session: 0 min  . Stress: Not at all  Relationships  . Social connections:    Talks on phone: More than three times a week    Gets together: More than three times a week    Attends religious service: More than 4 times per year    Active member of club or organization: No    Attends meetings of clubs or organizations: Never    Relationship status: Married  Other Topics Concern  . Not on file  Social History Narrative   Lives with husband.      Activities of Daily Living In your present state of health, do you have any difficulty performing the following activities: 12/03/2018  Hearing? N  Vision? N  Difficulty concentrating or making decisions? N  Walking or climbing stairs? N  Dressing or bathing? N  Doing errands, shopping? N  Preparing Food and eating ? N  Using the Toilet? N  In the past six months, have you accidently leaked urine? N  Do you have problems with loss of bowel control? N  Managing your Medications? N  Managing your Finances? N  Housekeeping or managing your Housekeeping? N  Some recent data might be hidden    Patient Literacy How often do you need to have someone help you when you read instructions, pamphlets, or other written materials from your doctor or pharmacy?: 1 - Never  Exercise Current Exercise Habits: The patient  does not participate in regular exercise at present, Exercise limited by: None identified  Diet Patient reports consuming 3 meals a day and 2 snack(s) a day Patient reports that her primary diet is: Regular Patient reports that she does have regular access to food.   Depression Screen PHQ 2/9 Scores 12/03/2018 06/15/2018 11/18/2017 08/22/2017 11/27/2016 04/29/2016 12/01/2014  PHQ - 2 Score 0 0 0 0 0 0 0  PHQ- 9 Score - 0 - - - - -     Fall Risk Fall Risk  12/03/2018 06/15/2018 11/18/2017 08/22/2017 11/27/2016  Falls in the past year? 0 0 No No No     Objective:  Michelle [name] seemed alert and oriented and she participated appropriately during our telephone visit.  Blood Pressure Weight BMI  BP Readings from Last 3 Encounters:  08/22/18 135/72  07/01/18 120/60  06/15/18 137/77   Wt Readings from Last 3 Encounters:  12/03/18 156 lb (70.8 kg)  08/22/18 156 lb 3.2 oz (70.9 kg)  07/01/18 160 lb (72.6 kg)   BMI Readings from Last 1 Encounters:  12/03/18 24.43 kg/m    *Unable to obtain current vital signs, weight, and BMI due to telephone visit type  Hearing/Vision  . [place] did not seem to have difficulty with hearing/understanding during the telephone conversation . Reports that she has had a formal eye exam by an eye care professional within the past year . Reports that she has not had a formal hearing evaluation within the past year *Unable to fully assess hearing and vision during telephone visit type  Cognitive Function: 6CIT Screen 12/03/2018  What Year? 0 points  What month? 0 points  What time? 0 points  Count back from 20 0 points  Months in reverse 0 points  Repeat phrase 0 points  Total Score 0    Normal Cognitive Function Screening: Yes (Normal:0-7, Significant for Dysfunction: >8)  Immunization & Health Maintenance Record Immunization History  Administered Date(s) Administered  . Influenza, High Dose Seasonal PF 04/19/2016, 04/30/2017, 04/15/2018  .  Influenza,inj,Quad PF,6+ Mos 04/26/2013, 06/02/2014, 04/21/2015  . Pneumococcal Conjugate-13 12/01/2014  . Pneumococcal Polysaccharide-23 04/29/2016  . Tdap 10/15/2011    Health Maintenance  Topic Date Due  . INFLUENZA VACCINE  02/13/2019  . TETANUS/TDAP  10/14/2021  . DEXA SCAN  Completed  . PNA vac Low Risk Adult  Completed  . MAMMOGRAM  Discontinued       Assessment  This is a routine wellness examination for Michelle [name].  Health Maintenance: Due or Overdue There are no preventive care reminders to display for this patient.  Michelle [name] does not need a referral for Community Assistance: Care Management:   no Social Work:    no Prescription Assistance:  no Nutrition/Diabetes Education:  no   Plan:  Personalized Goals Goals Addressed            This Visit's Progress   . Exercise 3x per week (30 min per time)        Personalized Health Maintenance & Screening Recommendations  Up to date  Lung Cancer Screening Recommended: no (Low Dose CT Chest recommended if Age 13-80 years, 30 pack-year currently smoking OR have quit w/in past 15 years) Hepatitis C Screening recommended: no HIV Screening recommended: no  Advanced Directives: Written information was not prepared per patient's request.  Referrals & Orders No orders of the defined types were placed in this encounter.   Follow-up Plan . Follow-up with Dettinger, Fransisca Kaufmann, MD as planned   I have personally reviewed and noted the following in the patient's chart:   . Medical and social history . Use of alcohol, tobacco or illicit drugs  . Current medications and supplements . Functional ability and status . Nutritional status . Physical activity . Advanced directives . List of other physicians . Hospitalizations, surgeries, and ER visits in previous 12 months . Vitals . Screenings to include cognitive, depression, and falls . Referrals and appointments  In addition, I have reviewed and discussed  with Michelle [name] certain preventive protocols, quality metrics, and best  practice recommendations. A written personalized care plan for preventive services as well as general preventive health recommendations is available and can be mailed to the patient at her request.      Truett Mainland, LPN  signature  [id number]

## 2018-12-03 NOTE — Patient Instructions (Signed)
Michelle [name] , Thank you for taking time to come for your Medicare Wellness Visit. I appreciate your ongoing commitment to your health goals. Please review the following plan we discussed and let me know if I can assist you in the future.   These are the goals we discussed: Goals    . Exercise 3x per week (30 min per time)       This is a list of the screening recommended for you and due dates:  Health Maintenance  Topic Date Due  . Flu Shot  02/13/2019  . Tetanus Vaccine  10/14/2021  . DEXA scan (bone density measurement)  Completed  . Pneumonia vaccines  Completed  . Mammogram  Discontinued     Exercise for Older Adults Staying physically active is important as you age. The four types of exercises that are best for older adults are endurance, strength, balance, and flexibility. Contact your health care provider before you start any exercise routine. Ask your health care provider what activities are safe for you. What are the risks? Risks associated with exercising include:  Overdoing it. This may lead to sore muscles or fatigue.  Falls.  Injuries.  Dehydration. How to do these exercises Endurance exercises Endurance (aerobic) exercises raise your breathing rate and heart rate. Increasing your endurance helps you to do everyday tasks and stay healthy. By improving the health of your body system that includes your heart, lungs, and blood vessels (circulatory system), you may also delay or prevent diseases such as heart disease, diabetes, and bone loss (osteoporosis). Types of endurance exercises include:  Sports.  Indoor activities, such as using gym equipment, doing water aerobics, or dancing.  Outdoor activities, such as biking or jogging.  Tasks around the house, such as gardening, yard work, and heavy household chores like cleaning.  Walking, such as hiking or walking around your neighborhood. When doing endurance exercises, make sure you:  Are aware of your  surroundings.  Use safety equipment as directed.  Dress in layers when exercising outdoors.  Drink plenty of water to stay well hydrated. Build up endurance slowly. Start with 10 minutes at a time, and gradually build up to doing 30 minutes at a time. Unless your health care provider gave you different instructions, aim to exercise for a total of 150 minutes a week. Spread out that time so you are working on endurance on 3 or more days a week. Strength exercises Lifting, pulling, or pushing weights helps to strengthen muscles. Having stronger muscles makes it easier to do everyday activities, such as getting up from a chair, climbing stairs, carrying groceries, and playing with grandchildren. Strength exercises include arm and leg exercises that may be done:  With weights.  Without weights (using your own body weight).  With a resistance band. When doing strength exercises:  Move smoothly and steadily. Do not suddenly thrust or jerk the weights, the resistance band, or your body.  Start with no weights or with light weights, and gradually add more weight over time. Eventually, aim to use weights that are hard or very hard for you to lift. This means that you are able to do 8 repetitions with the weight, and the last few repetitions are very challenging.  Lift or push weights into position for 3 seconds, hold the position for 1 second, and then take 3 seconds to return to your starting position.  Breathe out (exhale) during difficult movements, like lifting or pushing weights. Breathe in (inhale) to relax your muscles before  the next repetition.  Consider alternating arms or legs, especially when you first start strength exercises.  Expect some slight muscle soreness after each session. Do strength exercises on 2 or more days a week, for 30 minutes at a time. Avoid exercising the same muscle groups two days in a row. For example, if you work on your leg muscles one day, work on your arm  muscles the next day. When you can do two sets of 10-15 repetitions with a certain weight, increase the amount of weight. Balance Balance exercises can help to prevent falls. Balance exercises include:  Standing on one foot.  Heel-to-toe walk.  Balance walk.  Tai chi. Make sure you have something sturdy to hold onto while doing balance exercises, such as a sturdy chair. As your balance improves, challenge yourself by holding onto the chair with one hand instead of two, and then with no hands. Trying exercises with your eyes closed also challenges your balance, but be sure to have a sturdy surface (like a countertop) close by in case you need it. Do balance exercises as often as you want, or as often as directed by your health care provider. Strength exercises for the lower body also help to improve balance. Flexibility Flexibility exercises improve how far you can bend, straighten, move, or rotate parts of your body (range of motion). These exercises also help you to do everyday activities such as getting dressed or reaching for objects. Flexibility exercises include stretching different parts of the body, and they may be done in a standing or seated position or on the floor. When stretching, make sure you:  Keep a slight bend in your arms and legs. Avoid completely straightening ("locking") your joints.  Do not stretch so far that you feel pain. You should feel a mild stretching feeling. You may try stretching farther as you become more flexible over time.  Relax and breathe between stretches.  Hold onto something sturdy for balance as needed. Hold each stretch for 10-30 seconds. Repeat each stretch 3-5 times. General safety tips  Exercise in well-lit areas.  Do not hold your breath during exercises or stretches.  Warm up before exercising, and cool down after exercising. This can help prevent injury.  Drink plenty of water during exercise or any activity that makes you sweat.   Use smooth, steady movements. Do not use sudden, jerking movements, especially when lifting weights or doing flexibility exercises.  If you are not sure if an exercise is safe for you, or you are not sure how to do an exercise, talk with your health care provider. This is especially important if you have had surgery on muscles, bones, or joints (orthopedic surgery). Where to find more information You can find more information about exercise for older adults from:  Your local health department, fitness center, or community center. These facilities may have programs for aging adults.  [employer] on Aging: http://kim-miller.com/  National Council on Aging: www.ncoa.org Summary  Staying physically active is important as you age.  Make sure to contact your health care provider before you start any exercise routine. Ask your health care provider what activities are safe for you.  Doing endurance, strength, balance, and flexibility exercises can help to delay or prevent certain diseases, such as heart disease, diabetes, and bone loss (osteoporosis). This information is not intended to replace advice given to you by your health care provider. Make sure you discuss any questions you have with your health care provider. Document Released: 11/20/2016  Document Revised: 11/20/2016 Document Reviewed: 11/20/2016 Elsevier Interactive Patient Education  [employer].

## 2018-12-10 ENCOUNTER — Other Ambulatory Visit: Payer: Self-pay

## 2018-12-11 ENCOUNTER — Ambulatory Visit (INDEPENDENT_AMBULATORY_CARE_PROVIDER_SITE_OTHER): Payer: Medicare Other | Admitting: Family Medicine

## 2018-12-11 ENCOUNTER — Other Ambulatory Visit: Payer: Self-pay

## 2018-12-11 ENCOUNTER — Encounter: Payer: Self-pay | Admitting: Family Medicine

## 2018-12-11 VITALS — BP 144/76 | HR 80 | Temp 97.1°F | Ht 67.0 in | Wt 156.8 lb

## 2018-12-11 DIAGNOSIS — E559 Vitamin D deficiency, unspecified: Secondary | ICD-10-CM

## 2018-12-11 DIAGNOSIS — Z13220 Encounter for screening for lipoid disorders: Secondary | ICD-10-CM | POA: Diagnosis not present

## 2018-12-11 DIAGNOSIS — J302 Other seasonal allergic rhinitis: Secondary | ICD-10-CM | POA: Diagnosis not present

## 2018-12-11 DIAGNOSIS — Z6827 Body mass index (BMI) 27.0-27.9, adult: Secondary | ICD-10-CM

## 2018-12-11 DIAGNOSIS — I10 Essential (primary) hypertension: Secondary | ICD-10-CM

## 2018-12-11 MED ORDER — FLUTICASONE PROPIONATE 50 MCG/ACT NA SUSP: 1.0000 | Freq: Two times a day (BID) | NASAL | 2 refills | Status: DC | PRN

## 2018-12-11 MED ORDER — HYDROCHLOROTHIAZIDE 25 MG PO TABS: ORAL_TABLET | ORAL | 3 refills | Status: DC

## 2018-12-11 NOTE — Progress Notes (Signed)
BP (!) 144/76   Pulse 80   Temp (!) 97.1 F (36.2 C) (Oral)   Ht _0  (1.702 m)   Wt 156 lb 12.8 oz (71.1 kg)   BMI 24.56 kg/m    Subjective:   Patient ID: Michelle [name], female    DOB: [birth year], 85 y.o.   MRN: [id number]  HPI: FAJR FIFE is a 85 y.o. female presenting on 12/11/2018 for Establish Care ([facility] patient . Patient states that her ears have been feeling like they are stopped up and also states that they have been sore in the inside of her ears.); Medical Management of Chronic Issues; and Dizziness (Patient states it is on and off dizzy spells.)   HPI Hypertension Patient is currently on amlodipine and hctz, and their blood pressure today is 144/76. Patient denies any lightheadedness or dizziness. Patient denies headaches, blurred vision, chest pains, shortness of breath, or weakness. Denies any side effects from medication and is content with current medication.   Chronic sinus allergies Patient is a refill of medication for chronic sinus allergies, she has little ear congestion today but otherwise doing okay.  She does get some dizziness off and on chronically associated with her sinuses and congestion and chronic allergies.  Vitamin D deficiency and osteopenia Patient is coming in for recheck today for vitamin D deficiency and osteopenia and she has been taking vitamin D consistently and is just coming in for recheck today  Relevant past medical, surgical, family and social history reviewed and updated as indicated. Interim medical history since our last visit reviewed. Allergies and medications reviewed and updated.  Review of Systems  Constitutional: Negative for chills and fever.  HENT: Positive for congestion. Negative for ear discharge and ear pain.   Eyes: Negative for redness and visual disturbance.  Respiratory: Negative for cough, chest tightness, shortness of breath and wheezing.   Cardiovascular: Negative for chest pain and leg swelling.   Genitourinary: Negative for difficulty urinating and dysuria.  Musculoskeletal: Negative for back pain and gait problem.  Skin: Negative for rash.  Neurological: Negative for light-headedness and headaches.  Psychiatric/Behavioral: Negative for agitation and behavioral problems.  All other systems reviewed and are negative.   Per HPI unless specifically indicated above   Allergies as of 12/11/2018      Reactions   Nitrofurantoin    Pt does not remember this medicine   Lisinopril Cough   Sulfonamide Derivatives Rash      Medication List       Accurate as of Dec 11, 2018 11:30 AM. If you have any questions, ask your nurse or doctor.        STOP taking these medications   pantoprazole 40 MG tablet Commonly known as:  PROTONIX Stopped by:  Fransisca Kaufmann Emeli Goguen, MD     TAKE these medications   acetaminophen 500 MG tablet Commonly known as:  TYLENOL Take 500 mg by mouth every 6 (six) hours as needed for headache.   ALIGN PO Take by mouth daily.   amLODipine 5 MG tablet Commonly known as:  NORVASC TAKE 1 TABLET DAILY   Benefiber Powd Take by mouth. Takes in the morning   cetirizine 10 MG tablet Commonly known as:  ZyrTEC Allergy Take 1 tablet (10 mg total) by mouth daily. As needed   fluticasone 50 MCG/ACT nasal spray Commonly known as:  FLONASE Place 1 spray into both nostrils 2 (two) times daily as needed for allergies or rhinitis. What changed:  See the  new instructions. Changed by:  Fransisca Kaufmann Trysten Berti, MD   hydrochlorothiazide 25 MG tablet Commonly known as:  HYDRODIURIL TAKE 1 TABLET DAILY. What changed:  See the new instructions. Changed by:  Fransisca Kaufmann Antonious Omahoney, MD   polyethylene glycol 17 g packet Commonly known as:  MIRALAX / GLYCOLAX Take 17 g by mouth daily.   psyllium 58.6 % powder Commonly known as:  METAMUCIL Take 1 packet by mouth daily.   Vitamin D3 50 MCG (2000 UT) Tabs Take by mouth daily.        Objective:   BP (!) 144/76    Pulse 80   Temp (!) 97.1 F (36.2 C) (Oral)   Ht _0  (1.702 m)   Wt 156 lb 12.8 oz (71.1 kg)   BMI 24.56 kg/m   Wt Readings from Last 3 Encounters:  12/11/18 156 lb 12.8 oz (71.1 kg)  12/03/18 156 lb (70.8 kg)  08/22/18 156 lb 3.2 oz (70.9 kg)    Physical Exam Vitals signs and nursing note reviewed.  Constitutional:      General: She is not in acute distress.    Appearance: She is well-developed. She is not diaphoretic.  HENT:     Right Ear: Tympanic membrane normal.     Left Ear: Tympanic membrane normal.  Eyes:     Conjunctiva/sclera: Conjunctivae normal.  Cardiovascular:     Rate and Rhythm: Normal rate and regular rhythm.     Heart sounds: Normal heart sounds. No murmur.  Pulmonary:     Effort: Pulmonary effort is normal. No respiratory distress.     Breath sounds: Normal breath sounds. No wheezing.  Musculoskeletal: Normal range of motion.        General: No tenderness.  Skin:    General: Skin is warm and dry.     Findings: No rash.  Neurological:     Mental Status: She is alert and oriented to person, place, and time.     Coordination: Coordination normal.  Psychiatric:        Behavior: Behavior normal.      Assessment & Plan:   Problem List Items Addressed This Visit      Cardiovascular and Mediastinum   Hypertension - Primary   Relevant Medications   hydrochlorothiazide (HYDRODIURIL) 25 MG tablet   Other Relevant Orders   CBC with Differential/Platelet (Completed)   CMP14+EGFR (Completed)     Respiratory   Seasonal allergic rhinitis   Relevant Medications   fluticasone (FLONASE) 50 MCG/ACT nasal spray     Other   Vitamin D deficiency   Relevant Orders   VITAMIN D 25 Hydroxy (Vit-D Deficiency, Fractures) (Completed)   BMI 27.0-27.9,adult    Other Visit Diagnoses    Lipid screening       Relevant Orders   Lipid panel (Completed)      Continue current medication, use Flonase and will refill for the for the ear congestion and chronic  allergies. Follow up plan: Return in about 1 year (around 12/11/2019), or if symptoms worsen or fail to improve, for Blood pressure recheck.  Counseling provided for all of the vaccine components Orders Placed This Encounter  Procedures  . CBC with Differential/Platelet  . CMP14+EGFR  . Lipid panel  . VITAMIN D 25 Hydroxy (Vit-D Deficiency, Fractures)    Caryl Pina, MD [facility] 12/11/2018, 11:30 AM

## 2018-12-12 LAB — LIPID PANEL
Chol/HDL Ratio: 3.1 ratio (ref 0.0–4.4)
Cholesterol, Total: 254 mg/dL — ABNORMAL HIGH (ref 100–199)
HDL: 82 mg/dL (ref >39)
LDL Calculated: 161 mg/dL — ABNORMAL HIGH (ref 0–99)
Triglycerides: 53 mg/dL (ref 0–149)
VLDL Cholesterol Cal: 11 mg/dL (ref 5–40)

## 2018-12-12 LAB — CMP14+EGFR
ALT: 16 IU/L (ref 0–32)
AST: 27 IU/L (ref 0–40)
Albumin/Globulin Ratio: 2 (ref 1.2–2.2)
Albumin: 4.2 g/dL (ref 3.6–4.6)
Alkaline Phosphatase: 59 IU/L (ref 39–117)
BUN/Creatinine Ratio: 20 (ref 12–28)
BUN: 16 mg/dL (ref 8–27)
Bilirubin Total: 0.7 mg/dL (ref 0.0–1.2)
CO2: 26 mmol/L (ref 20–29)
Calcium: 9.5 mg/dL (ref 8.7–10.3)
Chloride: 100 mmol/L (ref 96–106)
Creatinine, Ser: 0.81 mg/dL (ref 0.57–1.00)
GFR calc Af Amer: 78 mL/min/{1.73_m2} (ref >59)
GFR calc non Af Amer: 68 mL/min/{1.73_m2} (ref >59)
Globulin, Total: 2.1 g/dL (ref 1.5–4.5)
Glucose: 100 mg/dL — ABNORMAL HIGH (ref 65–99)
Potassium: 3.3 mmol/L — ABNORMAL LOW (ref 3.5–5.2)
Sodium: 143 mmol/L (ref 134–144)
Total Protein: 6.3 g/dL (ref 6.0–8.5)

## 2018-12-12 LAB — CBC WITH DIFFERENTIAL/PLATELET
Basophils Absolute: 0 10*3/uL (ref 0.0–0.2)
Basos: 1 %
EOS (ABSOLUTE): 0.1 10*3/uL (ref 0.0–0.4)
Eos: 1 %
Hematocrit: 35.4 % (ref 34.0–46.6)
Hemoglobin: 12.7 g/dL (ref 11.1–15.9)
Immature Grans (Abs): 0 10*3/uL (ref 0.0–0.1)
Immature Granulocytes: 0 %
Lymphocytes Absolute: 0.6 10*3/uL — ABNORMAL LOW (ref 0.7–3.1)
Lymphs: 17 %
MCH: 32.7 pg (ref 26.6–33.0)
MCHC: 35.9 g/dL — ABNORMAL HIGH (ref 31.5–35.7)
MCV: 91 fL (ref 79–97)
Monocytes Absolute: 0.3 10*3/uL (ref 0.1–0.9)
Monocytes: 9 %
Neutrophils Absolute: 2.8 10*3/uL (ref 1.4–7.0)
Neutrophils: 72 %
Platelets: 178 10*3/uL (ref 150–450)
RBC: 3.88 x10E6/uL (ref 3.77–5.28)
RDW: 13.1 % (ref 11.7–15.4)
WBC: 3.8 10*3/uL (ref 3.4–10.8)

## 2018-12-12 LAB — VITAMIN D 25 HYDROXY (VIT D DEFICIENCY, FRACTURES): Vit D, 25-Hydroxy: 46.9 ng/mL (ref 30.0–100.0)

## 2018-12-21 ENCOUNTER — Other Ambulatory Visit: Payer: Self-pay | Admitting: *Deleted

## 2018-12-21 ENCOUNTER — Telehealth: Payer: Self-pay | Admitting: Family Medicine

## 2018-12-21 ENCOUNTER — Encounter: Payer: Self-pay | Admitting: *Deleted

## 2018-12-21 MED ORDER — ATORVASTATIN CALCIUM 10 MG PO TABS: 10.0000 mg | ORAL_TABLET | Freq: Every day | ORAL | 3 refills | Status: DC

## 2018-12-21 NOTE — Telephone Encounter (Signed)
Aware of results and recommendations  

## 2018-12-21 NOTE — Telephone Encounter (Signed)
RTN call about labs

## 2018-12-31 ENCOUNTER — Telehealth: Payer: Self-pay | Admitting: Family Medicine

## 2018-12-31 MED ORDER — CEPHALEXIN 500 MG PO CAPS: 500.0000 mg | ORAL_CAPSULE | Freq: Four times a day (QID) | ORAL | 0 refills | Status: DC

## 2018-12-31 NOTE — Telephone Encounter (Signed)
Patient aware.

## 2018-12-31 NOTE — Telephone Encounter (Signed)
I sent Keflex for the patient for UTI symptoms

## 2018-12-31 NOTE — Telephone Encounter (Signed)
What symptoms do you have? Burning with urination and frequent urination  How long have you been sick? About a week or more  Have you been seen for this problem? no  If your provider decides to give you a prescription, which pharmacy would you like for it to be sent to? Wants antibiotic sent to [facility]   Patient informed that this information will be sent to the clinical staff for review and that they should receive a follow up call.

## 2019-01-13 ENCOUNTER — Telehealth: Payer: Self-pay | Admitting: Family Medicine

## 2019-01-14 NOTE — Telephone Encounter (Signed)
Per lab result- labs were mailed.

## 2019-04-09 ENCOUNTER — Other Ambulatory Visit: Payer: Self-pay

## 2019-04-09 ENCOUNTER — Ambulatory Visit (INDEPENDENT_AMBULATORY_CARE_PROVIDER_SITE_OTHER): Payer: Medicare Other

## 2019-04-09 DIAGNOSIS — Z23 Encounter for immunization: Secondary | ICD-10-CM

## 2019-04-19 ENCOUNTER — Ambulatory Visit (INDEPENDENT_AMBULATORY_CARE_PROVIDER_SITE_OTHER): Payer: Medicare Other | Admitting: Family Medicine

## 2019-04-19 DIAGNOSIS — R3915 Urgency of urination: Secondary | ICD-10-CM | POA: Diagnosis not present

## 2019-04-19 DIAGNOSIS — R350 Frequency of micturition: Secondary | ICD-10-CM

## 2019-04-19 DIAGNOSIS — R300 Dysuria: Secondary | ICD-10-CM

## 2019-04-19 MED ORDER — CIPROFLOXACIN HCL 500 MG PO TABS: 500.0000 mg | ORAL_TABLET | Freq: Two times a day (BID) | ORAL | 0 refills | Status: AC

## 2019-04-19 NOTE — Progress Notes (Signed)
Virtual Visit via telephone Note Due to COVID-19 pandemic this visit was conducted virtually. This visit type was conducted due to national recommendations for restrictions regarding the COVID-19 Pandemic (e.g. social distancing, sheltering in place) in an effort to limit this patient's exposure and mitigate transmission in our community. All issues noted in this document were discussed and addressed.  A physical exam was not performed with this format.   I connected with Michelle [name] on 04/19/19 at 1635 by telephone and verified that I am speaking with the correct person using two identifiers. Michelle [name] is currently located at home and family is currently with them during visit. The provider, Monia Pouch, FNP is located in their office at time of visit.  I discussed the limitations, risks, security and privacy concerns of performing an evaluation and management service by telephone and the availability of in person appointments. I also discussed with the patient that there may be a patient responsible charge related to this service. The patient expressed understanding and agreed to proceed.  Subjective:  Patient ID: Michelle [name], female    DOB: 07-11-1934, 85 y.o.   MRN: [id number]  Chief Complaint:  Urinary Tract Infection   HPI: Michelle [name] is a 85 y.o. female presenting on 04/19/2019 for Urinary Tract Infection   Pt reports three days of dysuria, urgency, and frequency.   Urinary Tract Infection  This is a new problem. The current episode started in the past 7 days. The problem occurs every urination. The problem has been gradually worsening. The quality of the pain is described as aching and burning. The pain is at a severity of 3/10. The pain is mild. There has been no fever. She is not sexually active. There is no history of pyelonephritis. Associated symptoms include frequency and urgency. Pertinent negatives include no chills, discharge, flank pain, hematuria, hesitancy,  nausea, possible pregnancy, sweats or vomiting. She has tried increased fluids (cranberry AZO) for the symptoms. The treatment provided no relief.     Relevant past medical, surgical, family, and social history reviewed and updated as indicated.  Allergies and medications reviewed and updated.   Past Medical History:  Diagnosis Date  . Aortic stenosis    Mild echo 2014  . Diverticulosis   . Duodenitis without mention of hemorrhage   . Esophagitis, unspecified   . IBS (irritable bowel syndrome)   . Uterine cancer [facility])     Past Surgical History:  Procedure Laterality Date  . FOOT SURGERY     bilateral   . VAGINAL HYSTERECTOMY      Social History   Socioeconomic History  . Marital status: Married    Spouse name: [name]  . Number of children: 3  . Years of education: Not on file  . Highest education level: 9th grade  Occupational History  . Occupation: retired  Scientific laboratory technician  . Financial resource strain: Not hard at all  . Food insecurity    Worry: Never true    Inability: Never true  . Transportation needs    Medical: No    Non-medical: No  Tobacco Use  . Smoking status: Never Smoker  . Smokeless tobacco: Never Used  Substance and Sexual Activity  . Alcohol use: No  . Drug use: No  . Sexual activity: Not Currently  Lifestyle  . Physical activity    Days per week: 0 days    Minutes per session: 0 min  . Stress: Not at all  Relationships  . Social  connections    Talks on phone: More than three times a week    Gets together: More than three times a week    Attends religious service: More than 4 times per year    Active member of club or organization: No    Attends meetings of clubs or organizations: Never    Relationship status: Married  . Intimate partner violence    Fear of current or ex partner: No    Emotionally abused: No    Physically abused: No    Forced sexual activity: No  Other Topics Concern  . Not on file  Social History Narrative   Lives  with husband.      Outpatient Encounter Medications as of 04/19/2019  Medication Sig  . acetaminophen (TYLENOL) 500 MG tablet Take 500 mg by mouth every 6 (six) hours as needed for headache.  Marland Kitchen amLODipine (NORVASC) 5 MG tablet TAKE 1 TABLET DAILY  . atorvastatin (LIPITOR) 10 MG tablet Take 1 tablet (10 mg total) by mouth daily.  . cephALEXin (KEFLEX) 500 MG capsule Take 1 capsule (500 mg total) by mouth 4 (four) times daily.  . cetirizine (ZYRTEC ALLERGY) 10 MG tablet Take 1 tablet (10 mg total) by mouth daily. As needed  . Cholecalciferol (VITAMIN D3) 2000 UNITS TABS Take by mouth daily.    . ciprofloxacin (CIPRO) 500 MG tablet Take 1 tablet (500 mg total) by mouth 2 (two) times daily for 5 days.  . fluticasone (FLONASE) 50 MCG/ACT nasal spray Place 1 spray into both nostrils 2 (two) times daily as needed for allergies or rhinitis.  . hydrochlorothiazide (HYDRODIURIL) 25 MG tablet TAKE 1 TABLET DAILY.  Marland Kitchen polyethylene glycol (MIRALAX / GLYCOLAX) packet Take 17 g by mouth daily.  . Probiotic Product (ALIGN PO) Take by mouth daily.  . psyllium (METAMUCIL) 58.6 % powder Take 1 packet by mouth daily.  . Wheat Dextrin (BENEFIBER) POWD Take by mouth. Takes in the morning   No facility-administered encounter medications on file as of 04/19/2019.     Allergies  Allergen Reactions  . Nitrofurantoin     Pt does not remember this medicine  . Lisinopril Cough  . Sulfonamide Derivatives Rash    Review of Systems  Constitutional: Negative for activity change, appetite change, chills, diaphoresis, fatigue, fever and unexpected weight change.  HENT: Negative.   Eyes: Negative.   Respiratory: Negative for cough, chest tightness and shortness of breath.   Cardiovascular: Negative for chest pain, palpitations and leg swelling.  Gastrointestinal: Negative for abdominal pain, blood in stool, constipation, diarrhea, nausea and vomiting.  Endocrine: Negative.  Negative for polydipsia, polyphagia and  polyuria.  Genitourinary: Positive for frequency and urgency. Negative for decreased urine volume, difficulty urinating, dysuria, flank pain, hematuria and hesitancy.  Musculoskeletal: Negative for arthralgias, back pain and myalgias.  Skin: Negative.  Negative for color change and pallor.  Allergic/Immunologic: Negative.   Neurological: Negative for dizziness, syncope, weakness, light-headedness and headaches.  Hematological: Negative.   Psychiatric/Behavioral: Negative for confusion, hallucinations, sleep disturbance and suicidal ideas.  All other systems reviewed and are negative.        Observations/Objective: No vital signs or physical exam, this was a telephone or virtual health encounter.  Pt alert and oriented, answers all questions appropriately, and able to speak in full sentences.    Assessment and Plan: Stephina was seen today for urinary tract infection.  Diagnoses and all orders for this visit:  Dysuria Frequency of micturition Urgency of micturition Three days of  dysuria, urgency, and frequency without red flags concerning for pyelonephritis or urosepsis. Reported symptoms consistent with acute cystitis. Last urine culture reviewed and will treat with Cipro based on this culture and sensitivity result. Symptomatic care discussed. Pt aware to report any new, worsening, or persistent symptoms. Recheck urinalysis in 2 weeks.  -     ciprofloxacin (CIPRO) 500 MG tablet; Take 1 tablet (500 mg total) by mouth 2 (two) times daily for 5 days.     Follow Up Instructions: Return in about 2 weeks (around 05/03/2019), or if symptoms worsen or fail to improve.    I discussed the assessment and treatment plan with the patient. The patient was provided an opportunity to ask questions and all were answered. The patient agreed with the plan and demonstrated an understanding of the instructions.   The patient was advised to call back or seek an in-person evaluation if the symptoms  worsen or if the condition fails to improve as anticipated.  The above assessment and management plan was discussed with the patient. The patient verbalized understanding of and has agreed to the management plan. Patient is aware to call the clinic if they develop any new symptoms or if symptoms persist or worsen. Patient is aware when to return to the clinic for a follow-up visit. Patient educated on when it is appropriate to go to the emergency department.    I provided 10 minutes of non-face-to-face time during this encounter. The call started at 1635. The call ended at [place]. The other time was used for coordination of care.    Monia Pouch, FNP-C [facility] [address] [place], [place] [address] [phone] 04/19/19

## 2019-05-10 ENCOUNTER — Other Ambulatory Visit: Payer: Self-pay | Admitting: Cardiology

## 2019-07-12 ENCOUNTER — Telehealth: Payer: Self-pay | Admitting: Family Medicine

## 2019-07-12 NOTE — Telephone Encounter (Signed)
Yes, please have her schedule a televisit or come into the office

## 2019-07-12 NOTE — Telephone Encounter (Signed)
What symptoms do you have? Pain when urinating and burning, frequent urination  How long have you been sick? Since 12/25/200, would like antibiotic  Have you been seen for this problem? no  If your provider decides to give you a prescription, which pharmacy would you like for it to be sent to? [facility]   Patient informed that this information will be sent to the clinical staff for review and that they should receive a follow up call.

## 2019-07-12 NOTE — Telephone Encounter (Signed)
Does pt ntbs? Can she bring in urine sample and drop off? Please advise

## 2019-07-12 NOTE — Telephone Encounter (Signed)
Pt aware of provider recommendation and pt states she will just wait till Dr Dettinger is in office again and call back. Advised pt this is office policy and it won't matter she will still need to do televisit or some kind of visit for rx of antibiotics. Pt declined.

## 2019-07-12 NOTE — Telephone Encounter (Signed)
Since it is office policy that all antibiotics require evaluation, would recommend UC evaluation if no appointments available today.

## 2019-07-12 NOTE — Telephone Encounter (Signed)
Pt is having sinus drainage, runny nose so she can't come into the office. Can you send something in or should she go to Urgent Care. She is c/o urgency and dysuria and taking AZO to help.

## 2019-08-07 ENCOUNTER — Other Ambulatory Visit: Payer: Self-pay | Admitting: Cardiology

## 2019-09-07 DIAGNOSIS — Z7189 Other specified counseling: Secondary | ICD-10-CM | POA: Insufficient documentation

## 2019-09-07 DIAGNOSIS — I6529 Occlusion and stenosis of unspecified carotid artery: Secondary | ICD-10-CM | POA: Insufficient documentation

## 2019-09-07 DIAGNOSIS — I350 Nonrheumatic aortic (valve) stenosis: Secondary | ICD-10-CM | POA: Insufficient documentation

## 2019-09-07 NOTE — Progress Notes (Signed)
Cardiology Office Note   Date:  09/08/2019   ID:  Michelle [name], DOB 24-Apr-1934, MRN [id number]  PCP:  Dettinger, Fransisca Kaufmann, MD  Cardiologist:   No primary care provider on file.   Chief Complaint  Patient presents with  . Anxiety      History of Present Illness: Michelle [name] is a 86 y.o. female who presents for follow up of mild to moderate AS.  She presents today.  She is having a lot of trouble with poor sleep and anxiety.  She worries about her husband's health.  She gets around with out a walker or a cane and in fact goes up and down the stairs in her basement to do her laundry.  She gets short of breath sometimes when she is anxious but she is not describing significant shortness of breath when she is climbing the stairs.  She is not describing PND or orthopnea although she really does not sleep very much through the night.  She is not having any new chest pressure, neck or arm discomfort.  She had no palpitations, presyncope or syncope.  She sometimes gets some dizziness but is not really describing orthostasis.   Past Medical History:  Diagnosis Date  . Aortic stenosis    Mild echo 2014  . Diverticulosis   . Duodenitis without mention of hemorrhage   . Esophagitis, unspecified   . IBS (irritable bowel syndrome)   . Uterine cancer [facility])     Past Surgical History:  Procedure Laterality Date  . FOOT SURGERY     bilateral   . VAGINAL HYSTERECTOMY       Current Outpatient Medications  Medication Sig Dispense Refill  . acetaminophen (TYLENOL) 500 MG tablet Take 500 mg by mouth every 6 (six) hours as needed for headache.    Marland Kitchen amLODipine (NORVASC) 5 MG tablet Take 1 tablet (5 mg total) by mouth daily. Please keep upcoming appt for refills. Thank you 90 tablet 0  . cetirizine (ZYRTEC ALLERGY) 10 MG tablet Take 1 tablet (10 mg total) by mouth daily. As needed 30 tablet 5  . Cholecalciferol (VITAMIN D3) 2000 UNITS TABS Take by mouth daily.      . fluticasone (FLONASE)  50 MCG/ACT nasal spray Place 1 spray into both nostrils 2 (two) times daily as needed for allergies or rhinitis. 16 g 2  . hydrochlorothiazide (HYDRODIURIL) 25 MG tablet TAKE 1 TABLET DAILY. 90 tablet 3  . polyethylene glycol (MIRALAX / GLYCOLAX) packet Take 17 g by mouth daily. 14 each 0  . Probiotic Product (ALIGN PO) Take by mouth daily.    . Wheat Dextrin (BENEFIBER) POWD Take by mouth. Takes in the morning     No current facility-administered medications for this visit.    Allergies:   Nitrofurantoin, Lisinopril, and Sulfonamide derivatives    ROS:  Please see the history of present illness.   Otherwise, review of systems are positive for none.   All other systems are reviewed and negative.    PHYSICAL EXAM: VS:  BP (!) 146/70   Pulse 80   Ht 5\' 7"  (1.702 m)   Wt 154 lb (69.9 kg)   BMI 24.12 kg/m  , BMI Body mass index is 24.12 kg/m. GENERAL:  Well appearing NECK:  No jugular venous distention, waveform within normal limits, carotid upstroke brisk and symmetric, no bruits, no thyromegaly LUNGS:  Clear to auscultation bilaterally BACK:  No CVA tenderness CHEST:  Unremarkable HEART:  PMI not displaced or  sustained,S1 and S2 within normal limits, no S3, no S4, no clicks, no rubs, 3 out of 6 apical systolic murmur mid peaking, no diastolic murmurs ABD:  Flat, positive bowel sounds normal in frequency in pitch, no bruits, no rebound, no guarding, no midline pulsatile mass, no hepatomegaly, no splenomegaly EXT:  2 plus pulses throughout, no edema, no cyanosis no clubbing SKIN:  No rashes no nodules   EKG:  EKG is ordered today. The ekg ordered today demonstrates sinus rhythm, rate 80, left axis deviation, premature atrial contractions, poor anterior R wave progression, no change from previous   Recent Labs: 12/11/2018: ALT 16; BUN 16; Creatinine, Ser 0.81; Hemoglobin 12.7; Platelets 178; Potassium 3.3; Sodium 143    Lipid Panel    Component Value Date/Time   CHOL 254 (H)  12/11/2018 1134   TRIG 53 12/11/2018 1134   TRIG 67 12/01/2014 1039   HDL 82 12/11/2018 1134   HDL 81 12/01/2014 1039   CHOLHDL 3.1 12/11/2018 1134   LDLCALC 161 (H) 12/11/2018 1134   LDLCALC 169 (H) 02/11/2013 0912      Wt Readings from Last 3 Encounters:  09/08/19 154 lb (69.9 kg)  12/11/18 156 lb 12.8 oz (71.1 kg)  12/03/18 156 lb (70.8 kg)      Other studies Reviewed: Additional studies/ records that were reviewed today include:Labs. Review of the above records demonstrates:  Please see elsewhere in the note.     ASSESSMENT AND PLAN:  AORTIC STENOSIS:  Her aortic stenosis was moderate in 2019.  I will check another echo cardiogram this spring.  Further suggestions will be based on these results.  Earlier this year.  No change in therapy.  No further imaging until after I seen her next year.    HTN:  The blood pressure is at target.  No change in therapy.   CAROTID STENOSIS:  This was mild two years ago.  No further imaging at this time.  INSOMNIA: I have suggested trying melatonin and scheduling an appointment with her primary care doctor to discuss this.   COVID EDUCATION: We talked about the vaccine and she will sign up for it.   Current medicines are reviewed at length with the patient today.  The patient does not have concerns regarding medicines.  The following changes have been made:  no change  Labs/ tests ordered today include:   Orders Placed This Encounter  Procedures  . EKG 12-Lead  . ECHOCARDIOGRAM COMPLETE     Disposition:   FU with me in one year.     Signed, Minus Breeding, MD  09/08/2019 4:50 PM    [facility]

## 2019-09-08 ENCOUNTER — Ambulatory Visit (INDEPENDENT_AMBULATORY_CARE_PROVIDER_SITE_OTHER): Payer: Medicare Other | Admitting: Cardiology

## 2019-09-08 ENCOUNTER — Encounter: Payer: Self-pay | Admitting: Cardiology

## 2019-09-08 VITALS — BP 146/70 | HR 80 | Ht 67.0 in | Wt 154.0 lb

## 2019-09-08 DIAGNOSIS — Z7189 Other specified counseling: Secondary | ICD-10-CM

## 2019-09-08 DIAGNOSIS — I350 Nonrheumatic aortic (valve) stenosis: Secondary | ICD-10-CM

## 2019-09-08 DIAGNOSIS — I10 Essential (primary) hypertension: Secondary | ICD-10-CM

## 2019-09-08 DIAGNOSIS — I6529 Occlusion and stenosis of unspecified carotid artery: Secondary | ICD-10-CM | POA: Diagnosis not present

## 2019-09-08 NOTE — Patient Instructions (Signed)
Medication Instructions:  The current medical regimen is effective;  continue present plan and medications.  *If you need a refill on your cardiac medications before your next appointment, please call your pharmacy*  Testing/Procedures: Your physician has requested that you have an echocardiogram in May 2021. This will be completed at [facility] and will be contacted at a later date to be scheduled for this testing. Echocardiography is a painless test that uses sound waves to create images of your heart. It provides your doctor with information about the size and shape of your heart and how well your heart's chambers and valves are working. This procedure takes approximately one hour. There are no restrictions for this procedure.  Follow-Up: At [facility], you and your health needs are our priority.  As part of our continuing mission to provide you with exceptional heart care, we have created designated Provider Care Teams.  These Care Teams include your primary Cardiologist (physician) and Advanced Practice Providers (APPs -  Physician Assistants and Nurse Practitioners) who all work together to provide you with the care you need, when you need it.  Your next appointment:   12 month(s)  The format for your next appointment:   In Person  Provider:   Minus Breeding, MD  Thank you for choosing [facility]!!

## 2019-10-04 ENCOUNTER — Ambulatory Visit (INDEPENDENT_AMBULATORY_CARE_PROVIDER_SITE_OTHER): Payer: Medicare Other | Admitting: Family Medicine

## 2019-10-04 ENCOUNTER — Other Ambulatory Visit: Payer: Self-pay

## 2019-10-04 DIAGNOSIS — R350 Frequency of micturition: Secondary | ICD-10-CM

## 2019-10-04 LAB — URINALYSIS
Bilirubin, UA: NEGATIVE
Glucose, UA: NEGATIVE
Ketones, UA: NEGATIVE
Leukocytes,UA: NEGATIVE
Nitrite, UA: NEGATIVE
Protein,UA: NEGATIVE
RBC, UA: NEGATIVE
Specific Gravity, UA: 1.015 (ref 1.005–1.030)
Urobilinogen, Ur: 0.2 mg/dL (ref 0.2–1.0)
pH, UA: 6 (ref 5.0–7.5)

## 2019-10-04 NOTE — Progress Notes (Signed)
Telephone visit  Subjective: CC: ?UTI PCP: Dettinger, Fransisca Kaufmann, MD [id number] Michelle [name] is a 86 y.o. female calls for telephone consult today. Patient provides verbal consent for consult held via phone.  Due to COVID-19 pandemic this visit was conducted virtually. This visit type was conducted due to national recommendations for restrictions regarding the COVID-19 Pandemic (e.g. social distancing, sheltering in place) in an effort to limit this patient's exposure and mitigate transmission in our community. All issues noted in this document were discussed and addressed.  A physical exam was not performed with this format.   Location of patient: home Location of provider: [facility] Others present for call: husband  1. Urinary symptoms Patient reports a 1 day h/o urinary frequency. She had sierra mist yesterday and she never drinks soda.  She also drank some coffee today.  Denies urgency, hematuria, fevers, chills, abdominal pain, nausea, vomiting, back pain, vaginal discharge.  Patient has used nothing for symptoms.  Patient denies a h/o frequent or recurrent UTIs.     ROS: Per HPI  Allergies  Allergen Reactions  . Nitrofurantoin     Pt does not remember this medicine  . Lisinopril Cough  . Sulfonamide Derivatives Rash   Past Medical History:  Diagnosis Date  . Aortic stenosis    Mild echo 2014  . Diverticulosis   . Duodenitis without mention of hemorrhage   . Esophagitis, unspecified   . IBS (irritable bowel syndrome)   . Uterine cancer (HCC)     Current Outpatient Medications:  .  acetaminophen (TYLENOL) 500 MG tablet, Take 500 mg by mouth every 6 (six) hours as needed for headache., Disp: , Rfl:  .  amLODipine (NORVASC) 5 MG tablet, Take 1 tablet (5 mg total) by mouth daily. Please keep upcoming appt for refills. Thank you, Disp: 90 tablet, Rfl: 0 .  cetirizine (ZYRTEC ALLERGY) 10 MG tablet, Take 1 tablet (10 mg total) by mouth daily. As needed, Disp: 30 tablet, Rfl: 5 .   Cholecalciferol (VITAMIN D3) 2000 UNITS TABS, Take by mouth daily.  , Disp: , Rfl:  .  fluticasone (FLONASE) 50 MCG/ACT nasal spray, Place 1 spray into both nostrils 2 (two) times daily as needed for allergies or rhinitis., Disp: 16 g, Rfl: 2 .  hydrochlorothiazide (HYDRODIURIL) 25 MG tablet, TAKE 1 TABLET DAILY., Disp: 90 tablet, Rfl: 3 .  polyethylene glycol (MIRALAX / GLYCOLAX) packet, Take 17 g by mouth daily., Disp: 14 each, Rfl: 0 .  Probiotic Product (ALIGN PO), Take by mouth daily., Disp: , Rfl:  .  Wheat Dextrin (BENEFIBER) POWD, Take by mouth. Takes in the morning, Disp: , Rfl:   Assessment/ Plan: 86 y.o. female   1. Urinary frequency Evidence of urinary tract infection.  Increased urinary frequency likely related to caffeine and sugar consumption.  Reassurance.  She is to monitor for other signs and symptoms that are worrisome for infection and follow-up as needed. - Urinalysis   Start time: 4:44pm End time: 4:46pm  Total time spent on patient care (including telephone call/ virtual visit): 10 minutes  [place], [place] [phone]

## 2019-10-27 ENCOUNTER — Telehealth: Payer: Self-pay | Admitting: Family Medicine

## 2019-10-27 NOTE — Chronic Care Management (AMB) (Signed)
  Chronic Care Management   Outreach Note  10/27/2019 Name: Michelle [name] MRN: [id number] DOB: 07/02/1934  Michelle [name] is a 86 y.o. year old female who is a primary care patient of Dettinger, Fransisca Kaufmann, MD. I reached out to Michelle [name] by phone today in response to a referral sent by Michelle [name] health plan.     An unsuccessful telephone outreach was attempted today. The patient was referred to the case management team for assistance with care management and care coordination.   Follow Up Plan: A HIPPA compliant phone message was left for the patient providing contact information and requesting a return call. The care management team will reach out to the patient again over the next 7 days. If patient returns call to provider office, please advise to call Michelle [name] at [phone].  [place], [place] [address] Direct Dial: [phone] [email or web address] Website: [email or web address]

## 2019-10-29 NOTE — Chronic Care Management (AMB) (Signed)
  Chronic Care Management   Outreach Note  10/29/2019 Name: [name] MRN: [id number] DOB: [birth year]  Michelle [name] is a 86 y.o. year old female who is a primary care patient of Dettinger, Fransisca Kaufmann, MD. I reached out to Melvern Sample by phone today in response to a referral sent by Ms. Inda Merlin Weekley's health plan.     A second unsuccessful telephone outreach was attempted today. The patient was referred to the case management team for assistance with care management and care coordination.   Follow Up Plan: A HIPPA compliant phone message was left for the patient providing contact information and requesting a return call. The care management team will reach out to the patient again over the next 7 days. If patient returns call to provider office, please advise to call [place] at [phone].  [place], [place] [address] Direct Dial: [phone] [email or web address] Website: [email or web address]

## 2019-10-30 ENCOUNTER — Other Ambulatory Visit: Payer: Self-pay | Admitting: Cardiology

## 2019-11-01 NOTE — Chronic Care Management (AMB) (Signed)
  Chronic Care Management   Note  11/01/2019 Name: Michelle [name] MRN: [id number] DOB: August 08, 1933  Michelle [name] is a 86 y.o. year old female who is a primary care patient of Dettinger, Fransisca Kaufmann, MD. I reached out to Melvern Sample by phone today in response to a referral sent by [name]'s health plan.     [name] was given information about Chronic Care Management services today including:  1. CCM service includes personalized support from designated clinical staff supervised by her physician, including individualized plan of care and coordination with other care providers 2. 24/7 contact phone numbers for assistance for urgent and routine care needs. 3. Service will only be billed when office clinical staff spend 20 minutes or more in a month to coordinate care. 4. Only one practitioner may furnish and bill the service in a calendar month. 5. The patient may stop CCM services at any time (effective at the end of the month) by phone call to the office staff. 6. The patient will be responsible for cost sharing (co-pay) of up to 20% of the service fee (after annual deductible is met).  Patient did not agree to enrollment in care management services and does not wish to consider at this time.  Follow up plan: The patient has been provided with contact information for the care management team and has been advised to call with any health related questions or concerns.   If patient returns call to provider office, please advise to call Smith at [phone].  [place], [place] [address] Direct Dial: [phone] [email or web address] Website: [email or web address]

## 2019-12-11 ENCOUNTER — Other Ambulatory Visit: Payer: Self-pay | Admitting: Family Medicine

## 2019-12-11 DIAGNOSIS — I10 Essential (primary) hypertension: Secondary | ICD-10-CM

## 2019-12-23 DIAGNOSIS — Z08 Encounter for follow-up examination after completed treatment for malignant neoplasm: Secondary | ICD-10-CM | POA: Diagnosis not present

## 2019-12-23 DIAGNOSIS — Z85828 Personal history of other malignant neoplasm of skin: Secondary | ICD-10-CM | POA: Diagnosis not present

## 2019-12-23 DIAGNOSIS — D225 Melanocytic nevi of trunk: Secondary | ICD-10-CM | POA: Diagnosis not present

## 2019-12-23 DIAGNOSIS — Z1283 Encounter for screening for malignant neoplasm of skin: Secondary | ICD-10-CM | POA: Diagnosis not present

## 2019-12-23 DIAGNOSIS — L820 Inflamed seborrheic keratosis: Secondary | ICD-10-CM | POA: Diagnosis not present

## 2019-12-28 DIAGNOSIS — M79676 Pain in unspecified toe(s): Secondary | ICD-10-CM | POA: Diagnosis not present

## 2019-12-28 DIAGNOSIS — B351 Tinea unguium: Secondary | ICD-10-CM | POA: Diagnosis not present

## 2019-12-30 ENCOUNTER — Ambulatory Visit (INDEPENDENT_AMBULATORY_CARE_PROVIDER_SITE_OTHER): Payer: Medicare Other | Admitting: *Deleted

## 2019-12-30 DIAGNOSIS — Z0000 Encounter for general adult medical examination without abnormal findings: Secondary | ICD-10-CM

## 2019-12-30 NOTE — Progress Notes (Signed)
MEDICARE ANNUAL WELLNESS VISIT  12/30/2019  Telephone Visit Disclaimer This Medicare AWV was conducted by telephone due to national recommendations for restrictions regarding the COVID-19 Pandemic (e.g. social distancing).  I verified, using two identifiers, that I am speaking with Michelle [name] or their authorized healthcare agent. I discussed the limitations, risks, security, and privacy concerns of performing an evaluation and management service by telephone and the potential availability of an in-person appointment in the future. The patient expressed understanding and agreed to proceed.   Subjective:  Michelle [name] is a 86 y.o. female patient of Dettinger, Fransisca Kaufmann, MD who had a Medicare Annual Wellness Visit today via telephone. [name] is Retired and lives with their spouse. she has 3 children. she reports that she is socially active and does interact with friends/family regularly. she is moderately physically active and enjoys reading, cleaning and watching the News.  Patient Care Team: Dettinger, Fransisca Kaufmann, MD as PCP - General (Family Medicine)  Advanced Directives 12/30/2019 12/03/2018  Does Patient Have a Medical Advance Directive? Yes Yes  Type of Paramedic of [place];Living will [place];Living will  Does patient want to make changes to medical advance directive? No - Patient declined No - Patient declined  Copy of [place] in Chart? No - copy requested No - copy requested    Hospital Utilization Over the Past 12 Months: # of hospitalizations or ER visits: 0 # of surgeries: 0  Review of Systems    Patient reports that her overall health is unchanged compared to last year.  History obtained from chart review  Patient Reported Readings (BP, Pulse, CBG, Weight, etc) none  Pain Assessment Pain : No/denies pain     Current Medications & Allergies (verified) Allergies as of 12/30/2019      Reactions    Nitrofurantoin    Pt does not remember this medicine   Lisinopril Cough   Sulfonamide Derivatives Rash      Medication List       Accurate as of December 30, 2019  9:22 AM. If you have any questions, ask your nurse or doctor.        acetaminophen 500 MG tablet Commonly known as: TYLENOL Take 500 mg by mouth every 6 (six) hours as needed for headache.   ALIGN PO Take by mouth daily.   amLODipine 5 MG tablet Commonly known as: NORVASC TAKE 1 TABLET DAILY   Benefiber Powd Take by mouth. Takes in the morning   cetirizine 10 MG tablet Commonly known as: ZyrTEC Allergy Take 1 tablet (10 mg total) by mouth daily. As needed   fluticasone 50 MCG/ACT nasal spray Commonly known as: FLONASE Place 1 spray into both nostrils 2 (two) times daily as needed for allergies or rhinitis.   hydrochlorothiazide 25 MG tablet Commonly known as: HYDRODIURIL TAKE 1 TABLET DAILY.   polyethylene glycol 17 g packet Commonly known as: MIRALAX / GLYCOLAX Take 17 g by mouth daily.   Vitamin D3 50 MCG (2000 UT) Tabs Take by mouth daily.       History (reviewed): Past Medical History:  Diagnosis Date  . Aortic stenosis    Mild echo 2014  . Diverticulosis   . Duodenitis without mention of hemorrhage   . Esophagitis, unspecified   . IBS (irritable bowel syndrome)   . Uterine cancer [facility])    Past Surgical History:  Procedure Laterality Date  . FOOT SURGERY     bilateral   .  VAGINAL HYSTERECTOMY     Family History  Problem Relation Age of Onset  . Heart disease Mother 50       CHF  . COPD Father   . Eczema Father   . Colon cancer Other        Per patient unsure of what cancer in family    Social History   Socioeconomic History  . Marital status: Married    Spouse name: [name]  . Number of children: 3  . Years of education: 9  . Highest education level: 9th grade  Occupational History  . Occupation: retired  Tobacco Use  . Smoking status: Never Smoker  . Smokeless tobacco:  Never Used  Vaping Use  . Vaping Use: Never used  Substance and Sexual Activity  . Alcohol use: No  . Drug use: No  . Sexual activity: Not Currently  Other Topics Concern  . Not on file  Social History Narrative   Lives with husband.     Social Determinants of Health   Financial Resource Strain: Low Risk   . Difficulty of Paying Living Expenses: Not hard at all  Food Insecurity: No Food Insecurity  . Worried About Charity fundraiser in the Last Year: Never true  . Ran Out of Food in the Last Year: Never true  Transportation Needs: No Transportation Needs  . Lack of Transportation (Medical): No  . Lack of Transportation (Non-Medical): No  Physical Activity: Inactive  . Days of Exercise per Week: 0 days  . Minutes of Exercise per Session: 0 min  Stress: No Stress Concern Present  . Feeling of Stress : Only a little  Social Connections: Socially Integrated  . Frequency of Communication with Friends and Family: More than three times a week  . Frequency of Social Gatherings with Friends and Family: More than three times a week  . Attends Religious Services: More than 4 times per year  . Active Member of Clubs or Organizations: Yes  . Attends Archivist Meetings: More than 4 times per year  . Marital Status: Married    Activities of Daily Living In your present state of health, do you have any difficulty performing the following activities: 12/30/2019  Hearing? N  Vision? N  Difficulty concentrating or making decisions? N  Walking or climbing stairs? Y  Comment feels like her balance is off at times so she takes her time and uses a walking stick when she is out in the yard  Dressing or bathing? N  Doing errands, shopping? Y  Comment she doesn't like to drive so her husband drives her or her daughter  Conservation officer, nature and eating ? N  Using the Toilet? N  In the past six months, have you accidently leaked urine? Y  Comment wears a pad at all times  Do you have  problems with loss of bowel control? N  Managing your Medications? N  Managing your Finances? N  Housekeeping or managing your Housekeeping? N  Some recent data might be hidden    Patient Education/ Literacy How often do you need to have someone help you when you read instructions, pamphlets, or other written materials from your doctor or pharmacy?: 1 - Never What is the last grade level you completed in school?: 9th grade  Exercise Current Exercise Habits: The patient does not participate in regular exercise at present (pt states she is always on the move going up and down the stairs for laundry and walking out to the  building a few times a day), Exercise limited by: orthopedic condition(s);Other - see comments (feels like her balance is off at times)  Diet Patient reports consuming 3 meals a day and 1 snack(s) a day Patient reports that her primary diet is: Regular Patient reports that she does have regular access to food.   Depression Screen PHQ 2/9 Scores 12/30/2019 12/11/2018 12/03/2018 06/15/2018 11/18/2017 08/22/2017 11/27/2016  PHQ - 2 Score 0 0 0 0 0 0 0  PHQ- 9 Score - - - 0 - - -     Fall Risk Fall Risk  12/30/2019 12/11/2018 12/03/2018 06/15/2018 11/18/2017  Falls in the past year? 0 0 0 0 No     Objective:  Michelle [name] seemed alert and oriented and she participated appropriately during our telephone visit.  Blood Pressure Weight BMI  BP Readings from Last 3 Encounters:  09/08/19 (!) 146/70  12/11/18 (!) 144/76  08/22/18 135/72   Wt Readings from Last 3 Encounters:  09/08/19 154 lb (69.9 kg)  12/11/18 156 lb 12.8 oz (71.1 kg)  12/03/18 156 lb (70.8 kg)   BMI Readings from Last 1 Encounters:  09/08/19 24.12 kg/m    *Unable to obtain current vital signs, weight, and BMI due to telephone visit type  Hearing/Vision  . Glennie did not seem to have difficulty with hearing/understanding during the telephone conversation . Reports that she has not had a formal eye exam by an  eye care professional within the past year . Reports that she has not had a formal hearing evaluation within the past year *Unable to fully assess hearing and vision during telephone visit type  Cognitive Function: 6CIT Screen 12/30/2019 12/03/2018  What Year? 0 points 0 points  What month? 0 points 0 points  What time? 0 points 0 points  Count back from 20 0 points 0 points  Months in reverse 0 points 0 points  Repeat phrase 0 points 0 points  Total Score 0 0   (Normal:0-7, Significant for Dysfunction: >8)  Normal Cognitive Function Screening: Yes   Immunization & Health Maintenance Record Immunization History  Administered Date(s) Administered  . Fluad Quad(high Dose 65+) 04/09/2019  . Influenza, High Dose Seasonal PF 04/19/2016, 04/30/2017, 04/15/2018  . Influenza,inj,Quad PF,6+ Mos 04/26/2013, 06/02/2014, 04/21/2015  . Moderna SARS-COVID-2 Vaccination 09/20/2019, 10/18/2019  . Pneumococcal Conjugate-13 12/01/2014  . Pneumococcal Polysaccharide-23 04/29/2016  . Tdap 10/15/2011    Health Maintenance  Topic Date Due  . INFLUENZA VACCINE  02/13/2020  . TETANUS/TDAP  10/14/2021  . DEXA SCAN  Completed  . COVID-19 Vaccine  Completed  . PNA vac Low Risk Adult  Completed  . MAMMOGRAM  Discontinued       Assessment  This is a routine wellness examination for Michelle [name].  Health Maintenance: Due or Overdue There are no preventive care reminders to display for this patient.  Michelle [name] does not need a referral for Community Assistance: Care Management:   no Social Work:    no Prescription Assistance:  no Nutrition/Diabetes Education:  no   Plan:  Personalized Goals Goals Addressed            This Visit's Progress   . DIET - INCREASE WATER INTAKE       Try to drink 6-8 glasses of water daily.      Personalized Health Maintenance & Screening Recommendations  Advanced directives: has an advanced directive - a copy HAS NOT been provided. Shingrix  vaccine  Lung Cancer Screening Recommended: no (  Low Dose CT Chest recommended if Age 80-80 years, 30 pack-year currently smoking OR have quit w/in past 15 years) Hepatitis C Screening recommended: no HIV Screening recommended: no  Advanced Directives: Written information was not prepared per patient's request.  Referrals & Orders No orders of the defined types were placed in this encounter.   Follow-up Plan . Follow-up with Dettinger, Fransisca Kaufmann, MD as planned . Consider Shingrix vaccine at your next visit with your PCP . Bring a copy of your Advanced Directives in for our records   I have personally reviewed and noted the following in the patient's chart:   . Medical and social history . Use of alcohol, tobacco or illicit drugs  . Current medications and supplements . Functional ability and status . Nutritional status . Physical activity . Advanced directives . List of other physicians . Hospitalizations, surgeries, and ER visits in previous 12 months . Vitals . Screenings to include cognitive, depression, and falls . Referrals and appointments  In addition, I have reviewed and discussed with Michelle [name] certain preventive protocols, quality metrics, and best practice recommendations. A written personalized care plan for preventive services as well as general preventive health recommendations is available and can be mailed to the patient at her request.      Milas Hock, LPN  [id number]

## 2020-01-10 ENCOUNTER — Encounter: Payer: Self-pay | Admitting: Family Medicine

## 2020-01-10 ENCOUNTER — Other Ambulatory Visit: Payer: Self-pay

## 2020-01-10 ENCOUNTER — Ambulatory Visit (INDEPENDENT_AMBULATORY_CARE_PROVIDER_SITE_OTHER): Payer: Medicare Other | Admitting: Family Medicine

## 2020-01-10 VITALS — BP 148/73 | HR 76 | Temp 98.0°F | Ht 67.0 in | Wt 153.0 lb

## 2020-01-10 DIAGNOSIS — Z0001 Encounter for general adult medical examination with abnormal findings: Secondary | ICD-10-CM

## 2020-01-10 DIAGNOSIS — J302 Other seasonal allergic rhinitis: Secondary | ICD-10-CM

## 2020-01-10 DIAGNOSIS — I10 Essential (primary) hypertension: Secondary | ICD-10-CM

## 2020-01-10 DIAGNOSIS — Z0000 Encounter for general adult medical examination without abnormal findings: Secondary | ICD-10-CM

## 2020-01-10 DIAGNOSIS — E559 Vitamin D deficiency, unspecified: Secondary | ICD-10-CM

## 2020-01-10 MED ORDER — FLUTICASONE PROPIONATE 50 MCG/ACT NA SUSP: 1.0000 | Freq: Two times a day (BID) | NASAL | 2 refills | Status: DC | PRN

## 2020-01-10 MED ORDER — MECLIZINE HCL 25 MG PO TABS: 25.0000 mg | ORAL_TABLET | Freq: Three times a day (TID) | ORAL | 1 refills | Status: DC | PRN

## 2020-01-10 NOTE — Progress Notes (Addendum)
BP (!) 148/73   Pulse 76   Temp 98 F (36.7 C)   Ht '5\' 7"'$  (1.702 m)   Wt 153 lb (69.4 kg)   SpO2 99%   BMI 23.96 kg/m    Subjective:   Patient ID: Michelle [name], female    DOB: 06-Aug-1933, 86 y.o.   MRN: [id number]  HPI: Michelle [name] is a 86 y.o. female presenting on 01/10/2020 for Medical Management of Chronic Issues (CPE)   HPI  Adult well exam and physical Patient is coming in for adult well exam and physical and recheck of chronic issues. Patient denies any chest pain, shortness of breath, headaches or vision issues, abdominal complaints, diarrhea, nausea, vomiting, or joint issues.   Hypertension Patient is currently on amlodipine and hydrochlorothiazide, and their blood pressure today is 148/73. Patient denies any lightheadedness or dizziness. Patient denies headaches, blurred vision, chest pains, shortness of breath, or weakness. Denies any side effects from medication and is content with current medication.   Vitamin D deficiency Patient comes in for recheck of vitamin D deficiency and physical today.  She is doing well with that.  Relevant past medical, surgical, family and social history reviewed and updated as indicated. Interim medical history since our last visit reviewed. Allergies and medications reviewed and updated.  Review of Systems  Constitutional: Negative for chills and fever.  HENT: Negative for ear pain and tinnitus.   Eyes: Negative for pain and visual disturbance.  Respiratory: Negative for cough, chest tightness, shortness of breath and wheezing.   Cardiovascular: Negative for chest pain, palpitations and leg swelling.  Gastrointestinal: Negative for abdominal pain, blood in stool, constipation and diarrhea.  Genitourinary: Negative for dysuria and hematuria.  Musculoskeletal: Negative for back pain, gait problem and myalgias.  Skin: Negative for rash.  Neurological: Negative for dizziness, weakness, light-headedness and headaches.    Psychiatric/Behavioral: Positive for sleep disturbance. Negative for agitation, behavioral problems and suicidal ideas.  All other systems reviewed and are negative.   Per HPI unless specifically indicated above   Allergies as of 01/10/2020      Reactions   Nitrofurantoin    Pt does not remember this medicine   Lisinopril Cough   Sulfonamide Derivatives Rash      Medication List       Accurate as of January 10, 2020 11:33 AM. If you have any questions, ask your nurse or doctor.        acetaminophen 500 MG tablet Commonly known as: TYLENOL Take 500 mg by mouth every 6 (six) hours as needed for headache.   ALIGN PO Take by mouth daily.   amLODipine 5 MG tablet Commonly known as: NORVASC TAKE 1 TABLET DAILY   Benefiber Powd Take by mouth. Takes in the morning   cetirizine 10 MG tablet Commonly known as: ZyrTEC Allergy Take 1 tablet (10 mg total) by mouth daily. As needed   fluticasone 50 MCG/ACT nasal spray Commonly known as: FLONASE Place 1 spray into both nostrils 2 (two) times daily as needed for allergies or rhinitis.   hydrochlorothiazide 25 MG tablet Commonly known as: HYDRODIURIL TAKE 1 TABLET DAILY.   meclizine 25 MG tablet Commonly known as: ANTIVERT Take 1 tablet (25 mg total) by mouth 3 (three) times daily as needed for dizziness. Started by: Fransisca Kaufmann Minh Jasper, MD   polyethylene glycol 17 g packet Commonly known as: MIRALAX / GLYCOLAX Take 17 g by mouth daily.   Vitamin D3 50 MCG (2000 UT) Tabs Take by  mouth daily.        Objective:   BP (!) 148/73   Pulse 76   Temp 98 F (36.7 C)   Ht '5\' 7"'$  (1.702 m)   Wt 153 lb (69.4 kg)   SpO2 99%   BMI 23.96 kg/m   Wt Readings from Last 3 Encounters:  01/10/20 153 lb (69.4 kg)  09/08/19 154 lb (69.9 kg)  12/11/18 156 lb 12.8 oz (71.1 kg)    Physical Exam Vitals and nursing note reviewed.  Constitutional:      General: She is not in acute distress.    Appearance: She is well-developed. She  is not diaphoretic.  Eyes:     Conjunctiva/sclera: Conjunctivae normal.     Pupils: Pupils are equal, round, and reactive to light.  Cardiovascular:     Rate and Rhythm: Normal rate and regular rhythm.     Heart sounds: Normal heart sounds. No murmur heard.   Pulmonary:     Effort: Pulmonary effort is normal. No respiratory distress.     Breath sounds: Normal breath sounds. No wheezing.  Abdominal:     General: Abdomen is flat. Bowel sounds are normal. There is no distension.     Tenderness: There is no abdominal tenderness. There is no right CVA tenderness or left CVA tenderness.  Musculoskeletal:        General: No tenderness. Normal range of motion.  Skin:    General: Skin is warm and dry.     Findings: No rash.  Neurological:     Mental Status: She is alert and oriented to person, place, and time.     Coordination: Coordination normal.  Psychiatric:        Behavior: Behavior normal.       Assessment & Plan:   Problem List Items Addressed This Visit      Cardiovascular and Mediastinum   Hypertension   Relevant Orders   CBC with Differential/Platelet   CMP14+EGFR   Lipid panel     Respiratory   Seasonal allergic rhinitis   Relevant Medications   fluticasone (FLONASE) 50 MCG/ACT nasal spray     Other   Vitamin D deficiency   Relevant Orders   VITAMIN D 25 Hydroxy (Vit-D Deficiency, Fractures)    Other Visit Diagnoses    Well adult exam    -  Primary      No change in medication, gave refill for meclizine, she says that she is not sleeping well so recommended to try melatonin first and if does not work then we may consider other options. Follow up plan: Return in about 1 year (around 01/09/2021), or if symptoms worsen or fail to improve, for Physical and hypertension recheck.  Counseling provided for all of the vaccine components Orders Placed This Encounter  Procedures  . CBC with Differential/Platelet  . CMP14+EGFR  . Lipid panel  . VITAMIN D 25  Hydroxy (Vit-D Deficiency, Fractures)    Caryl Pina, MD [facility] 01/10/2020, 11:33 AM

## 2020-01-11 LAB — CMP14+EGFR
ALT: 11 IU/L (ref 0–32)
AST: 20 IU/L (ref 0–40)
Albumin/Globulin Ratio: 2.1 (ref 1.2–2.2)
Albumin: 4.2 g/dL (ref 3.6–4.6)
Alkaline Phosphatase: 59 IU/L (ref 48–121)
BUN/Creatinine Ratio: 23 (ref 12–28)
BUN: 16 mg/dL (ref 8–27)
Bilirubin Total: 0.6 mg/dL (ref 0.0–1.2)
CO2: 28 mmol/L (ref 20–29)
Calcium: 9.3 mg/dL (ref 8.7–10.3)
Chloride: 100 mmol/L (ref 96–106)
Creatinine, Ser: 0.71 mg/dL (ref 0.57–1.00)
GFR calc Af Amer: 91 mL/min/{1.73_m2} (ref >59)
GFR calc non Af Amer: 79 mL/min/{1.73_m2} (ref >59)
Globulin, Total: 2 g/dL (ref 1.5–4.5)
Glucose: 90 mg/dL (ref 65–99)
Potassium: 3.3 mmol/L — ABNORMAL LOW (ref 3.5–5.2)
Sodium: 143 mmol/L (ref 134–144)
Total Protein: 6.2 g/dL (ref 6.0–8.5)

## 2020-01-11 LAB — LIPID PANEL
Chol/HDL Ratio: 3.3 ratio (ref 0.0–4.4)
Cholesterol, Total: 268 mg/dL — ABNORMAL HIGH (ref 100–199)
HDL: 81 mg/dL (ref >39)
LDL Chol Calc (NIH): 179 mg/dL — ABNORMAL HIGH (ref 0–99)
Triglycerides: 52 mg/dL (ref 0–149)
VLDL Cholesterol Cal: 8 mg/dL (ref 5–40)

## 2020-01-11 LAB — CBC WITH DIFFERENTIAL/PLATELET
Basophils Absolute: 0 10*3/uL (ref 0.0–0.2)
Basos: 1 %
EOS (ABSOLUTE): 0.1 10*3/uL (ref 0.0–0.4)
Eos: 2 %
Hematocrit: 36.9 % (ref 34.0–46.6)
Hemoglobin: 12.4 g/dL (ref 11.1–15.9)
Immature Grans (Abs): 0 10*3/uL (ref 0.0–0.1)
Immature Granulocytes: 0 %
Lymphocytes Absolute: 0.8 10*3/uL (ref 0.7–3.1)
Lymphs: 21 %
MCH: 30.8 pg (ref 26.6–33.0)
MCHC: 33.6 g/dL (ref 31.5–35.7)
MCV: 92 fL (ref 79–97)
Monocytes Absolute: 0.4 10*3/uL (ref 0.1–0.9)
Monocytes: 11 %
Neutrophils Absolute: 2.3 10*3/uL (ref 1.4–7.0)
Neutrophils: 65 %
Platelets: 195 10*3/uL (ref 150–450)
RBC: 4.03 x10E6/uL (ref 3.77–5.28)
RDW: 12.9 % (ref 11.7–15.4)
WBC: 3.5 10*3/uL (ref 3.4–10.8)

## 2020-01-11 LAB — VITAMIN D 25 HYDROXY (VIT D DEFICIENCY, FRACTURES): Vit D, 25-Hydroxy: 41.1 ng/mL (ref 30.0–100.0)

## 2020-01-13 ENCOUNTER — Other Ambulatory Visit (HOSPITAL_COMMUNITY): Payer: Medicare Other

## 2020-01-20 ENCOUNTER — Ambulatory Visit (HOSPITAL_COMMUNITY)
Admission: RE | Admit: 2020-01-20 | Discharge: 2020-01-20 | Disposition: A | Discharge status: Home / Self Care | Payer: Medicare Other | Source: Ambulatory Visit | Attending: Cardiology | Admitting: Cardiology

## 2020-01-20 ENCOUNTER — Other Ambulatory Visit: Payer: Self-pay

## 2020-01-20 DIAGNOSIS — I10 Essential (primary) hypertension: Secondary | ICD-10-CM

## 2020-01-20 DIAGNOSIS — I350 Nonrheumatic aortic (valve) stenosis: Secondary | ICD-10-CM | POA: Insufficient documentation

## 2020-01-20 NOTE — Progress Notes (Signed)
*  PRELIMINARY RESULTS* Echocardiogram 2D Echocardiogram has been performed.  Michelle [name] 01/20/2020, 11:33 AM

## 2020-02-02 ENCOUNTER — Encounter: Payer: Self-pay | Admitting: *Deleted

## 2020-03-18 ENCOUNTER — Other Ambulatory Visit: Payer: Self-pay | Admitting: Family Medicine

## 2020-03-18 DIAGNOSIS — I10 Essential (primary) hypertension: Secondary | ICD-10-CM

## 2020-03-21 NOTE — Telephone Encounter (Signed)
OV 01/10/20 RTC 1 YR next OV 01/10/21

## 2020-05-11 DIAGNOSIS — C44729 Squamous cell carcinoma of skin of left lower limb, including hip: Secondary | ICD-10-CM | POA: Diagnosis not present

## 2020-05-16 DIAGNOSIS — M79676 Pain in unspecified toe(s): Secondary | ICD-10-CM | POA: Diagnosis not present

## 2020-05-16 DIAGNOSIS — B351 Tinea unguium: Secondary | ICD-10-CM | POA: Diagnosis not present

## 2020-06-12 DIAGNOSIS — Z85828 Personal history of other malignant neoplasm of skin: Secondary | ICD-10-CM | POA: Diagnosis not present

## 2020-06-12 DIAGNOSIS — Z08 Encounter for follow-up examination after completed treatment for malignant neoplasm: Secondary | ICD-10-CM | POA: Diagnosis not present

## 2020-08-12 ENCOUNTER — Other Ambulatory Visit: Payer: Self-pay | Admitting: Cardiology

## 2020-08-22 ENCOUNTER — Other Ambulatory Visit: Payer: Self-pay | Admitting: Family Medicine

## 2020-08-22 DIAGNOSIS — J302 Other seasonal allergic rhinitis: Secondary | ICD-10-CM

## 2020-10-21 ENCOUNTER — Other Ambulatory Visit: Payer: Self-pay | Admitting: Family Medicine

## 2020-10-21 DIAGNOSIS — J302 Other seasonal allergic rhinitis: Secondary | ICD-10-CM

## 2020-11-09 ENCOUNTER — Other Ambulatory Visit: Payer: Self-pay | Admitting: Cardiology

## 2020-11-14 DIAGNOSIS — B351 Tinea unguium: Secondary | ICD-10-CM | POA: Diagnosis not present

## 2020-11-14 DIAGNOSIS — M79676 Pain in unspecified toe(s): Secondary | ICD-10-CM | POA: Diagnosis not present

## 2020-11-21 NOTE — Progress Notes (Signed)
Cardiology Office Note   Date:  11/22/2020   ID:  Michelle SCALISI, DOB 10-27-1933, MRN [id number]  PCP:  Dettinger, Fransisca Kaufmann, MD  Cardiologist:   None   Chief Complaint  Patient presents with  . Fatigue      History of Present Illness: Michelle [name] is a 87 y.o. female who presents for follow up of mild to moderate AS.  Since I last saw her she has had no new cardiac complaints.  She still climbs the stairs to her laundry and does not report any significant shortness of breath with this.  She has fatigue.  She denies any new resting shortness of breath, PND or orthopnea.  She has had no palpitations, presyncope or syncope.  He has had no weight gain or edema.  Past Medical History:  Diagnosis Date  . Aortic stenosis    Mild echo 2014  . Diverticulosis   . Duodenitis without mention of hemorrhage   . Esophagitis, unspecified   . IBS (irritable bowel syndrome)   . Uterine cancer [facility])     Past Surgical History:  Procedure Laterality Date  . FOOT SURGERY     bilateral   . VAGINAL HYSTERECTOMY       Current Outpatient Medications  Medication Sig Dispense Refill  . acetaminophen (TYLENOL) 500 MG tablet Take 500 mg by mouth every 6 (six) hours as needed for headache.    Marland Kitchen amLODipine (NORVASC) 5 MG tablet Take 1 tablet (5 mg total) by mouth daily. pls advise pt to keep upcoming appt in May for further refills. Thx 30 tablet 0  . cetirizine (ZYRTEC ALLERGY) 10 MG tablet Take 1 tablet (10 mg total) by mouth daily. As needed 30 tablet 5  . Cholecalciferol (VITAMIN D3) 2000 UNITS TABS Take by mouth daily.    . fluticasone (FLONASE) 50 MCG/ACT nasal spray 1 spray in both nostrils 2 times daily as needed for allergies or rhinitis. 16 g 2  . hydrochlorothiazide (HYDRODIURIL) 25 MG tablet TAKE 1 TABLET DAILY. 90 tablet 2  . meclizine (ANTIVERT) 25 MG tablet Take 1 tablet (25 mg total) by mouth 3 (three) times daily as needed for dizziness. 30 tablet 1  . polyethylene glycol  (MIRALAX / GLYCOLAX) packet Take 17 g by mouth daily. 14 each 0  . Wheat Dextrin (BENEFIBER) POWD Take by mouth. Takes in the morning     No current facility-administered medications for this visit.    Allergies:   Nitrofurantoin, Lisinopril, and Sulfonamide derivatives    ROS:  Please see the history of present illness.   Otherwise, review of systems are positive for reflux, anxiety and insomnia.   All other systems are reviewed and negative.    PHYSICAL EXAM: VS:  BP 140/64   Pulse 82   Ht 5\' 7"  (1.702 m)   Wt 153 lb (69.4 kg)   BMI 23.96 kg/m  , BMI Body mass index is 23.96 kg/m. GENERAL:  Well appearing NECK:  No jugular venous distention, waveform within normal limits, carotid upstroke brisk and symmetric, no bruits, no thyromegaly LUNGS:  Clear to auscultation bilaterally CHEST:  Unremarkable HEART:  PMI not displaced or sustained,S1 and S2 within normal limits, no S3, no S4, no clicks, no rubs, 3 out of 6 apical systolic murmur radiating up the aortic outflow tract and mid peaking, no diastolic murmurs ABD:  Flat, positive bowel sounds normal in frequency in pitch, no bruits, no rebound, no guarding, no midline pulsatile mass, no hepatomegaly,  no splenomegaly EXT:  2 plus pulses throughout, no edema, no cyanosis no clubbing    EKG:  EKG is  ordered today. The ekg ordered today demonstrates sinus rhythm, rate 82, left axis deviation, premature atrial contractions, poor anterior R wave progression, no change from previous   Recent Labs: 01/10/2020: ALT 11; BUN 16; Creatinine, Ser 0.71; Hemoglobin 12.4; Platelets 195; Potassium 3.3; Sodium 143    Lipid Panel    Component Value Date/Time   CHOL 268 (H) 01/10/2020 1054   TRIG 52 01/10/2020 1054   TRIG 67 12/01/2014 1039   HDL 81 01/10/2020 1054   HDL 81 12/01/2014 1039   CHOLHDL 3.3 01/10/2020 1054   LDLCALC 179 (H) 01/10/2020 1054   LDLCALC 169 (H) 02/11/2013 0912      Wt Readings from Last 3 Encounters:   11/22/20 153 lb (69.4 kg)  01/10/20 153 lb (69.4 kg)  09/08/19 154 lb (69.9 kg)      Other studies Reviewed: Additional studies/ records that were reviewed today include:  labs  . Review of the above records demonstrates:  Please see elsewhere in the note.     ASSESSMENT AND PLAN:  AORTIC STENOSIS:  Her aortic stenosis was moderate in in Sept 2021.   I am going to check an echocardiogram again in July of next year.  I would not suspect this to be clinically progressing.   She has no new symptoms.   HTN:  The blood pressure is mildly elevated but she was anxious about being here.  No change in therapy.   CAROTID STENOSIS:  This was mild in 2018.   I am going to check carotid Dopplers   Current medicines are reviewed at length with the patient today.  The patient does not have concerns regarding medicines.  The following changes have been made:  None  Labs/ tests ordered today include:    Orders Placed This Encounter  Procedures  . US Carotid Bilateral  . EKG 12-Lead     Disposition:   FU with me after the echo next year.    Signed, Minus Breeding, MD  11/22/2020 2:01 PM    [facility]

## 2020-11-22 ENCOUNTER — Encounter: Payer: Self-pay | Admitting: Cardiology

## 2020-11-22 ENCOUNTER — Ambulatory Visit: Payer: Medicare Other | Admitting: Cardiology

## 2020-11-22 ENCOUNTER — Other Ambulatory Visit: Payer: Self-pay

## 2020-11-22 VITALS — BP 140/64 | HR 82 | Ht 67.0 in | Wt 153.0 lb

## 2020-11-22 DIAGNOSIS — I6529 Occlusion and stenosis of unspecified carotid artery: Secondary | ICD-10-CM

## 2020-11-22 DIAGNOSIS — I359 Nonrheumatic aortic valve disorder, unspecified: Secondary | ICD-10-CM

## 2020-11-22 DIAGNOSIS — I10 Essential (primary) hypertension: Secondary | ICD-10-CM

## 2020-11-22 NOTE — Patient Instructions (Signed)
Medication Instructions:  The current medical regimen is effective;  continue present plan and medications.  *If you need a refill on your cardiac medications before your next appointment, please call your pharmacy*  Testing/Procedures: Your physician has requested that you have a carotid duplex at [facility]. This test is an ultrasound of the carotid arteries in your neck. It looks at blood flow through these arteries that supply the brain with blood. Allow one hour for this exam. There are no restrictions or special instructions.  You will be contacted to be scheduled for this testing.  Your physician has requested that you have an echocardiogram in July 2023. Echocardiography is a painless test that uses sound waves to create images of your heart. It provides your doctor with information about the size and shape of your heart and how well your heart's chambers and valves are working. This procedure takes approximately one hour. There are no restrictions for this procedure.  Follow-Up: At [facility], you and your health needs are our priority.  As part of our continuing mission to provide you with exceptional heart care, we have created designated Provider Care Teams.  These Care Teams include your primary Cardiologist (physician) and Advanced Practice Providers (APPs -  Physician Assistants and Nurse Practitioners) who all work together to provide you with the care you need, when you need it.  We recommend signing up for the patient portal called "MyChart".  Sign up information is provided on this After Visit Summary.  MyChart is used to connect with patients for Virtual Visits (Telemedicine).  Patients are able to view lab/test results, encounter notes, upcoming appointments, etc.  Non-urgent messages can be sent to your provider as well.   To learn more about what you can do with MyChart, go to [email or web address].    Your next appointment:   14 month(s) (July 2023)  The format for  your next appointment:   In Person  Provider:   Minus Breeding, MD   Thank you for choosing [facility]!!

## 2020-11-30 ENCOUNTER — Other Ambulatory Visit: Payer: Self-pay

## 2020-11-30 ENCOUNTER — Ambulatory Visit (HOSPITAL_COMMUNITY)
Admission: RE | Admit: 2020-11-30 | Discharge: 2020-11-30 | Disposition: A | Discharge status: Home / Self Care | Payer: Medicare Other | Source: Ambulatory Visit | Attending: Cardiology | Admitting: Cardiology

## 2020-11-30 DIAGNOSIS — I6529 Occlusion and stenosis of unspecified carotid artery: Secondary | ICD-10-CM | POA: Diagnosis not present

## 2020-11-30 DIAGNOSIS — I6521 Occlusion and stenosis of right carotid artery: Secondary | ICD-10-CM | POA: Diagnosis not present

## 2020-12-15 ENCOUNTER — Other Ambulatory Visit: Payer: Self-pay | Admitting: Family Medicine

## 2020-12-15 DIAGNOSIS — I10 Essential (primary) hypertension: Secondary | ICD-10-CM

## 2020-12-22 ENCOUNTER — Other Ambulatory Visit: Payer: Self-pay | Admitting: Cardiology

## 2021-01-10 ENCOUNTER — Ambulatory Visit (INDEPENDENT_AMBULATORY_CARE_PROVIDER_SITE_OTHER): Payer: Medicare Other | Admitting: Family Medicine

## 2021-01-10 ENCOUNTER — Other Ambulatory Visit: Payer: Self-pay

## 2021-01-10 ENCOUNTER — Telehealth: Payer: Self-pay | Admitting: Cardiology

## 2021-01-10 ENCOUNTER — Encounter: Payer: Self-pay | Admitting: Family Medicine

## 2021-01-10 VITALS — BP 121/87 | HR 83 | Ht 67.0 in | Wt 154.0 lb

## 2021-01-10 DIAGNOSIS — I10 Essential (primary) hypertension: Secondary | ICD-10-CM

## 2021-01-10 DIAGNOSIS — E559 Vitamin D deficiency, unspecified: Secondary | ICD-10-CM | POA: Diagnosis not present

## 2021-01-10 DIAGNOSIS — F411 Generalized anxiety disorder: Secondary | ICD-10-CM

## 2021-01-10 DIAGNOSIS — M8580 Other specified disorders of bone density and structure, unspecified site: Secondary | ICD-10-CM | POA: Diagnosis not present

## 2021-01-10 DIAGNOSIS — Z0001 Encounter for general adult medical examination with abnormal findings: Secondary | ICD-10-CM

## 2021-01-10 DIAGNOSIS — Z0000 Encounter for general adult medical examination without abnormal findings: Secondary | ICD-10-CM | POA: Diagnosis not present

## 2021-01-10 DIAGNOSIS — R350 Frequency of micturition: Secondary | ICD-10-CM | POA: Diagnosis not present

## 2021-01-10 MED ORDER — MECLIZINE HCL 25 MG PO TABS: 25.0000 mg | ORAL_TABLET | Freq: Three times a day (TID) | ORAL | 3 refills | Status: DC | PRN

## 2021-01-10 MED ORDER — SERTRALINE HCL 50 MG PO TABS: 50.0000 mg | ORAL_TABLET | Freq: Every day | ORAL | 5 refills | Status: DC

## 2021-01-10 MED ORDER — HYDROCHLOROTHIAZIDE 25 MG PO TABS: 25.0000 mg | ORAL_TABLET | Freq: Every day | ORAL | 3 refills | Status: DC

## 2021-01-10 MED ORDER — AMLODIPINE BESYLATE 5 MG PO TABS: 5.0000 mg | ORAL_TABLET | Freq: Every day | ORAL | 3 refills | Status: DC

## 2021-01-10 NOTE — Telephone Encounter (Signed)
*  STAT* If patient is at the pharmacy, call can be transferred to refill team.   1. Which medications need to be refilled? (please list name of each medication and dose if known) amLODipine (NORVASC) 5 MG tablet  2. Which pharmacy/location (including street and city if local pharmacy) is medication to be sent to? [place]   3. Do they need a 30 day or 90 day supply? [place]

## 2021-01-10 NOTE — Telephone Encounter (Signed)
Medication refill request for Amlodipine 5 mg tablets approved and sent to [facility] per pt request.

## 2021-01-10 NOTE — Progress Notes (Signed)
BP 121/87   Pulse 83   Ht _0  (1.702 m)   Wt 154 lb (69.9 kg)   SpO2 96%   BMI 24.12 kg/m    Subjective:   Patient ID: Michelle [name], female    DOB: 11-01-1933, 87 y.o.   MRN: [id number]  HPI: Michelle [name] is a 87 y.o. female presenting on 01/10/2021 for Medical Management of Chronic Issues (CPE), Hypertension, Anxiety (States that she always has so much to do ( house work)), and Urinary Tract Infection (C/O nocturia)   HPI Physical exam Patient denies any chest pain, shortness of breath, headaches or vision issues, abdominal complaints, diarrhea, nausea, vomiting, or joint issues.  Patient does have some urinary frequency and some incontinence and she has been increased over the past couple months and wants to leave urine to see if she has any kind of infection.  Patient denies any dysuria, she says occasionally she has darkened urine.  Hypertension Patient is currently on hydrochlorothiazide and amlodipine, and their blood pressure today is 121/87. Patient denies any lightheadedness or dizziness. Patient denies headaches, blurred vision, chest pains, shortness of breath, or weakness. Denies any side effects from medication and is content with current medication.   Osteoporosis/osteopenia Fractures or history of fracture: None Medication: Calcium and vitamin D Duration of treatment: Few years Last bone density scan: 2014 Last T score: -2.8  Vitamin D deficiency Patient is coming in for vitamin D deficiency recheck.  She is taking the vitamin D 2000 daily and denies any issues.  Anxiety Patient is coming in today since she has been having some anxiety.  Patient says her anxiety just builds up to a certain point.  She says some days is better some days is worse but a lot of it is just that is overwhelming where she cannot relax and it feels like she has to keep going and doing things gets overwhelming and she wants to see if there is something that can just calm her  down.  Relevant past medical, surgical, family and social history reviewed and updated as indicated. Interim medical history since our last visit reviewed. Allergies and medications reviewed and updated.  Review of Systems  Constitutional:  Negative for chills and fever.  Eyes:  Negative for visual disturbance.  Respiratory:  Negative for chest tightness and shortness of breath.   Cardiovascular:  Negative for chest pain and leg swelling.  Musculoskeletal:  Negative for back pain and gait problem.  Skin:  Negative for rash.  Neurological:  Negative for light-headedness and headaches.  Psychiatric/Behavioral:  Negative for agitation and behavioral problems.   All other systems reviewed and are negative.  Per HPI unless specifically indicated above   Allergies as of 01/10/2021       Reactions   Nitrofurantoin    Pt does not remember this medicine   Lisinopril Cough   Sulfonamide Derivatives Rash        Medication List        Accurate as of January 10, 2021 10:42 AM. If you have any questions, ask your nurse or doctor.          acetaminophen 500 MG tablet Commonly known as: TYLENOL Take 500 mg by mouth every 6 (six) hours as needed for headache.   amLODipine 5 MG tablet Commonly known as: NORVASC TAKE 1 TABLET DAILY   Benefiber Powd Take by mouth. Takes in the morning   cetirizine 10 MG tablet Commonly known as: ZyrTEC Allergy Take 1 tablet (  10 mg total) by mouth daily. As needed   fluticasone 50 MCG/ACT nasal spray Commonly known as: FLONASE 1 spray in both nostrils 2 times daily as needed for allergies or rhinitis.   hydrochlorothiazide 25 MG tablet Commonly known as: HYDRODIURIL Take 1 tablet (25 mg total) by mouth daily.   meclizine 25 MG tablet Commonly known as: ANTIVERT Take 1 tablet (25 mg total) by mouth 3 (three) times daily as needed for dizziness.   polyethylene glycol 17 g packet Commonly known as: MIRALAX / GLYCOLAX Take 17 g by mouth  daily.   sertraline 50 MG tablet Commonly known as: Zoloft Take 1 tablet (50 mg total) by mouth daily. Started by: Worthy Rancher, MD   Vitamin D3 50 MCG (2000 UT) Tabs Take by mouth daily.         Objective:   BP 121/87   Pulse 83   Ht _0  (1.702 m)   Wt 154 lb (69.9 kg)   SpO2 96%   BMI 24.12 kg/m   Wt Readings from Last 3 Encounters:  01/10/21 154 lb (69.9 kg)  11/22/20 153 lb (69.4 kg)  01/10/20 153 lb (69.4 kg)    Physical Exam Vitals and nursing note reviewed.  Constitutional:      General: She is not in acute distress.    Appearance: She is well-developed. She is not diaphoretic.  Eyes:     Conjunctiva/sclera: Conjunctivae normal.     Pupils: Pupils are equal, round, and reactive to light.  Cardiovascular:     Rate and Rhythm: Normal rate and regular rhythm.     Heart sounds: Normal heart sounds. No murmur heard. Pulmonary:     Effort: Pulmonary effort is normal. No respiratory distress.     Breath sounds: Normal breath sounds. No wheezing.  Abdominal:     General: Abdomen is flat. Bowel sounds are normal.  Musculoskeletal:        General: No tenderness. Normal range of motion.  Skin:    General: Skin is warm and dry.     Findings: No rash.  Neurological:     Mental Status: She is alert and oriented to person, place, and time.     Coordination: Coordination normal.  Psychiatric:        Mood and Affect: Mood is anxious. Mood is not depressed.        Behavior: Behavior normal.        Thought Content: Thought content does not include suicidal ideation. Thought content does not include suicidal plan.     Urinalysis: 1+ blood but otherwise negative. Assessment & Plan:   Problem List Items Addressed This Visit       Cardiovascular and Mediastinum   Hypertension   Relevant Medications   hydrochlorothiazide (HYDRODIURIL) 25 MG tablet     Musculoskeletal and Integument   Osteopenia   Relevant Orders   DG [facility] DEXA     Other   Vitamin D  deficiency   Relevant Orders   CBC with Differential/Platelet   CMP14+EGFR   Lipid panel   VITAMIN D 25 Hydroxy (Vit-D Deficiency, Fractures)   Other Visit Diagnoses     Essential hypertension    -  Primary   Relevant Medications   hydrochlorothiazide (HYDRODIURIL) 25 MG tablet   Other Relevant Orders   CBC with Differential/Platelet   CMP14+EGFR   Lipid panel   VITAMIN D 25 Hydroxy (Vit-D Deficiency, Fractures)   Well adult exam       Relevant Orders  CBC with Differential/Platelet   CMP14+EGFR   Lipid panel   VITAMIN D 25 Hydroxy (Vit-D Deficiency, Fractures)   TSH   Urinary frequency       Relevant Orders   Urinalysis   Urine Culture   GAD (generalized anxiety disorder)       Relevant Medications   sertraline (ZOLOFT) 50 MG tablet       Return in 1 month to see if anxiety medicine is helping.  Started Zoloft.  Will await urinalysis but may consider a bladder medicine to help for incontinence in the future such as Myrbetriq versus Vesicare.  For now will await culture. Follow up plan: Return in about 1 week (around 01/17/2021), or if symptoms worsen or fail to improve, for Anxiety.  Counseling provided for all of the vaccine components Orders Placed This Encounter  Procedures   Urine Culture   DG [facility] DEXA   CBC with Differential/Platelet   CMP14+EGFR   Lipid panel   VITAMIN D 25 Hydroxy (Vit-D Deficiency, Fractures)   Urinalysis   TSH    Caryl Pina, MD [facility] 01/10/2021, 10:42 AM

## 2021-01-11 LAB — CBC WITH DIFFERENTIAL/PLATELET
Basophils Absolute: 0 10*3/uL (ref 0.0–0.2)
Basos: 1 %
EOS (ABSOLUTE): 0.1 10*3/uL (ref 0.0–0.4)
Eos: 1 %
Hematocrit: 39.2 % (ref 34.0–46.6)
Hemoglobin: 12.5 g/dL (ref 11.1–15.9)
Immature Grans (Abs): 0 10*3/uL (ref 0.0–0.1)
Immature Granulocytes: 0 %
Lymphocytes Absolute: 0.7 10*3/uL (ref 0.7–3.1)
Lymphs: 15 %
MCH: 29.8 pg (ref 26.6–33.0)
MCHC: 31.9 g/dL (ref 31.5–35.7)
MCV: 94 fL (ref 79–97)
Monocytes Absolute: 0.4 10*3/uL (ref 0.1–0.9)
Monocytes: 8 %
Neutrophils Absolute: 3.6 10*3/uL (ref 1.4–7.0)
Neutrophils: 75 %
Platelets: 203 10*3/uL (ref 150–450)
RBC: 4.19 x10E6/uL (ref 3.77–5.28)
RDW: 12.5 % (ref 11.7–15.4)
WBC: 4.7 10*3/uL (ref 3.4–10.8)

## 2021-01-11 LAB — CMP14+EGFR
ALT: 10 IU/L (ref 0–32)
AST: 22 IU/L (ref 0–40)
Albumin/Globulin Ratio: 2.5 — ABNORMAL HIGH (ref 1.2–2.2)
Albumin: 4.5 g/dL (ref 3.6–4.6)
Alkaline Phosphatase: 59 IU/L (ref 44–121)
BUN/Creatinine Ratio: 24 (ref 12–28)
BUN: 18 mg/dL (ref 8–27)
Bilirubin Total: 0.6 mg/dL (ref 0.0–1.2)
CO2: 28 mmol/L (ref 20–29)
Calcium: 9.7 mg/dL (ref 8.7–10.3)
Chloride: 98 mmol/L (ref 96–106)
Creatinine, Ser: 0.75 mg/dL (ref 0.57–1.00)
Globulin, Total: 1.8 g/dL (ref 1.5–4.5)
Glucose: 101 mg/dL — ABNORMAL HIGH (ref 65–99)
Potassium: 3.6 mmol/L (ref 3.5–5.2)
Sodium: 141 mmol/L (ref 134–144)
Total Protein: 6.3 g/dL (ref 6.0–8.5)
eGFR: 78 mL/min/{1.73_m2} (ref >59)

## 2021-01-11 LAB — LIPID PANEL
Chol/HDL Ratio: 3 ratio (ref 0.0–4.4)
Cholesterol, Total: 247 mg/dL — ABNORMAL HIGH (ref 100–199)
HDL: 83 mg/dL (ref >39)
LDL Chol Calc (NIH): 157 mg/dL — ABNORMAL HIGH (ref 0–99)
Triglycerides: 47 mg/dL (ref 0–149)
VLDL Cholesterol Cal: 7 mg/dL (ref 5–40)

## 2021-01-11 LAB — TSH: TSH: 2.14 u[IU]/mL (ref 0.450–4.500)

## 2021-01-11 LAB — VITAMIN D 25 HYDROXY (VIT D DEFICIENCY, FRACTURES): Vit D, 25-Hydroxy: 52.6 ng/mL (ref 30.0–100.0)

## 2021-01-23 DIAGNOSIS — B351 Tinea unguium: Secondary | ICD-10-CM | POA: Diagnosis not present

## 2021-01-23 DIAGNOSIS — M79676 Pain in unspecified toe(s): Secondary | ICD-10-CM | POA: Diagnosis not present

## 2021-02-20 DIAGNOSIS — H2513 Age-related nuclear cataract, bilateral: Secondary | ICD-10-CM | POA: Diagnosis not present

## 2021-02-20 DIAGNOSIS — H43813 Vitreous degeneration, bilateral: Secondary | ICD-10-CM | POA: Diagnosis not present

## 2021-02-23 ENCOUNTER — Other Ambulatory Visit: Payer: Self-pay

## 2021-02-23 ENCOUNTER — Encounter: Payer: Self-pay | Admitting: Family Medicine

## 2021-02-23 ENCOUNTER — Ambulatory Visit (INDEPENDENT_AMBULATORY_CARE_PROVIDER_SITE_OTHER): Payer: Medicare Other

## 2021-02-23 ENCOUNTER — Ambulatory Visit (INDEPENDENT_AMBULATORY_CARE_PROVIDER_SITE_OTHER): Payer: Medicare Other | Admitting: Family Medicine

## 2021-02-23 VITALS — BP 146/59 | HR 75 | Ht 67.0 in | Wt 151.0 lb

## 2021-02-23 DIAGNOSIS — M8588 Other specified disorders of bone density and structure, other site: Secondary | ICD-10-CM | POA: Diagnosis not present

## 2021-02-23 DIAGNOSIS — F411 Generalized anxiety disorder: Secondary | ICD-10-CM | POA: Diagnosis not present

## 2021-02-23 NOTE — Progress Notes (Signed)
BP (!) 146/59   Pulse 75   Ht '5\' 7"'$  (1.702 m)   Wt 151 lb (68.5 kg)   SpO2 96%   BMI 23.65 kg/m    Subjective:   Patient ID: Michelle [name], female    DOB: [birth year], 87 y.o.   MRN: [id number]  HPI: Michelle [name] is a 87 y.o. female presenting on 02/23/2021 for Anxiety (6 week follow up. Pt only took 2 tabs only. States that the new medication made her feel more anxious and jittery.)   HPI Anxiety Patient is coming in for anxiety follow-up.  She only took 2 tablets of the medicines and it did not make her feel well and she felt jittery and more anxious and stopped it.  She does not Nestl want to try anything further.  She says her anxiety has calmed down some and she feels like she is doing okay right now.  She denies any suicidal ideations or thoughts of hurting herself. Depression screen [facility] 2/9 02/23/2021 01/10/2021 01/10/2020 12/30/2019 12/11/2018  Decreased Interest 0 0 0 0 0  Down, Depressed, Hopeless 0 1 0 0 0  PHQ - 2 Score 0 1 0 0 0  Altered sleeping 1 - - - -  Tired, decreased energy 0 - - - -  Change in appetite 0 - - - -  Feeling bad or failure about yourself  0 - - - -  Trouble concentrating 0 - - - -  Moving slowly or fidgety/restless 0 - - - -  Suicidal thoughts 0 - - - -  PHQ-9 Score - - - - -  Difficult doing work/chores - - - - -     Relevant past medical, surgical, family and social history reviewed and updated as indicated. Interim medical history since our last visit reviewed. Allergies and medications reviewed and updated.  Review of Systems  Constitutional:  Negative for chills and fever.  Eyes:  Negative for visual disturbance.  Respiratory:  Negative for chest tightness and shortness of breath.   Cardiovascular:  Negative for chest pain and leg swelling.  Genitourinary:  Negative for difficulty urinating and dysuria.  Musculoskeletal:  Negative for back pain and gait problem.  Skin:  Negative for rash.  Neurological:  Negative for light-headedness  and headaches.  Psychiatric/Behavioral:  Negative for agitation, behavioral problems, dysphoric mood, self-injury, sleep disturbance and suicidal ideas. The patient is nervous/anxious.   All other systems reviewed and are negative.  Per HPI unless specifically indicated above   Allergies as of 02/23/2021       Reactions   Nitrofurantoin    Pt does not remember this medicine   Lisinopril Cough   Sulfonamide Derivatives Rash        Medication List        Accurate as of February 23, 2021 11:59 PM. If you have any questions, ask your nurse or doctor.          STOP taking these medications    sertraline 50 MG tablet Commonly known as: Zoloft Stopped by: Worthy Rancher, MD       TAKE these medications    acetaminophen 500 MG tablet Commonly known as: TYLENOL Take 500 mg by mouth every 6 (six) hours as needed for headache.   amLODipine 5 MG tablet Commonly known as: NORVASC Take 1 tablet (5 mg total) by mouth daily.   Benefiber Powd Take by mouth. Takes in the morning   cetirizine 10 MG tablet Commonly known as: ZyrTEC  Allergy Take 1 tablet (10 mg total) by mouth daily. As needed   fluticasone 50 MCG/ACT nasal spray Commonly known as: FLONASE 1 spray in both nostrils 2 times daily as needed for allergies or rhinitis.   hydrochlorothiazide 25 MG tablet Commonly known as: HYDRODIURIL Take 1 tablet (25 mg total) by mouth daily.   meclizine 25 MG tablet Commonly known as: ANTIVERT Take 1 tablet (25 mg total) by mouth 3 (three) times daily as needed for dizziness.   polyethylene glycol 17 g packet Commonly known as: MIRALAX / GLYCOLAX Take 17 g by mouth daily.   Vitamin D3 50 MCG (2000 UT) Tabs Take by mouth daily.         Objective:   BP (!) 146/59   Pulse 75   Ht '5\' 7"'$  (1.702 m)   Wt 151 lb (68.5 kg)   SpO2 96%   BMI 23.65 kg/m   Wt Readings from Last 3 Encounters:  02/23/21 151 lb (68.5 kg)  01/10/21 154 lb (69.9 kg)  11/22/20 153 lb  (69.4 kg)    Physical Exam Vitals and nursing note reviewed.  Constitutional:      General: She is not in acute distress.    Appearance: She is well-developed. She is not diaphoretic.  Eyes:     Conjunctiva/sclera: Conjunctivae normal.  Skin:    General: Skin is warm and dry.     Findings: No rash.  Neurological:     Mental Status: She is alert and oriented to person, place, and time.     Coordination: Coordination normal.  Psychiatric:        Mood and Affect: Mood is anxious. Mood is not depressed.        Behavior: Behavior normal.        Thought Content: Thought content does not include suicidal ideation. Thought content does not include suicidal plan.      Assessment & Plan:   Problem List Items Addressed This Visit   None Visit Diagnoses     GAD (generalized anxiety disorder)    -  Primary       Patient does not want to try anything else and feels like she is doing little better on anxiety.  We will readdress in the future Follow up plan: Return if symptoms worsen or fail to improve.  Counseling provided for all of the vaccine components No orders of the defined types were placed in this encounter.   Caryl Pina, MD [facility] 03/05/2021, 11:00 AM

## 2021-02-26 DIAGNOSIS — M85852 Other specified disorders of bone density and structure, left thigh: Secondary | ICD-10-CM | POA: Diagnosis not present

## 2021-02-26 DIAGNOSIS — M85832 Other specified disorders of bone density and structure, left forearm: Secondary | ICD-10-CM | POA: Diagnosis not present

## 2021-03-27 DIAGNOSIS — M79676 Pain in unspecified toe(s): Secondary | ICD-10-CM | POA: Diagnosis not present

## 2021-03-27 DIAGNOSIS — B351 Tinea unguium: Secondary | ICD-10-CM | POA: Diagnosis not present

## 2021-04-10 ENCOUNTER — Ambulatory Visit (INDEPENDENT_AMBULATORY_CARE_PROVIDER_SITE_OTHER): Payer: Medicare Other

## 2021-04-10 VITALS — Ht 67.0 in | Wt 151.0 lb

## 2021-04-10 DIAGNOSIS — Z0000 Encounter for general adult medical examination without abnormal findings: Secondary | ICD-10-CM

## 2021-04-10 NOTE — Progress Notes (Signed)
Subjective:   Michelle [name] is a 87 y.o. female who presents for Medicare Annual (Subsequent) preventive examination.  Virtual Visit via Telephone Note  I connected with  Michelle [name] on 04/10/21 at  9:45 AM EDT by telephone and verified that I am speaking with the correct person using two identifiers.  Location: Patient: Home Provider: [facility] Persons participating in the virtual visit: patient/Nurse Health Advisor   I discussed the limitations, risks, security and privacy concerns of performing an evaluation and management service by telephone and the availability of in person appointments. The patient expressed understanding and agreed to proceed.  Interactive audio and video telecommunications were attempted between this nurse and patient, however failed, due to patient having technical difficulties OR patient did not have access to video capability.  We continued and completed visit with audio only.  Some vital signs may be absent or patient reported.   Michelle [name] E Michelle Marzec, LPN   Review of Systems     Cardiac Risk Factors include: advanced age (>68men, >35 women);hypertension;sedentary lifestyle;dyslipidemia;Other (see comment), Risk factor comments: stenosis of carotid artery, aortic valve disorder     Objective:    Today's Vitals   04/10/21 0933  Weight: 151 lb (68.5 kg)  Height: 5\' 7"  (1.702 m)   Body mass index is 23.65 kg/m.  Advanced Directives 04/10/2021 12/30/2019 12/03/2018  Does Patient Have a Medical Advance Directive? Yes Yes Yes  Type of Paramedic of [place];Living will [place];Living will [place];Living will  Does patient want to make changes to medical advance directive? - No - Patient declined No - Patient declined  Copy of [place] in Chart? No - copy requested No - copy requested No - copy requested    Current Medications (verified) Outpatient Encounter Medications as  of 04/10/2021  Medication Sig   acetaminophen (TYLENOL) 500 MG tablet Take 500 mg by mouth every 6 (six) hours as needed for headache.   amLODipine (NORVASC) 5 MG tablet Take 1 tablet (5 mg total) by mouth daily.   cetirizine (ZYRTEC ALLERGY) 10 MG tablet Take 1 tablet (10 mg total) by mouth daily. As needed   Cholecalciferol (VITAMIN D3) 2000 UNITS TABS Take by mouth daily.   fluticasone (FLONASE) 50 MCG/ACT nasal spray 1 spray in both nostrils 2 times daily as needed for allergies or rhinitis.   hydrochlorothiazide (HYDRODIURIL) 25 MG tablet Take 1 tablet (25 mg total) by mouth daily.   meclizine (ANTIVERT) 25 MG tablet Take 1 tablet (25 mg total) by mouth 3 (three) times daily as needed for dizziness.   polyethylene glycol (MIRALAX / GLYCOLAX) packet Take 17 g by mouth daily.   Wheat Dextrin (BENEFIBER) POWD Take by mouth. Takes in the morning   No facility-administered encounter medications on file as of 04/10/2021.    Allergies (verified) Nitrofurantoin, Lisinopril, and Sulfonamide derivatives   History: Past Medical History:  Diagnosis Date   Aortic stenosis    Mild echo 2014   Diverticulosis    Duodenitis without mention of hemorrhage    Esophagitis, unspecified    IBS (irritable bowel syndrome)    Uterine cancer ([place])    Past Surgical History:  Procedure Laterality Date   FOOT SURGERY     bilateral    VAGINAL HYSTERECTOMY     Family History  Problem Relation Age of Onset   Heart disease Mother 48       CHF   COPD Father  Eczema Father    Colon cancer Other        Per patient unsure of what cancer in family    Social History   Socioeconomic History   Marital status: Married    Spouse name: Michelle [name]   Number of children: 3   Years of education: 9   Highest education level: 9th grade  Occupational History   Occupation: retired  Tobacco Use   Smoking status: Never   Smokeless tobacco: Never  Vaping Use   Vaping Use: Never used  Substance and Sexual Activity    Alcohol use: No   Drug use: No   Sexual activity: Not Currently  Other Topics Concern   Not on file  Social History Narrative   Lives with husband in one level home with a basement.   Social Determinants of Health   Financial Resource Strain: Low Risk    Difficulty of Paying Living Expenses: Not hard at all  Food Insecurity: No Food Insecurity   Worried About Charity fundraiser in the Last Year: Never true   [place] in the Last Year: Never true  Transportation Needs: No Transportation Needs   Lack of Transportation (Medical): No   Lack of Transportation (Non-Medical): No  Physical Activity: Insufficiently Active   Days of Exercise per Week: 7 days   Minutes of Exercise per Session: 10 min  Stress: No Stress Concern Present   Feeling of Stress : Only a little  Social Connections: Moderately Integrated   Frequency of Communication with Friends and Family: More than three times a week   Frequency of Social Gatherings with Friends and Family: More than three times a week   Attends Religious Services: 1 to 4 times per year   Active Member of [employer] or Organizations: No   Attends Music therapist: Never   Marital Status: Married    Tobacco Counseling Counseling given: Not Answered   Clinical Intake:  Pre-visit preparation completed: Yes  Pain : No/denies pain     BMI - recorded: 23.65 Nutritional Status: BMI of 19-24  Normal Nutritional Risks: None Diabetes: No  How often do you need to have someone help you when you read instructions, pamphlets, or other written materials from your doctor or pharmacy?: 1 - Never  Diabetic? No  Interpreter Needed?: No  Information entered by :: Eunique Balik, LPN   Activities of Daily Living In your present state of health, do you have any difficulty performing the following activities: 04/10/2021  Hearing? Y  Comment mild  Vision? N  Difficulty concentrating or making decisions? N  Walking or climbing  stairs? N  Dressing or bathing? N  Doing errands, shopping? Y  Preparing Food and eating ? N  Using the Toilet? N  In the past six months, have you accidently leaked urine? Y  Comment wears pads for protection - heavy pads at night  Do you have problems with loss of bowel control? N  Managing your Medications? N  Managing your Finances? N  Housekeeping or managing your Housekeeping? N  Some recent data might be hidden    Patient Care Team: Dettinger, Fransisca Kaufmann, MD as PCP - General (Family Medicine)  Indicate any recent Medical Services you may have received from other than [facility] providers in the past year (date may be approximate).     Assessment:   This is a routine wellness examination for Michelle [name].  Hearing/Vision screen Hearing Screening - Comments:: C/o mild hearing difficulties - declines  hearing aids Vision Screening - Comments:: C/o mild vision difficulties - is due for cataract surgery - up to date with annual eye exams with Dr Wynetta Emery at [place] issues and exercise activities discussed: Current Exercise Habits: Home exercise routine, Type of exercise: walking, Time (Minutes): 10, Frequency (Times/Week): 7, Weekly Exercise (Minutes/Week): 70, Intensity: Mild, Exercise limited by: cardiac condition(s);neurologic condition(s)   Goals Addressed             This Visit's Progress    DIET - INCREASE WATER INTAKE   On track    Try to drink 6-8 glasses of water daily.     Exercise 3x per week (30 min per time)   On track      Depression Screen PHQ 2/9 Scores 04/10/2021 02/23/2021 01/10/2021 01/10/2020 12/30/2019 12/11/2018 12/03/2018  PHQ - 2 Score 0 0 1 0 0 0 0  PHQ- 9 Score 3 - - - - - -    Fall Risk Fall Risk  04/10/2021 02/23/2021 01/10/2021 01/10/2020 12/30/2019  Falls in the past year? 0 0 0 0 0  Number falls in past yr: 0 - - - -  Injury with Fall? 0 - - - -  Risk for fall due to : Impaired balance/gait - - - -  Follow up Falls prevention  discussed;Education provided - - - -    FALL RISK PREVENTION PERTAINING TO THE HOME:  Any stairs in or around the home? Yes  If so, are there any without handrails? No  Home free of loose throw rugs in walkways, pet beds, electrical cords, etc? Yes  Adequate lighting in your home to reduce risk of falls? Yes   ASSISTIVE DEVICES UTILIZED TO PREVENT FALLS:  Life alert? No  Use of a cane, walker or w/c? Yes  Grab bars in the bathroom? Yes  Shower chair or bench in shower? No  Elevated toilet seat or a handicapped toilet? No   TIMED UP AND GO:  Was the test performed? No . Telephonic visit  Cognitive Function: Normal cognitive status assessed by direct observation by this Nurse Health Advisor. No abnormalities found.      6CIT Screen 12/30/2019 12/03/2018  What Year? 0 points 0 points  What month? 0 points 0 points  What time? 0 points 0 points  Count back from 20 0 points 0 points  Months in reverse 0 points 0 points  Repeat phrase 0 points 0 points  Total Score 0 0    Immunizations Immunization History  Administered Date(s) Administered   Fluad Quad(high Dose 65+) 04/09/2019   Influenza, High Dose Seasonal PF 04/19/2016, 04/30/2017, 04/15/2018   Influenza,inj,Quad PF,6+ Mos 04/26/2013, 06/02/2014, 04/21/2015   Moderna Sars-Covid-2 Vaccination 09/20/2019, 10/18/2019, 06/27/2020   Pneumococcal Conjugate-13 12/01/2014   Pneumococcal Polysaccharide-23 04/29/2016   Tdap 10/15/2011    TDAP status: Up to date  Flu Vaccine status: Due, Education has been provided regarding the importance of this vaccine. Advised may receive this vaccine at local pharmacy or Health Dept. Aware to provide a copy of the vaccination record if obtained from local pharmacy or Health Dept. Verbalized acceptance and understanding.  Pneumococcal vaccine status: Up to date  Covid-19 vaccine status: Completed vaccines  Qualifies for Shingles Vaccine? Yes   Zostavax completed Yes   Shingrix  Completed?: No.    Education has been provided regarding the importance of this vaccine. Patient has been advised to call insurance company to determine out of pocket expense if they have not yet received this  vaccine. Advised may also receive vaccine at local pharmacy or Health Dept. Verbalized acceptance and understanding.  Screening Tests Health Maintenance  Topic Date Due   INFLUENZA VACCINE  02/12/2021   COVID-19 Vaccine (4 - Booster for Moderna series) 07/09/2021 (Originally 10/26/2020)   Zoster Vaccines- Shingrix (1 of 2) 04/08/2022 (Originally [birth year]/04/1986)   TETANUS/TDAP  10/14/2021   DEXA SCAN  Completed   HPV VACCINES  Aged Out   MAMMOGRAM  Discontinued    Health Maintenance  Health Maintenance Due  Topic Date Due   INFLUENZA VACCINE  02/12/2021    Colorectal cancer screening: No longer required.   Mammogram status: No longer required due to age.  Bone Density status: Completed 02/26/2021. Results reflect: Bone density results: OSTEOPENIA. Repeat every 2 years.  Lung Cancer Screening: (Low Dose CT Chest recommended if Age 79-80 years, 30 pack-year currently smoking OR have quit w/in 15years.) does not qualify.   Additional Screening:  Hepatitis C Screening: does not qualify  Vision Screening: Recommended annual ophthalmology exams for early detection of glaucoma and other disorders of the eye. Is the patient up to date with their annual eye exam?  Yes  Who is the provider or what is the name of the office in which the patient attends annual eye exams? [place] If pt is not established with a provider, would they like to be referred to a provider to establish care? No .   Dental Screening: Recommended annual dental exams for proper oral hygiene  Community Resource Referral / Chronic Care Management: CRR required this visit?  No   CCM required this visit?  No      Plan:     I have personally reviewed and noted the following in the patient's chart:    Medical and social history Use of alcohol, tobacco or illicit drugs  Current medications and supplements including opioid prescriptions.  Functional ability and status Nutritional status Physical activity Advanced directives List of other physicians Hospitalizations, surgeries, and ER visits in previous 12 months Vitals Screenings to include cognitive, depression, and falls Referrals and appointments  In addition, I have reviewed and discussed with patient certain preventive protocols, quality metrics, and best practice recommendations. A written personalized care plan for preventive services as well as general preventive health recommendations were provided to patient.     Sandrea Hammond, LPN   [id number]/98/[id number]   Nurse Notes: None

## 2021-04-10 NOTE — Patient Instructions (Signed)
Michelle [name] , Thank you for taking time to come for your Medicare Wellness Visit. I appreciate your ongoing commitment to your health goals. Please review the following plan we discussed and let me know if I can assist you in the future.   Screening recommendations/referrals: Colonoscopy: No longer required Mammogram: no longer required Bone Density: Done 02/26/2021 - Repeat every 2 years Recommended yearly ophthalmology/optometry visit for glaucoma screening and checkup Recommended yearly dental visit for hygiene and checkup  Vaccinations: Influenza vaccine: Done 04/09/2019 - Repeat annually *due Pneumococcal vaccine: Done 12/01/2014 & 04/29/2016 Tdap vaccine: Done 10/15/2011 - Repeat in 10 years Shingles vaccine: Due. Shingrix discussed. Please contact your pharmacy for coverage information.     Covid-19: Done 09/20/2019, 10/18/2019, 06/27/2020 & 02/06/2021  Advanced directives: Please bring a copy of your health care power of attorney and living will to the office to be added to your chart at your convenience.   Conditions/risks identified: Aim for 30 minutes of exercise or brisk walking each day, drink 6-8 glasses of water and eat lots of fruits and vegetables.   Next appointment: Follow up in one year for your annual wellness visit    Preventive Care 65 Years and Older, Female Preventive care refers to lifestyle choices and visits with your health care provider that can promote health and wellness. What does preventive care include? A yearly physical exam. This is also called an annual well check. Dental exams once or twice a year. Routine eye exams. Ask your health care provider how often you should have your eyes checked. Personal lifestyle choices, including: Daily care of your teeth and gums. Regular physical activity. Eating a healthy diet. Avoiding tobacco and drug use. Limiting alcohol use. Practicing safe sex. Taking low-dose aspirin every day. Taking vitamin and mineral  supplements as recommended by your health care provider. What happens during an annual well check? The services and screenings done by your health care provider during your annual well check will depend on your age, overall health, lifestyle risk factors, and family history of disease. Counseling  Your health care provider may ask you questions about your: Alcohol use. Tobacco use. Drug use. Emotional well-being. Home and relationship well-being. Sexual activity. Eating habits. History of falls. Memory and ability to understand (cognition). Work and work Statistician. Reproductive health. Screening  You may have the following tests or measurements: Height, weight, and BMI. Blood pressure. Lipid and cholesterol levels. These may be checked every 5 years, or more frequently if you are over 49 years old. Skin check. Lung cancer screening. You may have this screening every year starting at age 71 if you have a 30-pack-year history of smoking and currently smoke or have quit within the past 15 years. Fecal occult blood test (FOBT) of the stool. You may have this test every year starting at age 42. Flexible sigmoidoscopy or colonoscopy. You may have a sigmoidoscopy every 5 years or a colonoscopy every 10 years starting at age 30. Hepatitis C blood test. Hepatitis B blood test. Sexually transmitted disease (STD) testing. Diabetes screening. This is done by checking your blood sugar (glucose) after you have not eaten for a while (fasting). You may have this done every 1-3 years. Bone density scan. This is done to screen for osteoporosis. You may have this done starting at age 22. Mammogram. This may be done every 1-2 years. Talk to your health care provider about how often you should have regular mammograms. Talk with your health care provider about your test results,  treatment options, and if necessary, the need for more tests. Vaccines  Your health care provider may recommend certain  vaccines, such as: Influenza vaccine. This is recommended every year. Tetanus, diphtheria, and acellular pertussis (Tdap, Td) vaccine. You may need a Td booster every 10 years. Zoster vaccine. You may need this after age 55. Pneumococcal 13-valent conjugate (PCV13) vaccine. One dose is recommended after age 2. Pneumococcal polysaccharide (PPSV23) vaccine. One dose is recommended after age 46. Talk to your health care provider about which screenings and vaccines you need and how often you need them. This information is not intended to replace advice given to you by your health care provider. Make sure you discuss any questions you have with your health care provider. Document Released: 07/28/2015 Document Revised: 03/20/2016 Document Reviewed: 05/02/2015 Elsevier Interactive Patient Education  2017 [place] Prevention in the Home Falls can cause injuries. They can happen to people of all ages. There are many things you can do to make your home safe and to help prevent falls. What can I do on the outside of my home? Regularly fix the edges of walkways and driveways and fix any cracks. Remove anything that might make you trip as you walk through a door, such as a raised step or threshold. Trim any bushes or trees on the path to your home. Use bright outdoor lighting. Clear any walking paths of anything that might make someone trip, such as rocks or tools. Regularly check to see if handrails are loose or broken. Make sure that both sides of any steps have handrails. Any raised decks and porches should have guardrails on the edges. Have any leaves, snow, or ice cleared regularly. Use sand or salt on walking paths during winter. Clean up any spills in your garage right away. This includes oil or grease spills. What can I do in the bathroom? Use night lights. Install grab bars by the toilet and in the tub and shower. Do not use towel bars as grab bars. Use non-skid mats or decals in  the tub or shower. If you need to sit down in the shower, use a plastic, non-slip stool. Keep the floor dry. Clean up any water that spills on the floor as soon as it happens. Remove soap buildup in the tub or shower regularly. Attach bath mats securely with double-sided non-slip rug tape. Do not have throw rugs and other things on the floor that can make you trip. What can I do in the bedroom? Use night lights. Make sure that you have a light by your bed that is easy to reach. Do not use any sheets or blankets that are too big for your bed. They should not hang down onto the floor. Have a firm chair that has side arms. You can use this for support while you get dressed. Do not have throw rugs and other things on the floor that can make you trip. What can I do in the kitchen? Clean up any spills right away. Avoid walking on wet floors. Keep items that you use a lot in easy-to-reach places. If you need to reach something above you, use a strong step stool that has a grab bar. Keep electrical cords out of the way. Do not use floor polish or wax that makes floors slippery. If you must use wax, use non-skid floor wax. Do not have throw rugs and other things on the floor that can make you trip. What can I do with my stairs? Do  not leave any items on the stairs. Make sure that there are handrails on both sides of the stairs and use them. Fix handrails that are broken or loose. Make sure that handrails are as long as the stairways. Check any carpeting to make sure that it is firmly attached to the stairs. Fix any carpet that is loose or worn. Avoid having throw rugs at the top or bottom of the stairs. If you do have throw rugs, attach them to the floor with carpet tape. Make sure that you have a light switch at the top of the stairs and the bottom of the stairs. If you do not have them, ask someone to add them for you. What else can I do to help prevent falls? Wear shoes that: Do not have high  heels. Have rubber bottoms. Are comfortable and fit you well. Are closed at the toe. Do not wear sandals. If you use a stepladder: Make sure that it is fully opened. Do not climb a closed stepladder. Make sure that both sides of the stepladder are locked into place. Ask someone to hold it for you, if possible. Clearly mark and make sure that you can see: Any grab bars or handrails. First and last steps. Where the edge of each step is. Use tools that help you move around (mobility aids) if they are needed. These include: Canes. Walkers. Scooters. Crutches. Turn on the lights when you go into a dark area. Replace any light bulbs as soon as they burn out. Set up your furniture so you have a clear path. Avoid moving your furniture around. If any of your floors are uneven, fix them. If there are any pets around you, be aware of where they are. Review your medicines with your doctor. Some medicines can make you feel dizzy. This can increase your chance of falling. Ask your doctor what other things that you can do to help prevent falls. This information is not intended to replace advice given to you by your health care provider. Make sure you discuss any questions you have with your health care provider. Document Released: 04/27/2009 Document Revised: 12/07/2015 Document Reviewed: 08/05/2014 Elsevier Interactive Patient Education  2017 [employer].

## 2021-04-11 ENCOUNTER — Ambulatory Visit (INDEPENDENT_AMBULATORY_CARE_PROVIDER_SITE_OTHER): Payer: Medicare Other

## 2021-04-11 DIAGNOSIS — Z23 Encounter for immunization: Secondary | ICD-10-CM | POA: Diagnosis not present

## 2021-04-11 NOTE — Progress Notes (Signed)
Flu vaccine given in right deltoid without difficulty.

## 2021-04-16 DIAGNOSIS — H40013 Open angle with borderline findings, low risk, bilateral: Secondary | ICD-10-CM | POA: Diagnosis not present

## 2021-04-16 DIAGNOSIS — H01002 Unspecified blepharitis right lower eyelid: Secondary | ICD-10-CM | POA: Diagnosis not present

## 2021-04-16 DIAGNOSIS — H01001 Unspecified blepharitis right upper eyelid: Secondary | ICD-10-CM | POA: Diagnosis not present

## 2021-04-16 DIAGNOSIS — H01004 Unspecified blepharitis left upper eyelid: Secondary | ICD-10-CM | POA: Diagnosis not present

## 2021-04-16 DIAGNOSIS — H2513 Age-related nuclear cataract, bilateral: Secondary | ICD-10-CM | POA: Diagnosis not present

## 2021-05-24 DIAGNOSIS — H2511 Age-related nuclear cataract, right eye: Secondary | ICD-10-CM | POA: Diagnosis not present

## 2021-05-28 ENCOUNTER — Other Ambulatory Visit: Payer: Self-pay

## 2021-05-28 ENCOUNTER — Encounter (HOSPITAL_COMMUNITY)
Admission: RE | Admit: 2021-05-28 | Discharge: 2021-05-28 | Disposition: A | Discharge status: Home / Self Care | Payer: Medicare Other | Source: Ambulatory Visit | Attending: Ophthalmology | Admitting: Ophthalmology

## 2021-05-28 HISTORY — DX: Gastro-esophageal reflux disease without esophagitis: K21.9

## 2021-05-28 HISTORY — DX: Cardiac murmur, unspecified: R01.1

## 2021-05-28 HISTORY — DX: Essential (primary) hypertension: I10

## 2021-05-29 NOTE — H&P (Signed)
Surgical History & Physical  Patient Name: Michelle [name] DOB: April 23, 1934  Surgery: Cataract extraction with intraocular lens implant phacoemulsification; Right Eye  Surgeon: Baruch Goldmann MD Surgery Date:  06-01-21 Pre-Op Date: 05-24-21  HPI: A 32 Yr. old female patient is referred by DR Wynetta Emery for cataract eval. 1. 1. The patient complains of difficulty when viewing TV, reading closed caption, news scrolls on TV, which began 2 months ago. Both eyes are affected. The episode is gradual. The condition's severity increased since last visit. Symptoms occur when the patient is inside and outside. This is negatively affecting the patient's quality of life. HPI Completed by Dr. Baruch Goldmann  Medical History: Cataracts Arthritis Heart Problem High Blood Pressure LDL  Review of Systems Allergic/Immunologic Seasonal Allergies All recorded systems are negative except as noted above.  Social   Never smoked   Medication Artificial tears, preservative free, Flonase, Meclizine, Tylenol, Zyrtec, HCTZ, Amlodipine besylate, Vitamin D3, Miralax,   Sx/Procedures Hysterectomy, Foot bunionectomy,   Drug Allergies  Sulfa (sulfonamide antibiotics), Sulfur,   History & Physical: Heent: Cataract, Right EYe NECK: supple without bruits LUNGS: lungs clear to auscultation CV: regular rate and rhythm Abdomen: soft and non-tender  Impression & Plan: Assessment: 1.  NUCLEAR SCLEROSIS AGE RELATED; Both Eyes (H25.13) 2.  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  BLEPHARITIS; Right Upper Lid, Right Lower Lid, Left Upper Lid, Left Lower Lid (H01.001, H01.002,H01.004,H01.005) 4.  Pinguecula; Right Eye (H11.151) 5.  OAG BORDERLINE FINDINGS LOW RISK; Both Eyes (H40.013)  Plan: 1.  Cataract accounts for the patient's decreased vision. This visual impairment is not correctable with a tolerable change in glasses or contact lenses. Cataract surgery with an implantation of a  new lens should significantly improve the visual and functional status of the patient. Discussed all risks, benefits, alternatives, and potential complications. Discussed the procedures and recovery. Patient desires to have surgery. A-scan ordered and performed today for intra-ocular lens calculations. The surgery will be performed in order to improve vision for driving, reading, and for eye examinations. Recommend phacoemulsification with intra-ocular lens. Recommend Dextenza for post-operative pain and inflammation. Right Eye worse - first. Dilates well - shugarcaine by protocol. Fuchs - Viscoat (DuoVisc)  2.  Stable- Monitor No New diagnosis for patient. May require transplant in future.  3.  Recommend regular lid cleaning.  4.  Observe; Artificial tears as needed for irritation.  5.  Based on cup-to-disc ratio. Negative Family history. OCT rNFL shows: WNl OS, unreliable OD. Detailed discussion about glaucoma today including importance of maintaining good follow up and following treatment plan, and the possibility of irreversible blindness as part of this disease process. Sincerely,

## 2021-05-30 ENCOUNTER — Encounter (HOSPITAL_COMMUNITY): Payer: Self-pay

## 2021-06-01 ENCOUNTER — Ambulatory Visit (HOSPITAL_COMMUNITY): Payer: Medicare Other | Admitting: Certified Registered"

## 2021-06-01 ENCOUNTER — Encounter (HOSPITAL_COMMUNITY)
Admission: RE | Disposition: A | Discharge status: Home / Self Care | Payer: Self-pay | Source: Home / Self Care | Attending: Ophthalmology

## 2021-06-01 ENCOUNTER — Ambulatory Visit (HOSPITAL_COMMUNITY)
Admission: RE | Admit: 2021-06-01 | Discharge: 2021-06-01 | Disposition: A | Discharge status: Home / Self Care | Payer: Medicare Other | Attending: Ophthalmology | Admitting: Ophthalmology

## 2021-06-01 ENCOUNTER — Encounter (HOSPITAL_COMMUNITY): Payer: Self-pay | Admitting: Ophthalmology

## 2021-06-01 ENCOUNTER — Other Ambulatory Visit: Payer: Self-pay

## 2021-06-01 DIAGNOSIS — H2511 Age-related nuclear cataract, right eye: Secondary | ICD-10-CM | POA: Insufficient documentation

## 2021-06-01 DIAGNOSIS — I10 Essential (primary) hypertension: Secondary | ICD-10-CM | POA: Insufficient documentation

## 2021-06-01 DIAGNOSIS — I350 Nonrheumatic aortic (valve) stenosis: Secondary | ICD-10-CM | POA: Diagnosis not present

## 2021-06-01 HISTORY — PX: CATARACT EXTRACTION W/PHACO: SHX586

## 2021-06-01 SURGERY — PHACOEMULSIFICATION, CATARACT, WITH IOL INSERTION
Anesthesia: Monitor Anesthesia Care | Site: Eye | Laterality: Right

## 2021-06-01 MED ORDER — SODIUM HYALURONATE 23MG/ML IO SOSY
PREFILLED_SYRINGE | INTRAOCULAR | Status: DC | PRN
  Administered 2021-06-01: 0.6 mL via INTRAOCULAR

## 2021-06-01 MED ORDER — MIDAZOLAM HCL 2 MG/2ML IJ SOLN
INTRAMUSCULAR | Status: AC
  Filled 2021-06-01: qty 2

## 2021-06-01 MED ORDER — LIDOCAINE HCL 3.5 % OP GEL
1.0000 "application " | Freq: Once | OPHTHALMIC | Status: AC
  Administered 2021-06-01: 1 via OPHTHALMIC

## 2021-06-01 MED ORDER — PHENYLEPHRINE HCL 2.5 % OP SOLN
1.0000 [drp] | OPHTHALMIC | Status: AC | PRN
  Administered 2021-06-01 (×3): 1 [drp] via OPHTHALMIC

## 2021-06-01 MED ORDER — STERILE WATER FOR IRRIGATION IR SOLN
Status: DC | PRN
  Administered 2021-06-01: 250 mL

## 2021-06-01 MED ORDER — BSS IO SOLN
INTRAOCULAR | Status: DC | PRN
  Administered 2021-06-01: 15 mL via INTRAOCULAR

## 2021-06-01 MED ORDER — LIDOCAINE HCL (PF) 1 % IJ SOLN
INTRAOCULAR | Status: DC | PRN
  Administered 2021-06-01: 1 mL via OPHTHALMIC

## 2021-06-01 MED ORDER — EPINEPHRINE PF 1 MG/ML IJ SOLN
INTRAMUSCULAR | Status: AC
  Filled 2021-06-01: qty 2

## 2021-06-01 MED ORDER — SIGHTPATH DOSE#1 NA CHONDROIT SULF-NA HYALURON 20-15 MG/0.5ML IO SOSY
INTRAOCULAR | Status: DC | PRN
  Administered 2021-06-01: .5 mL via INTRAOCULAR

## 2021-06-01 MED ORDER — EPINEPHRINE PF 1 MG/ML IJ SOLN
INTRAOCULAR | Status: DC | PRN
  Administered 2021-06-01: 500 mL

## 2021-06-01 MED ORDER — SODIUM HYALURONATE 10 MG/ML IO SOLUTION
PREFILLED_SYRINGE | INTRAOCULAR | Status: DC | PRN
  Administered 2021-06-01: 0.55 mL via INTRAOCULAR
  Administered 2021-06-01: 0.85 mL via INTRAOCULAR

## 2021-06-01 MED ORDER — TETRACAINE HCL 0.5 % OP SOLN
1.0000 [drp] | OPHTHALMIC | Status: AC | PRN
  Administered 2021-06-01 (×3): 1 [drp] via OPHTHALMIC

## 2021-06-01 MED ORDER — TROPICAMIDE 1 % OP SOLN
1.0000 [drp] | OPHTHALMIC | Status: AC | PRN
  Administered 2021-06-01 (×3): 1 [drp] via OPHTHALMIC

## 2021-06-01 MED ORDER — SODIUM CHLORIDE (PF) 0.9 % IJ SOLN
INTRAMUSCULAR | Status: AC
  Filled 2021-06-01: qty 10

## 2021-06-01 MED ORDER — POVIDONE-IODINE 5 % OP SOLN
OPHTHALMIC | Status: DC | PRN
  Administered 2021-06-01: 1 via OPHTHALMIC

## 2021-06-01 SURGICAL SUPPLY — 12 items
CLOTH BEACON ORANGE TIMEOUT ST (SAFETY) ×1 IMPLANT
EYE SHIELD UNIVERSAL CLEAR (GAUZE/BANDAGES/DRESSINGS) ×1 IMPLANT
GLOVE SURG UNDER POLY LF SZ7 (GLOVE) ×2 IMPLANT
NDL HYPO 18GX1.5 BLUNT FILL (NEEDLE) IMPLANT
NEEDLE HYPO 18GX1.5 BLUNT FILL (NEEDLE) ×2 IMPLANT
PAD ARMBOARD 7.5X6 YLW CONV (MISCELLANEOUS) ×1 IMPLANT
RayOne EMV US (Intraocular Lens) ×1 IMPLANT
SYR TB 1ML LL NO SAFETY (SYRINGE) ×1 IMPLANT
TAPE SURG TRANSPORE 1 IN (GAUZE/BANDAGES/DRESSINGS) IMPLANT
TAPE SURGICAL TRANSPORE 1 IN (GAUZE/BANDAGES/DRESSINGS) ×2
VISCOELASTIC ADDITIONAL (OPHTHALMIC RELATED) IMPLANT
WATER STERILE IRR 250ML POUR (IV SOLUTION) ×1 IMPLANT

## 2021-06-01 NOTE — Anesthesia Postprocedure Evaluation (Signed)
Anesthesia Post Note  Patient: Michelle [name]  Procedure(s) Performed: CATARACT EXTRACTION PHACO AND INTRAOCULAR LENS PLACEMENT (IOC) (Right: Eye)  Patient location during evaluation: Phase II Anesthesia Type: MAC Level of consciousness: awake Pain management: pain level controlled Vital Signs Assessment: post-procedure vital signs reviewed and stable Respiratory status: spontaneous breathing and respiratory function stable Cardiovascular status: blood pressure returned to baseline and stable Postop Assessment: no headache and no apparent nausea or vomiting Anesthetic complications: no Comments: Late entry   No notable events documented.   Last Vitals:  Vitals:   06/01/21 0745 06/01/21 0851  BP: 138/62 125/64  Pulse: 81 77  Resp: 16 18  Temp: 36.7 C 36.6 C  SpO2: 98% 100%    Last Pain:  Vitals:   06/01/21 0851  TempSrc: Oral  PainSc: 0-No pain                 Louann Sjogren

## 2021-06-01 NOTE — Interval H&P Note (Signed)
History and Physical Interval Note:  06/01/2021 8:22 AM  Michelle [name]  has presented today for surgery, with the diagnosis of nuclear cataract right eye.  The various methods of treatment have been discussed with the patient and family. After consideration of risks, benefits and other options for treatment, the patient has consented to  Procedure(s) with comments: CATARACT EXTRACTION PHACO AND INTRAOCULAR LENS PLACEMENT (IOC) (Right) - right as a surgical intervention.  The patient's history has been reviewed, patient examined, no change in status, stable for surgery.  I have reviewed the patient's chart and labs.  Questions were answered to the patient's satisfaction.     Baruch Goldmann

## 2021-06-01 NOTE — Transfer of Care (Signed)
Immediate Anesthesia Transfer of Care Note  Patient: Michelle [name]  Procedure(s) Performed: CATARACT EXTRACTION PHACO AND INTRAOCULAR LENS PLACEMENT (IOC) (Right: Eye)  Patient Location: Short Stay  Anesthesia Type:MAC  Level of Consciousness: awake, alert  and oriented  Airway & Oxygen Therapy: Patient Spontanous Breathing  Post-op Assessment: Report given to RN and Post -op Vital signs reviewed and stable  Post vital signs: Reviewed and stable  Last Vitals:  Vitals Value Taken Time  BP    Temp    Pulse    Resp    SpO2      Last Pain:  Vitals:   06/01/21 0745  TempSrc: Oral  PainSc: 0-No pain         Complications: No notable events documented.

## 2021-06-01 NOTE — Anesthesia Preprocedure Evaluation (Signed)
Anesthesia Evaluation  Patient identified by MRN, date of birth, ID band Patient awake    Reviewed: Allergy & Precautions, H&P , NPO status , Patient's Chart, lab work & pertinent test results, reviewed documented beta blocker date and time   Airway Mallampati: II  TM Distance: >3 FB Neck ROM: full    Dental no notable dental hx. (+) Upper Dentures, Lower Dentures   Pulmonary neg pulmonary ROS,    Pulmonary exam normal breath sounds clear to auscultation       Cardiovascular Exercise Tolerance: Good hypertension, + Valvular Problems/Murmurs  Rhythm:regular Rate:Normal     Neuro/Psych negative neurological ROS  negative psych ROS   GI/Hepatic Neg liver ROS, GERD  Medicated,  Endo/Other  negative endocrine ROS  Renal/GU negative Renal ROS  negative genitourinary   Musculoskeletal   Abdominal   Peds  Hematology negative hematology ROS (+)   Anesthesia Other Findings   Reproductive/Obstetrics negative OB ROS                             Anesthesia Physical Anesthesia Plan  ASA: 2  Anesthesia Plan: MAC   Post-op Pain Management:    Induction:   PONV Risk Score and Plan:   Airway Management Planned:   Additional Equipment:   Intra-op Plan:   Post-operative Plan:   Informed Consent: I have reviewed the patients History and Physical, chart, labs and discussed the procedure including the risks, benefits and alternatives for the proposed anesthesia with the patient or authorized representative who has indicated his/her understanding and acceptance.     Dental Advisory Given  Plan Discussed with: CRNA  Anesthesia Plan Comments:         Anesthesia Quick Evaluation

## 2021-06-01 NOTE — Anesthesia Procedure Notes (Signed)
Procedure Name: MAC Date/Time: 06/01/2021 8:32 AM Performed by: Orlie Dakin, CRNA Pre-anesthesia Checklist: Patient identified, Emergency Drugs available, Suction available and Patient being monitored Patient Re-evaluated:Patient Re-evaluated prior to induction Oxygen Delivery Method: Nasal cannula Placement Confirmation: positive ETCO2

## 2021-06-01 NOTE — Op Note (Addendum)
Date of procedure: 06/01/21  Pre-operative diagnosis: Visually significant age-related nuclear cataract, Right Eye (H25.11)  Post-operative diagnosis: Visually significant age-related nuclear cataract, Right Eye  Procedure: Removal of cataract via phacoemulsification and insertion of intra-ocular lens Rayner [id number] +23.0D into the capsular bag of the Right Eye  Attending surgeon: Gerda Diss. Haiden Rawlinson, MD, MA  Anesthesia: MAC, Topical Akten  Complications: None  Estimated Blood Loss: <40m (minimal)  Specimens: None  Implants: As above  Indications:  Visually significant age-related cataract, Right Eye  Procedure:  The patient was seen and identified in the pre-operative area. The operative eye was identified and dilated.  The operative eye was marked.  Topical anesthesia was administered to the operative eye.     The patient was then to the operative suite and placed in the supine position.  A timeout was performed confirming the patient, procedure to be performed, and all other relevant information.   The patient's face was prepped and draped in the usual fashion for intra-ocular surgery.  A lid speculum was placed into the operative eye and the surgical microscope moved into place and focused.  A superotemporal paracentesis was created using a 20 gauge paracentesis blade.  Shugarcaine was injected into the anterior chamber.  Viscoelastic was injected into the anterior chamber.  A temporal clear-corneal main wound incision was created using a 2.461mmicrokeratome.  A continuous curvilinear capsulorrhexis was initiated using an irrigating cystitome and completed using capsulorrhexis forceps.  Hydrodissection and hydrodeliniation were performed.  Viscoelastic was injected into the anterior chamber.  A phacoemulsification handpiece and a chopper as a second instrument were used to remove the nucleus and epinucleus. The irrigation/aspiration handpiece was used to remove any remaining cortical  material.   The capsular bag was reinflated with viscoelastic, checked, and found to be intact.  The intraocular lens was inserted into the capsular bag.  The irrigation/aspiration handpiece was used to remove any remaining viscoelastic.  The clear corneal wound and paracentesis wounds were then hydrated and checked with Weck-Cels to be watertight.  The lid-speculum and drape was removed, and the patient's face was cleaned with a wet and dry 4x4. A clear shield was taped over the eye. The patient was taken to the post-operative care unit in good condition, having tolerated the procedure well.  Post-Op Instructions: [facility] and will receive all other orders and instructions.

## 2021-06-01 NOTE — Discharge Instructions (Signed)
Please discharge patient when stable, will follow up today with Dr. Jase Reep at the [facility] [place] office immediately following discharge.  Leave shield in place until visit.  All paperwork with discharge instructions will be given at the office.  [facility] Address:  [address]  [place], [place] [address]  

## 2021-06-04 ENCOUNTER — Encounter (HOSPITAL_COMMUNITY): Payer: Self-pay | Admitting: Ophthalmology

## 2021-06-05 DIAGNOSIS — B351 Tinea unguium: Secondary | ICD-10-CM | POA: Diagnosis not present

## 2021-06-05 DIAGNOSIS — M79676 Pain in unspecified toe(s): Secondary | ICD-10-CM | POA: Diagnosis not present

## 2021-06-12 ENCOUNTER — Encounter (HOSPITAL_COMMUNITY)
Admission: RE | Admit: 2021-06-12 | Discharge: 2021-06-12 | Disposition: A | Discharge status: Home / Self Care | Payer: Medicare Other | Source: Ambulatory Visit | Attending: Ophthalmology | Admitting: Ophthalmology

## 2021-06-12 ENCOUNTER — Ambulatory Visit (INDEPENDENT_AMBULATORY_CARE_PROVIDER_SITE_OTHER): Payer: Medicare Other | Admitting: Nurse Practitioner

## 2021-06-12 ENCOUNTER — Other Ambulatory Visit: Payer: Self-pay

## 2021-06-12 ENCOUNTER — Encounter: Payer: Self-pay | Admitting: Nurse Practitioner

## 2021-06-12 DIAGNOSIS — J101 Influenza due to other identified influenza virus with other respiratory manifestations: Secondary | ICD-10-CM

## 2021-06-12 DIAGNOSIS — J988 Other specified respiratory disorders: Secondary | ICD-10-CM

## 2021-06-12 LAB — VERITOR FLU A/B WAIVED
Influenza A: POSITIVE — AB
Influenza B: NEGATIVE

## 2021-06-12 MED ORDER — OSELTAMIVIR PHOSPHATE 75 MG PO CAPS: 75.0000 mg | ORAL_CAPSULE | Freq: Two times a day (BID) | ORAL | 0 refills | Status: DC

## 2021-06-12 NOTE — Progress Notes (Signed)
Virtual Visit  Note Due to COVID-19 pandemic this visit was conducted virtually. This visit type was conducted due to national recommendations for restrictions regarding the COVID-19 Pandemic (e.g. social distancing, sheltering in place) in an effort to limit this patient's exposure and mitigate transmission in our community. All issues noted in this document were discussed and addressed.  A physical exam was not performed with this format.  I connected with Michelle [name] on 06/12/21 at 1:27 by telephone and verified that I am speaking with the correct person using two identifiers. Michelle [name] is currently located at home and her husband is currently with her during visit. The provider, Mary-Margaret Hassell Done, FNP is located in their office at time of visit.  I discussed the limitations, risks, security and privacy concerns of performing an evaluation and management service by telephone and the availability of in person appointments. I also discussed with the patient that there may be a patient responsible charge related to this service. The patient expressed understanding and agreed to proceed.   History and Present Illness:  Patient states that she has cough and congestion. Started last night. She has not taken anything OTC.   Daughter wants tested for flu- she says that Michelle [name] called her at 4am in morning saying she was really sick. But patient tells me she just has a head cold.   Review of Systems  Constitutional:  Negative for chills and fever.  HENT:  Positive for congestion. Negative for sore throat.   Respiratory:  Positive for cough. Negative for sputum production and shortness of breath.   Musculoskeletal:  Negative for myalgias.  Neurological:  Negative for headaches.  All other systems reviewed and are negative.   Observations/Objective: Alert and oriented- answers all questions appropriately No distress Voice hoarse Cough during visit  Assessment and Plan: Michelle [name] in today with chief complaint of No chief complaint on file.   1. Congestion of upper airway - Veritor Flu A/B Waived  2. Influenza A 1. Take meds as prescribed 2. Use a cool mist humidifier especially during the winter months and when heat has been humid. 3. Use saline nose sprays frequently 4. Saline irrigations of the nose can be very helpful if done frequently.  * 4X daily for 1 week*  * Use of a nettie pot can be helpful with this. Follow directions with this* 5. Drink plenty of fluids 6. Keep thermostat turn down low 7.For any cough or congestion  Use plain Mucinex- regular strength or max strength is fine   * Children- consult with Pharmacist for dosing 8. For fever or aces or pains- take tylenol or ibuprofen appropriate for age and weight.  * for fevers greater than 101 orally you may alternate ibuprofen and tylenol every  3 hours.    Meds ordered this encounter  Medications   oseltamivir (TAMIFLU) 75 MG capsule    Sig: Take 1 capsule (75 mg total) by mouth 2 (two) times daily.    Dispense:  10 capsule    Refill:  0    Order Specific Question:   Supervising Provider    Answer:   Caryl Pina A [[id number]]      Follow Up Instructions: prn    I discussed the assessment and treatment plan with the patient. The patient was provided an opportunity to ask questions and all were answered. The patient agreed with the plan and demonstrated an understanding of the instructions.   The patient was advised to call  back or seek an in-person evaluation if the symptoms worsen or if the condition fails to improve as anticipated.  The above assessment and management plan was discussed with the patient. The patient verbalized understanding of and has agreed to the management plan. Patient is aware to call the clinic if symptoms persist or worsen. Patient is aware when to return to the clinic for a follow-up visit. Patient educated on when it is appropriate to go to the  emergency department.   Time call ended:  2:34- waited on flu test results  I provided 15 minutes of  non face-to-face time during this encounter.    Mary-Margaret Hassell Done, FNP

## 2021-07-12 ENCOUNTER — Other Ambulatory Visit: Payer: Self-pay

## 2021-07-12 ENCOUNTER — Encounter (HOSPITAL_COMMUNITY): Payer: Self-pay

## 2021-07-12 ENCOUNTER — Encounter (HOSPITAL_COMMUNITY)
Admission: RE | Admit: 2021-07-12 | Discharge: 2021-07-12 | Disposition: A | Discharge status: Home / Self Care | Payer: Medicare Other | Source: Ambulatory Visit | Attending: Ophthalmology | Admitting: Ophthalmology

## 2021-07-12 DIAGNOSIS — H2512 Age-related nuclear cataract, left eye: Secondary | ICD-10-CM | POA: Diagnosis not present

## 2021-07-19 NOTE — H&P (Signed)
Surgical History & Physical  Patient Name: Michelle [name] DOB: [birth year]  Surgery: Cataract extraction with intraocular lens implant phacoemulsification; Left Eye  Surgeon: Baruch Goldmann MD Surgery Date:  07-20-21 Pre-Op Date:  07-12-21  HPI: A 44 Yr. old female patient The patient is returning after cataract surgery. The right eye is affected. Status post cataract surgery, which began 4 days ago: Since the last visit, the affected area is doing well. Pt states she can see a "beam of light" in central vision since sx. The patient's vision is improved and stable but noticing film sensation lasting throughout the day. Patient is following medication instructions. Pt taking PO combo TID OD. Pt denies any increase in floaters/flashes of light. HPI was performed by Baruch Goldmann .  Medical History: Cataracts Arthritis Heart Problem High Blood Pressure LDL  Review of Systems Allergic/Immunologic Seasonal Allergies All recorded systems are negative except as noted above.  Social   Never smoked   Medication Artificial tears, preservative free, Prednisolone-Moxifloxacin-Bromfenac,  Flonase, Meclizine, Tylenol, Zyrtec, HCTZ, Amlodipine besylate, Vitamin D3, Miralax,   Sx/Procedures Phaco c IOL OD, Hysterectomy, Foot bunionectomy,   Drug Allergies  Sulfa (sulfonamide antibiotics), Sulfur,   History & Physical: Heent: Cataract, Left Eye NECK: supple without bruits LUNGS: lungs clear to auscultation CV: regular rate and rhythm Abdomen: soft and non-tender  Impression & Plan: Assessment: 1.  CATARACT EXTRACTION STATUS; Right Eye (Z98.41) 2.  NUCLEAR SCLEROSIS AGE RELATED; Left Eye (H25.12) 3.  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  1 week after cataract surgery. Doing well with improved vision and normal eye pressure. Call with any problems or concerns. Continue Pred-Moxi-Brom 2x/day for 3 more weeks.  2.  Cataract accounts for the  patient's decreased vision. This visual impairment is not correctable with a tolerable change in glasses or contact lenses. Cataract surgery with an implantation of a new lens should significantly improve the visual and functional status of the patient. Discussed all risks, benefits, alternatives, and potential complications. Discussed the procedures and recovery. Patient desires to have surgery. A-scan ordered and performed today for intra-ocular lens calculations. The surgery will be performed in order to improve vision for driving, reading, and for eye examinations. Recommend phacoemulsification with intra-ocular lens. Recommend Dextenza for post-operative pain and inflammation. Left eye. Surgery required to balance vision. Dilates well - shugarcaine by protocol. Fuchs - Viscoat (DuoVisc)  3.  Corneal edema secondary to this. May require transplant in future.

## 2021-07-20 ENCOUNTER — Ambulatory Visit (HOSPITAL_COMMUNITY): Payer: Medicare Other | Admitting: Certified Registered"

## 2021-07-20 ENCOUNTER — Encounter (HOSPITAL_COMMUNITY): Payer: Self-pay | Admitting: Ophthalmology

## 2021-07-20 ENCOUNTER — Ambulatory Visit (HOSPITAL_COMMUNITY)
Admission: RE | Admit: 2021-07-20 | Discharge: 2021-07-20 | Disposition: A | Discharge status: Home / Self Care | Payer: Medicare Other | Attending: Ophthalmology | Admitting: Ophthalmology

## 2021-07-20 ENCOUNTER — Other Ambulatory Visit: Payer: Self-pay

## 2021-07-20 ENCOUNTER — Encounter (HOSPITAL_COMMUNITY)
Admission: RE | Disposition: A | Discharge status: Home / Self Care | Payer: Self-pay | Source: Home / Self Care | Attending: Ophthalmology

## 2021-07-20 DIAGNOSIS — H2512 Age-related nuclear cataract, left eye: Secondary | ICD-10-CM | POA: Insufficient documentation

## 2021-07-20 DIAGNOSIS — I10 Essential (primary) hypertension: Secondary | ICD-10-CM | POA: Insufficient documentation

## 2021-07-20 DIAGNOSIS — I350 Nonrheumatic aortic (valve) stenosis: Secondary | ICD-10-CM | POA: Diagnosis not present

## 2021-07-20 DIAGNOSIS — Z9841 Cataract extraction status, right eye: Secondary | ICD-10-CM | POA: Insufficient documentation

## 2021-07-20 DIAGNOSIS — H18513 Endothelial corneal dystrophy, bilateral: Secondary | ICD-10-CM | POA: Insufficient documentation

## 2021-07-20 HISTORY — PX: CATARACT EXTRACTION W/PHACO: SHX586

## 2021-07-20 SURGERY — PHACOEMULSIFICATION, CATARACT, WITH IOL INSERTION
Anesthesia: Monitor Anesthesia Care | Site: Eye | Laterality: Left

## 2021-07-20 MED ORDER — POVIDONE-IODINE 5 % OP SOLN
OPHTHALMIC | Status: DC | PRN
  Administered 2021-07-20: 1 via OPHTHALMIC

## 2021-07-20 MED ORDER — LIDOCAINE HCL (PF) 1 % IJ SOLN: INTRAOCULAR | Status: DC | PRN

## 2021-07-20 MED ORDER — EPINEPHRINE PF 1 MG/ML IJ SOLN
INTRAOCULAR | Status: DC | PRN
  Administered 2021-07-20: 500 mL

## 2021-07-20 MED ORDER — MIDAZOLAM HCL 2 MG/2ML IJ SOLN
INTRAMUSCULAR | Status: AC
  Filled 2021-07-20: qty 2

## 2021-07-20 MED ORDER — SODIUM HYALURONATE 10 MG/ML IO SOLUTION
PREFILLED_SYRINGE | INTRAOCULAR | Status: DC | PRN
  Administered 2021-07-20: 0.85 mL via INTRAOCULAR

## 2021-07-20 MED ORDER — TROPICAMIDE 1 % OP SOLN
1.0000 [drp] | OPHTHALMIC | Status: AC | PRN
  Administered 2021-07-20 (×3): 1 [drp] via OPHTHALMIC

## 2021-07-20 MED ORDER — SODIUM CHLORIDE 0.9% FLUSH
INTRAVENOUS | Status: DC | PRN
  Administered 2021-07-20: 5 mL via INTRAVENOUS

## 2021-07-20 MED ORDER — LIDOCAINE HCL 3.5 % OP GEL
1.0000 "application " | Freq: Once | OPHTHALMIC | Status: AC
  Administered 2021-07-20: 1 via OPHTHALMIC

## 2021-07-20 MED ORDER — MIDAZOLAM HCL 2 MG/2ML IJ SOLN
INTRAMUSCULAR | Status: DC | PRN
  Administered 2021-07-20: 1 mg via INTRAVENOUS

## 2021-07-20 MED ORDER — SODIUM HYALURONATE 23MG/ML IO SOSY
PREFILLED_SYRINGE | INTRAOCULAR | Status: DC | PRN
  Administered 2021-07-20: 0.6 mL via INTRAOCULAR

## 2021-07-20 MED ORDER — STERILE WATER FOR IRRIGATION IR SOLN
Status: DC | PRN
  Administered 2021-07-20: 250 mL

## 2021-07-20 MED ORDER — TETRACAINE HCL 0.5 % OP SOLN
1.0000 [drp] | OPHTHALMIC | Status: AC | PRN
  Administered 2021-07-20 (×3): 1 [drp] via OPHTHALMIC

## 2021-07-20 MED ORDER — EPINEPHRINE PF 1 MG/ML IJ SOLN
INTRAMUSCULAR | Status: AC
  Filled 2021-07-20: qty 2

## 2021-07-20 MED ORDER — BSS IO SOLN
INTRAOCULAR | Status: DC | PRN
  Administered 2021-07-20: 15 mL via INTRAOCULAR

## 2021-07-20 MED ORDER — PHENYLEPHRINE HCL 2.5 % OP SOLN
1.0000 [drp] | OPHTHALMIC | Status: AC | PRN
  Administered 2021-07-20 (×3): 1 [drp] via OPHTHALMIC

## 2021-07-20 MED ORDER — NEOMYCIN-POLYMYXIN-DEXAMETH 3.5-10000-0.1 OP SUSP: OPHTHALMIC | Status: DC | PRN

## 2021-07-20 SURGICAL SUPPLY — 14 items
CATARACT SUITE SIGHTPATH (MISCELLANEOUS) ×2 IMPLANT
CLOTH BEACON ORANGE TIMEOUT ST (SAFETY) ×1 IMPLANT
EYE SHIELD UNIVERSAL CLEAR (GAUZE/BANDAGES/DRESSINGS) ×1 IMPLANT
FEE CATARACT SUITE SIGHTPATH (MISCELLANEOUS) IMPLANT
GLOVE SURG UNDER POLY LF SZ7 (GLOVE) ×2 IMPLANT
LENS IOL RAYNER 21.5 (Intraocular Lens) ×2 IMPLANT
LENS IOL RAYONE EMV 21.5 (Intraocular Lens) IMPLANT
NDL HYPO 18GX1.5 BLUNT FILL (NEEDLE) IMPLANT
NEEDLE HYPO 18GX1.5 BLUNT FILL (NEEDLE) ×2 IMPLANT
PAD ARMBOARD 7.5X6 YLW CONV (MISCELLANEOUS) ×1 IMPLANT
SYR TB 1ML LL NO SAFETY (SYRINGE) ×1 IMPLANT
TAPE SURG TRANSPORE 1 IN (GAUZE/BANDAGES/DRESSINGS) IMPLANT
TAPE SURGICAL TRANSPORE 1 IN (GAUZE/BANDAGES/DRESSINGS) ×2
WATER STERILE IRR 250ML POUR (IV SOLUTION) ×1 IMPLANT

## 2021-07-20 NOTE — Anesthesia Procedure Notes (Signed)
Procedure Name: MAC Date/Time: 07/20/2021 1:18 PM Performed by: Orlie Dakin, CRNA Pre-anesthesia Checklist: Patient identified, Emergency Drugs available, Suction available and Patient being monitored Patient Re-evaluated:Patient Re-evaluated prior to induction Oxygen Delivery Method: Nasal cannula Placement Confirmation: positive ETCO2

## 2021-07-20 NOTE — Anesthesia Preprocedure Evaluation (Signed)
Anesthesia Evaluation  Patient identified by MRN, date of birth, ID band Patient awake    Reviewed: Allergy & Precautions, H&P , NPO status , Patient's Chart, lab work & pertinent test results, reviewed documented beta blocker date and time   Airway Mallampati: II  TM Distance: >3 FB Neck ROM: full    Dental no notable dental hx. (+) Upper Dentures, Lower Dentures   Pulmonary neg pulmonary ROS,    Pulmonary exam normal breath sounds clear to auscultation       Cardiovascular Exercise Tolerance: Good hypertension, + Valvular Problems/Murmurs  Rhythm:regular Rate:Normal     Neuro/Psych negative neurological ROS  negative psych ROS   GI/Hepatic Neg liver ROS, GERD  Medicated,  Endo/Other  negative endocrine ROS  Renal/GU negative Renal ROS  negative genitourinary   Musculoskeletal   Abdominal   Peds  Hematology negative hematology ROS (+)   Anesthesia Other Findings   Reproductive/Obstetrics negative OB ROS                             Anesthesia Physical Anesthesia Plan  ASA: 2  Anesthesia Plan: MAC   Post-op Pain Management:    Induction:   PONV Risk Score and Plan:   Airway Management Planned:   Additional Equipment:   Intra-op Plan:   Post-operative Plan:   Informed Consent: I have reviewed the patients History and Physical, chart, labs and discussed the procedure including the risks, benefits and alternatives for the proposed anesthesia with the patient or authorized representative who has indicated his/her understanding and acceptance.     Dental Advisory Given  Plan Discussed with: CRNA  Anesthesia Plan Comments:         Anesthesia Quick Evaluation  

## 2021-07-20 NOTE — Op Note (Signed)
Date of procedure: 07/20/21  Pre-operative diagnosis: Visually significant age-related nuclear cataract, Left Eye (H25.12)  Post-operative diagnosis: Visually significant age-related nuclear cataract, Left Eye  Procedure: Removal of cataract via phacoemulsification and insertion of intra-ocular lens Rayner RAO200E +21.5D into the capsular bag of the Left Eye  Attending surgeon: Gerda Diss. Jp Eastham, MD, MA  Anesthesia: MAC, Topical Akten  Complications: None  Estimated Blood Loss: <33m (minimal)  Specimens: None  Implants: As above  Indications:  Visually significant age-related cataract, Left Eye  Procedure:  The patient was seen and identified in the pre-operative area. The operative eye was identified and dilated.  The operative eye was marked.  Topical anesthesia was administered to the operative eye.     The patient was then to the operative suite and placed in the supine position.  A timeout was performed confirming the patient, procedure to be performed, and all other relevant information.   The patient's face was prepped and draped in the usual fashion for intra-ocular surgery.  A lid speculum was placed into the operative eye and the surgical microscope moved into place and focused.  An inferotemporal paracentesis was created using a 20 gauge paracentesis blade.  Shugarcaine was injected into the anterior chamber.  Viscoelastic was injected into the anterior chamber.  A temporal clear-corneal main wound incision was created using a 2.437mmicrokeratome.  A continuous curvilinear capsulorrhexis was initiated using an irrigating cystitome and completed using capsulorrhexis forceps.  Hydrodissection and hydrodeliniation were performed.  Viscoelastic was injected into the anterior chamber.  A phacoemulsification handpiece and a chopper as a second instrument were used to remove the nucleus and epinucleus. The irrigation/aspiration handpiece was used to remove any remaining cortical material.    The capsular bag was reinflated with viscoelastic, checked, and found to be intact.  The intraocular lens was inserted into the capsular bag.  The irrigation/aspiration handpiece was used to remove any remaining viscoelastic.  The clear corneal wound and paracentesis wounds were then hydrated and checked with Weck-Cels to be watertight.  The lid-speculum and drape was removed, and the patient's face was cleaned with a wet and dry 4x4. A clear shield was taped over the eye. The patient was taken to the post-operative care unit in good condition, having tolerated the procedure well.  Post-Op Instructions: [facility] and will receive all other orders and instructions.

## 2021-07-20 NOTE — Interval H&P Note (Signed)
History and Physical Interval Note:  07/20/2021 1:11 PM  Michelle [name]  has presented today for surgery, with the diagnosis of nuclear cataract left eye.  The various methods of treatment have been discussed with the patient and family. After consideration of risks, benefits and other options for treatment, the patient has consented to  Procedure(s) with comments: CATARACT EXTRACTION PHACO AND INTRAOCULAR LENS PLACEMENT ([place]) (Left) - left as a surgical intervention.  The patient's history has been reviewed, patient examined, no change in status, stable for surgery.  I have reviewed the patient's chart and labs.  Questions were answered to the patient's satisfaction.     Baruch Goldmann

## 2021-07-20 NOTE — Discharge Instructions (Signed)
Please discharge patient when stable, will follow up today with Dr. Aloura Matsuoka at the [facility] office immediately following discharge.  Leave shield in place until visit.  All paperwork with discharge instructions will be given at the office.  [facility] Address:  [address]  , [place] [address]  

## 2021-07-20 NOTE — Transfer of Care (Signed)
Immediate Anesthesia Transfer of Care Note  Patient: Michelle [name]  Procedure(s) Performed: CATARACT EXTRACTION PHACO AND INTRAOCULAR LENS PLACEMENT (IOC) (Left: Eye)  Patient Location: Short Stay  Anesthesia Type:MAC  Level of Consciousness: awake, alert  and oriented  Airway & Oxygen Therapy: Patient Spontanous Breathing  Post-op Assessment: Report given to RN, Post -op Vital signs reviewed and stable and Patient moving all extremities X 4  Post vital signs: Reviewed and stable  Last Vitals:  Vitals Value Taken Time  BP    Temp    Pulse    Resp    SpO2      Last Pain:  Vitals:   07/20/21 1223  TempSrc: Oral  PainSc: 0-No pain         Complications: No notable events documented.

## 2021-07-23 NOTE — Anesthesia Postprocedure Evaluation (Signed)
Anesthesia Post Note  Patient: Michelle [name]  Procedure(s) Performed: CATARACT EXTRACTION PHACO AND INTRAOCULAR LENS PLACEMENT (IOC) (Left: Eye)  Patient location during evaluation: Phase II Anesthesia Type: MAC Level of consciousness: awake Pain management: pain level controlled Vital Signs Assessment: post-procedure vital signs reviewed and stable Respiratory status: spontaneous breathing and respiratory function stable Cardiovascular status: blood pressure returned to baseline and stable Postop Assessment: no headache and no apparent nausea or vomiting Anesthetic complications: no Comments: Late entry   No notable events documented.   Last Vitals:  Vitals:   07/20/21 1223 07/20/21 1337  BP: (!) 158/67 132/60  Pulse: 84 73  Resp: 16 16  Temp: 37 C 36.8 C  SpO2: 100% 100%    Last Pain:  Vitals:   07/20/21 1337  TempSrc: Oral  PainSc: 0-No pain                 Louann Sjogren

## 2021-07-24 ENCOUNTER — Encounter (HOSPITAL_COMMUNITY): Payer: Self-pay | Admitting: Ophthalmology

## 2021-08-14 DIAGNOSIS — M79676 Pain in unspecified toe(s): Secondary | ICD-10-CM | POA: Diagnosis not present

## 2021-08-14 DIAGNOSIS — B351 Tinea unguium: Secondary | ICD-10-CM | POA: Diagnosis not present

## 2021-09-27 ENCOUNTER — Ambulatory Visit (INDEPENDENT_AMBULATORY_CARE_PROVIDER_SITE_OTHER): Payer: Medicare Other

## 2021-09-27 DIAGNOSIS — Z23 Encounter for immunization: Secondary | ICD-10-CM

## 2021-09-27 NOTE — Progress Notes (Signed)
Pt received shingrix today. Right deltoid without difficulty. ?

## 2021-10-01 ENCOUNTER — Other Ambulatory Visit (INDEPENDENT_AMBULATORY_CARE_PROVIDER_SITE_OTHER): Payer: Medicare Other

## 2021-10-01 DIAGNOSIS — Z23 Encounter for immunization: Secondary | ICD-10-CM

## 2021-10-23 DIAGNOSIS — B351 Tinea unguium: Secondary | ICD-10-CM | POA: Diagnosis not present

## 2021-10-23 DIAGNOSIS — M79676 Pain in unspecified toe(s): Secondary | ICD-10-CM | POA: Diagnosis not present

## 2021-11-22 ENCOUNTER — Ambulatory Visit (INDEPENDENT_AMBULATORY_CARE_PROVIDER_SITE_OTHER): Payer: Medicare Other

## 2021-11-22 DIAGNOSIS — Z23 Encounter for immunization: Secondary | ICD-10-CM

## 2021-11-30 ENCOUNTER — Telehealth: Payer: Self-pay | Admitting: *Deleted

## 2021-11-30 DIAGNOSIS — I359 Nonrheumatic aortic valve disorder, unspecified: Secondary | ICD-10-CM

## 2021-11-30 DIAGNOSIS — I6529 Occlusion and stenosis of unspecified carotid artery: Secondary | ICD-10-CM

## 2021-11-30 NOTE — Telephone Encounter (Signed)
AORTIC STENOSIS:  Her aortic stenosis was moderate in in Sept 2021.   I am going to check an echocardiogram again in July of next year.  I would not suspect this to be clinically progressing.   She has no new symptoms.    Above documentation is from Dr [facility] 11/22/2020 office visit note.  Order placed for 2 D Echo.

## 2021-12-03 NOTE — Telephone Encounter (Signed)
Pt has been scheduled for Echo at [facility] 12/11/2021.

## 2021-12-11 ENCOUNTER — Other Ambulatory Visit (HOSPITAL_COMMUNITY): Payer: Medicare Other

## 2021-12-14 ENCOUNTER — Ambulatory Visit (HOSPITAL_COMMUNITY)
Admission: RE | Admit: 2021-12-14 | Discharge: 2021-12-14 | Disposition: A | Discharge status: Home / Self Care | Payer: Medicare Other | Source: Ambulatory Visit | Attending: Cardiology | Admitting: Cardiology

## 2021-12-14 DIAGNOSIS — I351 Nonrheumatic aortic (valve) insufficiency: Secondary | ICD-10-CM | POA: Diagnosis not present

## 2021-12-14 DIAGNOSIS — I350 Nonrheumatic aortic (valve) stenosis: Secondary | ICD-10-CM

## 2021-12-14 DIAGNOSIS — I359 Nonrheumatic aortic valve disorder, unspecified: Secondary | ICD-10-CM | POA: Insufficient documentation

## 2021-12-14 LAB — ECHOCARDIOGRAM COMPLETE
AR max vel: 1.09 cm2
AV Area VTI: 1.08 cm2
AV Area mean vel: 1.19 cm2
AV Mean grad: 15.5 mmHg
AV Peak grad: 26.3 mmHg
Ao pk vel: 2.57 m/s
Area-P 1/2: 2.03 cm2
MV VTI: 1.08 cm2
P 1/2 time: 377 msec
S' Lateral: 2.2 cm

## 2021-12-14 NOTE — Progress Notes (Signed)
*  PRELIMINARY RESULTS* Echocardiogram 2D Echocardiogram has been performed.  Michelle [name] 12/14/2021, 3:28 PM

## 2021-12-18 NOTE — Progress Notes (Unsigned)
Cardiology Office Note   Date:  12/19/2021   ID:  Michelle [name], DOB 1934-03-03, MRN [id number]  PCP:  Dettinger, Michelle Kaufmann, MD  Cardiologist:   None   Chief Complaint  Patient presents with   Aortic Stenosis      History of Present Illness: Michelle [name] is a 88 y.o. female who presents for follow up of mild to moderate AS on echo 2023.  She had mild to moderate MS. she has done well since I last saw her.  She gets around in her home doing her chores.  She still goes unfortunately up and down the stairs to her washer dryer.   The patient denies any new symptoms such as chest discomfort, neck or arm discomfort. There has been no new shortness of breath, PND or orthopnea. There have been no reported palpitations, presyncope or syncope.    Past Medical History:  Diagnosis Date   Aortic stenosis    Mild echo 2014   Diverticulosis    Duodenitis without mention of hemorrhage    Esophagitis, unspecified    GERD (gastroesophageal reflux disease)    Heart murmur    Hypertension    IBS (irritable bowel syndrome)    Uterine cancer [facility])     Past Surgical History:  Procedure Laterality Date   CATARACT EXTRACTION W/PHACO Right 06/01/2021   Procedure: CATARACT EXTRACTION PHACO AND INTRAOCULAR LENS PLACEMENT ([place]);  Surgeon: Michelle Goldmann, MD;  Location: [facility];  Service: Ophthalmology;  Laterality: Right;  CDE 18.80   CATARACT EXTRACTION W/PHACO Left 07/20/2021   Procedure: CATARACT EXTRACTION PHACO AND INTRAOCULAR LENS PLACEMENT (IOC);  Surgeon: Michelle Goldmann, MD;  Location: [facility];  Service: Ophthalmology;  Laterality: Left;  CDE: 17.96   FOOT SURGERY     bilateral    VAGINAL HYSTERECTOMY       Current Outpatient Medications  Medication Sig Dispense Refill   acetaminophen (TYLENOL) 500 MG tablet Take 500 mg by mouth every 6 (six) hours as needed for headache.     amLODipine (NORVASC) 5 MG tablet Take 1.5 tablets (7.5 mg total) by mouth daily. 135 tablet 3   cetirizine  (ZYRTEC ALLERGY) 10 MG tablet Take 1 tablet (10 mg total) by mouth daily. As needed 30 tablet 5   Cholecalciferol (VITAMIN D3) 2000 UNITS TABS Take by mouth daily.     fluticasone (FLONASE) 50 MCG/ACT nasal spray 1 spray in both nostrils 2 times daily as needed for allergies or rhinitis. 16 g 2   hydrochlorothiazide (HYDRODIURIL) 25 MG tablet Take 1 tablet (25 mg total) by mouth daily. 90 tablet 3   meclizine (ANTIVERT) 25 MG tablet Take 1 tablet (25 mg total) by mouth 3 (three) times daily as needed for dizziness. 90 tablet 3   polyethylene glycol (MIRALAX / GLYCOLAX) packet Take 17 g by mouth daily. 14 each 0   sodium chloride (MURO 128) 5 % ophthalmic solution 1 drop 3 (three) times daily.     Wheat Dextrin (BENEFIBER) POWD Take by mouth. Takes in the morning     No current facility-administered medications for this visit.    Allergies:   Nitrofurantoin, Lisinopril, and Sulfonamide derivatives    ROS:  Please see the history of present illness.   Otherwise, review of systems are positive for none.   All other systems are reviewed and negative.    PHYSICAL EXAM: VS:  BP (!) 148/66   Pulse 79   Ht '5\' 7"'$  (1.702 m)   Wt 153  lb 3.2 oz (69.5 kg)   SpO2 96%   BMI 23.99 kg/m  , BMI Body mass index is 23.99 kg/m. GENERAL:  Well appearing NECK:  No jugular venous distention, waveform within normal limits, carotid upstroke brisk and symmetric, no bruits, no thyromegaly LUNGS:  Clear to auscultation bilaterally CHEST:  Unremarkable HEART:  PMI not displaced or sustained,S1 and S2 within normal limits, no S3, no S4, no clicks, no rubs, 3 out of 6 apical systolic murmur mid peaking, radiating at aortic outflow tract, no diastolic murmurs ABD:  Flat, positive bowel sounds normal in frequency in pitch, no bruits, no rebound, no guarding, no midline pulsatile mass, no hepatomegaly, no splenomegaly EXT:  2 plus pulses throughout, no edema, no cyanosis no clubbing   EKG:  EKG is  ordered  today. The ekg ordered today demonstrates sinus rhythm, rate 79, left axis deviation, premature atrial contractions, poor anterior R wave progression, no change from previous.  Possible old anteroseptal infarct   Recent Labs: 01/10/2021: ALT 10; BUN 18; Creatinine, Ser 0.75; Hemoglobin 12.5; Platelets 203; Potassium 3.6; Sodium 141; TSH 2.140    Lipid Panel    Component Value Date/Time   CHOL 247 (H) 01/10/2021 1107   TRIG 47 01/10/2021 1107   TRIG 67 12/01/2014 1039   HDL 83 01/10/2021 1107   HDL 81 12/01/2014 1039   CHOLHDL 3.0 01/10/2021 1107   LDLCALC 157 (H) 01/10/2021 1107   LDLCALC 169 (H) 02/11/2013 0912      Wt Readings from Last 3 Encounters:  12/19/21 153 lb 3.2 oz (69.5 kg)  07/12/21 154 lb 15.7 oz (70.3 kg)  05/30/21 155 lb (70.3 kg)      Other studies Reviewed: Additional studies/ records that were reviewed today include:  Echo  . Review of the above records demonstrates:  Please see elsewhere in the note.     ASSESSMENT AND PLAN:  AORTIC STENOSIS:  Her aortic stenosis was moderate last week.   She has no symptoms related to this.  It is moderate.  I will follow-up with another echo next year.    HTN:  The blood pressure is fairly consistently elevated and I will increase her amlodipine to 7 and half milligrams daily.   LVH: She does have some LVH and septal hypertrophy.  However, I am can manage this conservatively.  She is not having any overt dyspnea.  No change in therapy.  CAROTID STENOSIS:  This was mild in 2018.  No further imaging   Current medicines are reviewed at length with the patient today.  The patient does not have concerns regarding medicines.  The following changes have been made: As above  Labs/ tests ordered today include:    Orders Placed This Encounter  Procedures   EKG 12-Lead   ECHOCARDIOGRAM COMPLETE     Disposition:   FU with me 1 year after the echocardiogram   Signed, Michelle Breeding, MD  12/19/2021 12:04 PM    [place]

## 2021-12-19 ENCOUNTER — Encounter: Payer: Self-pay | Admitting: Cardiology

## 2021-12-19 ENCOUNTER — Ambulatory Visit: Payer: Medicare Other | Admitting: Cardiology

## 2021-12-19 VITALS — BP 148/66 | HR 79 | Ht 67.0 in | Wt 153.2 lb

## 2021-12-19 DIAGNOSIS — I350 Nonrheumatic aortic (valve) stenosis: Secondary | ICD-10-CM | POA: Diagnosis not present

## 2021-12-19 DIAGNOSIS — I6529 Occlusion and stenosis of unspecified carotid artery: Secondary | ICD-10-CM

## 2021-12-19 DIAGNOSIS — I10 Essential (primary) hypertension: Secondary | ICD-10-CM

## 2021-12-19 MED ORDER — AMLODIPINE BESYLATE 5 MG PO TABS: 7.5000 mg | ORAL_TABLET | Freq: Every day | ORAL | 3 refills | Status: DC

## 2021-12-19 NOTE — Patient Instructions (Signed)
Medication Instructions:  Please increase Amlodipine to 7.5 mg a day.  Continue all other medications as listed.  *If you need a refill on your cardiac medications before your next appointment, please call your pharmacy*  Testing/Procedures: Your physician has requested that you have an echocardiogram in 1 year (before seeing Dr Percival Spanish in [place]). Echocardiography is a painless test that uses sound waves to create images of your heart. It provides your doctor with information about the size and shape of your heart and how well your heart's chambers and valves are working. This procedure takes approximately one hour. There are no restrictions for this procedure.  Follow-Up: At [facility], you and your health needs are our priority.  As part of our continuing mission to provide you with exceptional heart care, we have created designated Provider Care Teams.  These Care Teams include your primary Cardiologist (physician) and Advanced Practice Providers (APPs -  Physician Assistants and Nurse Practitioners) who all work together to provide you with the care you need, when you need it.  We recommend signing up for the patient portal called "MyChart".  Sign up information is provided on this After Visit Summary.  MyChart is used to connect with patients for Virtual Visits (Telemedicine).  Patients are able to view lab/test results, encounter notes, upcoming appointments, etc.  Non-urgent messages can be sent to your provider as well.   To learn more about what you can do with MyChart, go to [email or web address].    Your next appointment:   1 year(s)  The format for your next appointment:   In Person  Provider:   Minus Breeding, MD{   Important Information About Sugar

## 2022-01-01 DIAGNOSIS — M79676 Pain in unspecified toe(s): Secondary | ICD-10-CM | POA: Diagnosis not present

## 2022-01-01 DIAGNOSIS — B351 Tinea unguium: Secondary | ICD-10-CM | POA: Diagnosis not present

## 2022-01-23 ENCOUNTER — Encounter: Payer: Self-pay | Admitting: Family Medicine

## 2022-01-23 ENCOUNTER — Ambulatory Visit (INDEPENDENT_AMBULATORY_CARE_PROVIDER_SITE_OTHER): Payer: Medicare Other | Admitting: Family Medicine

## 2022-01-23 VITALS — BP 131/69 | HR 83 | Temp 98.2°F | Ht 67.0 in | Wt 150.0 lb

## 2022-01-23 DIAGNOSIS — E559 Vitamin D deficiency, unspecified: Secondary | ICD-10-CM | POA: Diagnosis not present

## 2022-01-23 DIAGNOSIS — Z0001 Encounter for general adult medical examination with abnormal findings: Secondary | ICD-10-CM | POA: Diagnosis not present

## 2022-01-23 DIAGNOSIS — I10 Essential (primary) hypertension: Secondary | ICD-10-CM

## 2022-01-23 DIAGNOSIS — Z0000 Encounter for general adult medical examination without abnormal findings: Secondary | ICD-10-CM

## 2022-01-23 DIAGNOSIS — M8580 Other specified disorders of bone density and structure, unspecified site: Secondary | ICD-10-CM

## 2022-01-23 NOTE — Progress Notes (Signed)
BP 131/69   Pulse 83   Temp 98.2 F (36.8 C)   Ht 5\' 7"  (1.702 m)   Wt 150 lb (68 kg)   SpO2 96%   BMI 23.49 kg/m    Subjective:   Patient ID: Michelle [name], female    DOB: 1934-05-05, 88 y.o.   MRN: [id number]  HPI: Michelle [name] is a 88 y.o. female presenting on 01/23/2022 for Medical Management of Chronic Issues (CPE)   HPI Physical exam Patient denies any chest pain, shortness of breath, headaches or vision issues, abdominal complaints, diarrhea, nausea, vomiting, or joint issues.  Osteoporosis/osteopenia vitamin D deficiency recheck Fractures or history of fracture: none Medication: Vitamin D Duration of treatment: 5 years Last bone density scan: 02/26/21 Last T score: -1.8   Hypertension Patient is currently on amlodipine and hydrochlorothiazide, and their blood pressure today is 131/69. Patient denies any lightheadedness or dizziness. Patient denies headaches, blurred vision, chest pains, shortness of breath, or weakness. Denies any side effects from medication and is content with current medication.   Patient says she does have some urinary frequency especially at night that wakes her up multiple times at night we discussed possibility of bladder medication and she states not by Dr. 02/28/21 things but just wanted to mention it at this point.  It sounds like she does have overactive bladder    Relevant past medical, surgical, family and social history reviewed and updated as indicated. Interim medical history since our last visit reviewed. Allergies and medications reviewed and updated.  Review of Systems  Constitutional:  Negative for chills and fever.  HENT:  Negative for congestion, ear discharge, ear pain and tinnitus.   Eyes:  Negative for pain, redness and visual disturbance.  Respiratory:  Negative for cough, chest tightness, shortness of breath and wheezing.   Cardiovascular:  Negative for chest pain, palpitations and leg swelling.  Gastrointestinal:   Negative for abdominal pain, blood in stool, constipation and diarrhea.  Genitourinary:  Negative for difficulty urinating, dysuria and hematuria.  Musculoskeletal:  Positive for arthralgias. Negative for back pain, gait problem and myalgias.  Skin:  Negative for rash.  Neurological:  Negative for dizziness, weakness, light-headedness and headaches.  Psychiatric/Behavioral:  Negative for agitation, behavioral problems and suicidal ideas.   All other systems reviewed and are negative.   Per HPI unless specifically indicated above   Allergies as of 01/23/2022       Reactions   Nitrofurantoin    Pt does not remember this medicine   Lisinopril Cough   Sulfonamide Derivatives Rash        Medication List        Accurate as of January 23, 2022  3:17 PM. If you have any questions, ask your nurse or doctor.          acetaminophen 500 MG tablet Commonly known as: TYLENOL Take 500 mg by mouth every 6 (six) hours as needed for headache.   amLODipine 5 MG tablet Commonly known as: NORVASC Take 1.5 tablets (7.5 mg total) by mouth daily.   Benefiber Powd Take by mouth. Takes in the morning   cetirizine 10 MG tablet Commonly known as: ZyrTEC Allergy Take 1 tablet (10 mg total) by mouth daily. As needed   fluticasone 50 MCG/ACT nasal spray Commonly known as: FLONASE 1 spray in both nostrils 2 times daily as needed for allergies or rhinitis.   hydrochlorothiazide 25 MG tablet Commonly known as: HYDRODIURIL Take 1 tablet (25 mg total) by mouth  daily.   meclizine 25 MG tablet Commonly known as: ANTIVERT Take 1 tablet (25 mg total) by mouth 3 (three) times daily as needed for dizziness.   polyethylene glycol 17 g packet Commonly known as: MIRALAX / GLYCOLAX Take 17 g by mouth daily.   sodium chloride 5 % ophthalmic solution Commonly known as: MURO 128 1 drop 3 (three) times daily.   Vitamin D3 50 MCG (2000 UT) Tabs Take by mouth daily.         Objective:   BP  131/69   Pulse 83   Temp 98.2 F (36.8 C)   Ht $R'5\' 7"'cm$  (1.702 m)   Wt 150 lb (68 kg)   SpO2 96%   BMI 23.49 kg/m   Wt Readings from Last 3 Encounters:  01/23/22 150 lb (68 kg)  12/19/21 153 lb 3.2 oz (69.5 kg)  07/12/21 154 lb 15.7 oz (70.3 kg)    Physical Exam Vitals and nursing note reviewed.  Constitutional:      General: She is not in acute distress.    Appearance: She is well-developed. She is not diaphoretic.  HENT:     Right Ear: Tympanic membrane normal.     Left Ear: Tympanic membrane normal.     Mouth/Throat:     Mouth: Mucous membranes are moist.     Pharynx: Oropharynx is clear. No oropharyngeal exudate.  Eyes:     Conjunctiva/sclera: Conjunctivae normal.  Cardiovascular:     Rate and Rhythm: Normal rate and regular rhythm.     Heart sounds: Murmur (Systolic murmur 3 out of 6 best heard at left second intercostal space) heard.  Pulmonary:     Effort: Pulmonary effort is normal. No respiratory distress.     Breath sounds: Normal breath sounds. No wheezing.  Abdominal:     General: Abdomen is flat. Bowel sounds are normal. There is no distension.     Tenderness: There is no abdominal tenderness. There is no right CVA tenderness, left CVA tenderness, guarding or rebound.  Musculoskeletal:        General: No tenderness. Normal range of motion.  Skin:    General: Skin is warm and dry.     Findings: No rash.  Neurological:     Mental Status: She is alert and oriented to person, place, and time.     Coordination: Coordination normal.  Psychiatric:        Behavior: Behavior normal.       Assessment & Plan:   Problem List Items Addressed This Visit       Cardiovascular and Mediastinum   Hypertension     Musculoskeletal and Integument   Osteopenia     Other   Vitamin D deficiency   Relevant Orders   CBC with Differential/Platelet   CMP14+EGFR   Lipid panel   VITAMIN D 25 Hydroxy (Vit-D Deficiency, Fractures)   Other Visit Diagnoses     Physical  exam    -  Primary   Essential hypertension       Relevant Orders   CBC with Differential/Platelet   CMP14+EGFR   Lipid panel   VITAMIN D 25 Hydroxy (Vit-D Deficiency, Fractures)     Check blood work today and we will call her back about her blood work but for the most part it seems like things are stable.  No changes.  Follow up plan: Return in about 1 year (around 01/24/2023), or if symptoms worsen or fail to improve, for Physical exam and hypertension.  Counseling provided  for all of the vaccine components Orders Placed This Encounter  Procedures   CBC with Differential/Platelet   CMP14+EGFR   Lipid panel   VITAMIN D 25 Hydroxy (Vit-D Deficiency, Fractures)    Caryl Pina, MD [facility] 01/23/2022, 3:17 PM

## 2022-01-24 LAB — CMP14+EGFR
ALT: 10 IU/L (ref 0–32)
AST: 19 IU/L (ref 0–40)
Albumin/Globulin Ratio: 2 (ref 1.2–2.2)
Albumin: 4.3 g/dL (ref 3.7–4.7)
Alkaline Phosphatase: 60 IU/L (ref 44–121)
BUN/Creatinine Ratio: 30 — ABNORMAL HIGH (ref 12–28)
BUN: 25 mg/dL (ref 8–27)
Bilirubin Total: 0.4 mg/dL (ref 0.0–1.2)
CO2: 26 mmol/L (ref 20–29)
Calcium: 9.2 mg/dL (ref 8.7–10.3)
Chloride: 99 mmol/L (ref 96–106)
Creatinine, Ser: 0.83 mg/dL (ref 0.57–1.00)
Globulin, Total: 2.1 g/dL (ref 1.5–4.5)
Glucose: 114 mg/dL — ABNORMAL HIGH (ref 70–99)
Potassium: 3.6 mmol/L (ref 3.5–5.2)
Sodium: 141 mmol/L (ref 134–144)
Total Protein: 6.4 g/dL (ref 6.0–8.5)
eGFR: 69 mL/min/{1.73_m2} (ref >59)

## 2022-01-24 LAB — CBC WITH DIFFERENTIAL/PLATELET
Basophils Absolute: 0.1 10*3/uL (ref 0.0–0.2)
Basos: 1 %
EOS (ABSOLUTE): 0.1 10*3/uL (ref 0.0–0.4)
Eos: 1 %
Hematocrit: 37.9 % (ref 34.0–46.6)
Hemoglobin: 12.3 g/dL (ref 11.1–15.9)
Immature Grans (Abs): 0 10*3/uL (ref 0.0–0.1)
Immature Granulocytes: 0 %
Lymphocytes Absolute: 0.7 10*3/uL (ref 0.7–3.1)
Lymphs: 16 %
MCH: 30.8 pg (ref 26.6–33.0)
MCHC: 32.5 g/dL (ref 31.5–35.7)
MCV: 95 fL (ref 79–97)
Monocytes Absolute: 0.4 10*3/uL (ref 0.1–0.9)
Monocytes: 8 %
Neutrophils Absolute: 3.4 10*3/uL (ref 1.4–7.0)
Neutrophils: 74 %
Platelets: 203 10*3/uL (ref 150–450)
RBC: 3.99 x10E6/uL (ref 3.77–5.28)
RDW: 13 % (ref 11.7–15.4)
WBC: 4.6 10*3/uL (ref 3.4–10.8)

## 2022-01-24 LAB — LIPID PANEL
Chol/HDL Ratio: 3 ratio (ref 0.0–4.4)
Cholesterol, Total: 257 mg/dL — ABNORMAL HIGH (ref 100–199)
HDL: 87 mg/dL (ref >39)
LDL Chol Calc (NIH): 158 mg/dL — ABNORMAL HIGH (ref 0–99)
Triglycerides: 76 mg/dL (ref 0–149)
VLDL Cholesterol Cal: 12 mg/dL (ref 5–40)

## 2022-01-24 LAB — VITAMIN D 25 HYDROXY (VIT D DEFICIENCY, FRACTURES): Vit D, 25-Hydroxy: 80.8 ng/mL (ref 30.0–100.0)

## 2022-01-30 NOTE — Progress Notes (Signed)
Patient returning call. Please call back

## 2022-03-12 DIAGNOSIS — B351 Tinea unguium: Secondary | ICD-10-CM | POA: Diagnosis not present

## 2022-03-12 DIAGNOSIS — M79676 Pain in unspecified toe(s): Secondary | ICD-10-CM | POA: Diagnosis not present

## 2022-03-13 ENCOUNTER — Other Ambulatory Visit: Payer: Self-pay | Admitting: Family Medicine

## 2022-03-13 DIAGNOSIS — I10 Essential (primary) hypertension: Secondary | ICD-10-CM

## 2022-04-11 ENCOUNTER — Ambulatory Visit (INDEPENDENT_AMBULATORY_CARE_PROVIDER_SITE_OTHER): Payer: Medicare Other

## 2022-04-11 DIAGNOSIS — Z0000 Encounter for general adult medical examination without abnormal findings: Secondary | ICD-10-CM | POA: Diagnosis not present

## 2022-04-11 NOTE — Progress Notes (Signed)
MEDICARE ANNUAL WELLNESS VISIT  04/11/2022  Telephone Visit Disclaimer This Medicare AWV was conducted by telephone due to national recommendations for restrictions regarding the COVID-19 Pandemic (e.g. social distancing).  I verified, using two identifiers, that I am speaking with Michelle [name] or their authorized healthcare agent. I discussed the limitations, risks, security, and privacy concerns of performing an evaluation and management service by telephone and the potential availability of an in-person appointment in the future. The patient expressed understanding and agreed to proceed.  Location of Patient: Home Location of Provider (nurse):  [facility]  Subjective:    Michelle [name] is a 88 y.o. female patient of Dettinger, Fransisca Kaufmann, MD who had a Medicare Annual Wellness Visit today via telephone. Tegan is Retired and lives with their spouse. She had two sons and one daughter.  Her sons passed away when they were younger.   She reports that she is socially active and does interact with friends/family regularly. She is minimally physically active and enjoys spending time with family and reading.  Patient Care Team: Dettinger, Fransisca Kaufmann, MD as PCP - General (Family Medicine) Celestia Khat, [place] (Optometry) Allyn Kenner, MD (Dermatology) Minus Breeding, MD as Consulting Physician (Cardiology)     04/11/2022    9:46 AM 07/12/2021   10:24 AM 05/30/2021    5:46 PM 04/10/2021    9:43 AM 12/30/2019    9:13 AM 12/03/2018    9:35 AM  Advanced Directives  Does Patient Have a Medical Advance Directive? No No Yes Yes Yes Yes  Type of Comptroller;Living will [place];Living will [place];Living will [place];Living will  Does patient want to make changes to medical advance directive?   No - Patient declined  No - Patient declined No - Patient declined  Copy of [place] in Chart?    No - copy requested No - copy requested No - copy requested No - copy requested  Would patient like information on creating a medical advance directive? No - Patient declined No - Patient declined        Hospital Utilization Over the Past 12 Months: # of hospitalizations or ER visits: 0 # of surgeries: 0  Review of Systems    Patient reports that her overall health is  unchanged compared to last year.  History obtained from chart review and the patient  Patient Reported Readings (BP, Pulse, CBG, Weight, etc) none  Pain Assessment Pain : No/denies pain     Current Medications & Allergies (verified) Allergies as of 04/11/2022       Reactions   Nitrofurantoin    Pt does not remember this medicine   Lisinopril Cough   Sulfonamide Derivatives Rash        Medication List        Accurate as of April 11, 2022  9:52 AM. If you have any questions, ask your nurse or doctor.          acetaminophen 500 MG tablet Commonly known as: TYLENOL Take 500 mg by mouth every 6 (six) hours as needed for headache.   amLODipine 5 MG tablet Commonly known as: NORVASC Take 1.5 tablets (7.5 mg total) by mouth daily.   Benefiber Powd Take by mouth. Takes in the morning   cetirizine 10 MG tablet Commonly known as: ZyrTEC Allergy Take 1 tablet (10 mg total) by mouth daily. As needed   fluticasone 50 MCG/ACT  nasal spray Commonly known as: FLONASE 1 spray in both nostrils 2 times daily as needed for allergies or rhinitis.   hydrochlorothiazide 25 MG tablet Commonly known as: HYDRODIURIL TAKE ONE TABLET ONCE DAILY   meclizine 25 MG tablet Commonly known as: ANTIVERT Take 1 tablet (25 mg total) by mouth 3 (three) times daily as needed for dizziness.   polyethylene glycol 17 g packet Commonly known as: MIRALAX / GLYCOLAX Take 17 g by mouth daily.   sodium chloride 5 % ophthalmic solution Commonly known as: MURO 128 1 drop 3 (three) times daily.   Vitamin D3 50 MCG (2000  UT) Tabs Take by mouth daily.        History (reviewed): Past Medical History:  Diagnosis Date   Aortic stenosis    Mild echo 2014   Diverticulosis    Duodenitis without mention of hemorrhage    Esophagitis, unspecified    GERD (gastroesophageal reflux disease)    Heart murmur    Hypertension    IBS (irritable bowel syndrome)    Uterine cancer [facility])    Past Surgical History:  Procedure Laterality Date   CATARACT EXTRACTION W/PHACO Right 06/01/2021   Procedure: CATARACT EXTRACTION PHACO AND INTRAOCULAR LENS PLACEMENT ([place]);  Surgeon: Baruch Goldmann, MD;  Location: [facility];  Service: Ophthalmology;  Laterality: Right;  CDE 18.80   CATARACT EXTRACTION W/PHACO Left 07/20/2021   Procedure: CATARACT EXTRACTION PHACO AND INTRAOCULAR LENS PLACEMENT ([facility]);  Surgeon: Baruch Goldmann, MD;  Location: [facility];  Service: Ophthalmology;  Laterality: Left;  CDE: 17.96   FOOT SURGERY     bilateral    VAGINAL HYSTERECTOMY     Family History  Problem Relation Age of Onset   Heart disease Mother 61       CHF   COPD Father    Eczema Father    Colon cancer Other        Per patient unsure of what cancer in family    Social History   Socioeconomic History   Marital status: Married    Spouse name: [name]   Number of children: 3   Years of education: 9   Highest education level: 9th grade  Occupational History   Occupation: retired  Tobacco Use   Smoking status: Never   Smokeless tobacco: Never  Vaping Use   Vaping Use: Never used  Substance and Sexual Activity   Alcohol use: No   Drug use: No   Sexual activity: Not Currently  Other Topics Concern   Not on file  Social History Narrative   Lives with husband in one level home with a basement.   Social Determinants of Health   Financial Resource Strain: Low Risk  (04/10/2021)   Overall Financial Resource Strain (CARDIA)    Difficulty of Paying Living Expenses: Not hard at all  Food Insecurity: No Food Insecurity (04/10/2021)    Hunger Vital Sign    Worried About Running Out of Food in the Last Year: Never true    Ran Out of Food in the Last Year: Never true  Transportation Needs: No Transportation Needs (04/10/2021)   PRAPARE - Hydrologist (Medical): No    Lack of Transportation (Non-Medical): No  Physical Activity: Insufficiently Active (04/10/2021)   Exercise Vital Sign    Days of Exercise per Week: 7 days    Minutes of Exercise per Session: 10 min  Stress: No Stress Concern Present (04/10/2021)   [place]  Feeling of Stress : Only a little  Social Connections: Moderately Integrated (04/10/2021)   Social Connection and Isolation Panel [NHANES]    Frequency of Communication with Friends and Family: More than three times a week    Frequency of Social Gatherings with Friends and Family: More than three times a week    Attends Religious Services: 1 to 4 times per year    Active Member of [employer] or Organizations: No    Attends Archivist Meetings: Never    Marital Status: Married    Activities of Daily Living    04/11/2022    9:46 AM 07/12/2021   10:26 AM  In your present state of health, do you have any difficulty performing the following activities:  Hearing? 0   Vision? 0   Difficulty concentrating or making decisions? 0   Walking or climbing stairs? 0   Dressing or bathing? 0   Doing errands, shopping? 0 0  Preparing Food and eating ? N   Using the Toilet? N   In the past six months, have you accidently leaked urine? N   Do you have problems with loss of bowel control? N   Managing your Medications? N   Managing your Finances? N   Housekeeping or managing your Housekeeping? N     Patient Education/ Literacy How often do you need to have someone help you when you read instructions, pamphlets, or other written materials from your doctor or pharmacy?: 1 - Never What is the last grade level you  completed in school?: 9th grade  Exercise Current Exercise Habits: The patient does not participate in regular exercise at present, Exercise limited by: None identified  Diet Patient reports consuming 3 meals a day and 1 snack(s) a day Patient reports that her primary diet is: Regular Patient reports that she does have regular access to food.   Depression Screen    04/11/2022    9:52 AM 01/23/2022    2:24 PM 04/10/2021    9:41 AM 02/23/2021   11:42 AM 01/10/2021   10:00 AM 01/10/2020    9:54 AM 12/30/2019    9:04 AM  PHQ 2/9 Scores  PHQ - 2 Score 0 0 0 0 1 0 0  PHQ- 9 Score  0 3         Fall Risk    04/11/2022    9:51 AM 01/23/2022    2:24 PM 04/10/2021    9:43 AM 02/23/2021   11:42 AM 01/10/2021   10:00 AM  Fall Risk   Falls in the past year? 0 0 0 0 0  Number falls in past yr:   0    Injury with Fall?   0    Risk for fall due to :   Impaired balance/gait    Follow up Falls evaluation completed  Falls prevention discussed;Education provided       Objective:  Michelle [name] seemed alert and oriented and she participated appropriately during our telephone visit.  Blood Pressure Weight BMI  BP Readings from Last 3 Encounters:  01/23/22 131/69  12/19/21 (!) 148/66  07/20/21 132/60   Wt Readings from Last 3 Encounters:  01/23/22 150 lb (68 kg)  12/19/21 153 lb 3.2 oz (69.5 kg)  07/12/21 154 lb 15.7 oz (70.3 kg)   BMI Readings from Last 1 Encounters:  01/23/22 23.49 kg/m    *Unable to obtain current vital signs, weight, and BMI due to telephone visit type  Hearing/Vision  Annleigh did not  seem to have difficulty with hearing/understanding during the telephone conversation Reports that she has had a formal eye exam by an eye care professional within the past year Reports that she has not had a formal hearing evaluation within the past year *Unable to fully assess hearing and vision during telephone visit type  Cognitive Function:    04/11/2022    9:50 AM 12/30/2019     9:18 AM 12/03/2018    9:30 AM  6CIT Screen  What Year? 0 points 0 points 0 points  What month? 0 points 0 points 0 points  What time? 0 points 0 points 0 points  Count back from 20 0 points 0 points 0 points  Months in reverse 0 points 0 points 0 points  Repeat phrase 0 points 0 points 0 points  Total Score 0 points 0 points 0 points   (Normal:0-7, Significant for Dysfunction: >8)  Normal Cognitive Function Screening: Yes   Immunization & Health Maintenance Record Immunization History  Administered Date(s) Administered   Fluad Quad(high Dose 65+) 04/09/2019, 04/11/2021   Influenza, High Dose Seasonal PF 04/19/2016, 04/30/2017, 04/15/2018   Influenza,inj,Quad PF,6+ Mos 04/26/2013, 06/02/2014, 04/21/2015   Moderna Sars-Covid-2 Vaccination 09/20/2019, 10/18/2019, 06/27/2020, 02/06/2021   Pneumococcal Conjugate-13 12/01/2014   Pneumococcal Polysaccharide-23 04/29/2016   Tdap 10/15/2011, 09/27/2021   Zoster Recombinat (Shingrix) 09/27/2021, 11/22/2021    Health Maintenance  Topic Date Due   COVID-19 Vaccine (5 - Moderna series) 04/03/2021   INFLUENZA VACCINE  10/13/2022 (Originally 02/12/2022)   TETANUS/TDAP  09/28/2031   Pneumonia Vaccine 55+ Years old  Completed   DEXA SCAN  Completed   Zoster Vaccines- Shingrix  Completed   HPV VACCINES  Aged Out   MAMMOGRAM  Discontinued       Assessment  This is a routine wellness examination for Michelle [name].  Health Maintenance: Due or Overdue Health Maintenance Due  Topic Date Due   COVID-19 Vaccine (5 - Moderna series) 04/03/2021    Michelle [name] does not need a referral for Community Assistance: Care Management:   no Social Work:    no Prescription Assistance:  no Nutrition/Diabetes Education:  no   Plan:  Personalized Goals  Goals Addressed             This Visit's Progress    Patient Stated       04/11/2022 AWV Goal: Fall Prevention  Over the next year, patient will decrease their risk for falls by: Using  assistive devices, such as a cane or walker, as needed Identifying fall risks within their home and correcting them by: Removing throw rugs Adding handrails to stairs or ramps Removing clutter and keeping a clear pathway throughout the home Increasing light, especially at night Adding shower handles/bars Raising toilet seat Identifying potential personal risk factors for falls: Medication side effects Incontinence/urgency Vestibular dysfunction Hearing loss Musculoskeletal disorders Neurological disorders Orthostatic hypotension         Personalized Health Maintenance & Screening Recommendations  Influenza vaccine  Lung Cancer Screening Recommended: no (Low Dose CT Chest recommended if Age 1-80 years, 30 pack-year currently smoking OR have quit w/in past 15 years) Hepatitis C Screening recommended: no HIV Screening recommended: no  Advanced Directives: Written information was not prepared per patient's request.  Referrals & Orders No orders of the defined types were placed in this encounter.   Follow-up Plan Follow-up with Dettinger, Fransisca Kaufmann, MD as planned   I have personally reviewed and noted the following in the patient's chart:  Medical and social history Use of alcohol, tobacco or illicit drugs  Current medications and supplements Functional ability and status Nutritional status Physical activity Advanced directives List of other physicians Hospitalizations, surgeries, and ER visits in previous 12 months Vitals Screenings to include cognitive, depression, and falls Referrals and appointments  In addition, I have reviewed and discussed with Michelle [name] certain preventive protocols, quality metrics, and best practice recommendations. A written personalized care plan for preventive services as well as general preventive health recommendations is available and can be mailed to the patient at her request.      Felicity Coyer, LPN    [id number]/74/7340    Patient  declined after visit summary

## 2022-04-18 ENCOUNTER — Ambulatory Visit (INDEPENDENT_AMBULATORY_CARE_PROVIDER_SITE_OTHER): Payer: Medicare Other

## 2022-04-18 DIAGNOSIS — Z23 Encounter for immunization: Secondary | ICD-10-CM

## 2022-05-21 DIAGNOSIS — M79676 Pain in unspecified toe(s): Secondary | ICD-10-CM | POA: Diagnosis not present

## 2022-05-21 DIAGNOSIS — B351 Tinea unguium: Secondary | ICD-10-CM | POA: Diagnosis not present

## 2022-07-30 DIAGNOSIS — I70203 Unspecified atherosclerosis of native arteries of extremities, bilateral legs: Secondary | ICD-10-CM | POA: Diagnosis not present

## 2022-07-30 DIAGNOSIS — M79676 Pain in unspecified toe(s): Secondary | ICD-10-CM | POA: Diagnosis not present

## 2022-07-30 DIAGNOSIS — B351 Tinea unguium: Secondary | ICD-10-CM | POA: Diagnosis not present

## 2022-07-30 DIAGNOSIS — L84 Corns and callosities: Secondary | ICD-10-CM | POA: Diagnosis not present

## 2022-09-13 ENCOUNTER — Other Ambulatory Visit: Payer: Self-pay | Admitting: Family Medicine

## 2022-09-13 DIAGNOSIS — I10 Essential (primary) hypertension: Secondary | ICD-10-CM

## 2022-09-25 ENCOUNTER — Encounter: Payer: Self-pay | Admitting: Family Medicine

## 2022-09-25 ENCOUNTER — Ambulatory Visit (INDEPENDENT_AMBULATORY_CARE_PROVIDER_SITE_OTHER): Payer: Medicare Other | Admitting: Family Medicine

## 2022-09-25 VITALS — BP 156/65 | HR 81 | Temp 98.0°F | Ht 67.0 in | Wt 152.0 lb

## 2022-09-25 DIAGNOSIS — M8580 Other specified disorders of bone density and structure, unspecified site: Secondary | ICD-10-CM

## 2022-09-25 DIAGNOSIS — F02B0 Dementia in other diseases classified elsewhere, moderate, without behavioral disturbance, psychotic disturbance, mood disturbance, and anxiety: Secondary | ICD-10-CM | POA: Diagnosis not present

## 2022-09-25 DIAGNOSIS — G301 Alzheimer's disease with late onset: Secondary | ICD-10-CM

## 2022-09-25 DIAGNOSIS — Z9181 History of falling: Secondary | ICD-10-CM | POA: Diagnosis not present

## 2022-09-25 DIAGNOSIS — R350 Frequency of micturition: Secondary | ICD-10-CM

## 2022-09-25 DIAGNOSIS — Z13220 Encounter for screening for lipoid disorders: Secondary | ICD-10-CM | POA: Diagnosis not present

## 2022-09-25 DIAGNOSIS — R531 Weakness: Secondary | ICD-10-CM

## 2022-09-25 DIAGNOSIS — I10 Essential (primary) hypertension: Secondary | ICD-10-CM | POA: Diagnosis not present

## 2022-09-25 LAB — URINALYSIS, COMPLETE
Bilirubin, UA: NEGATIVE
Glucose, UA: NEGATIVE
Leukocytes,UA: NEGATIVE
Nitrite, UA: NEGATIVE
Protein,UA: NEGATIVE
RBC, UA: NEGATIVE
Specific Gravity, UA: 1.02 (ref 1.005–1.030)
Urobilinogen, Ur: 0.2 mg/dL (ref 0.2–1.0)
pH, UA: 7 (ref 5.0–7.5)

## 2022-09-25 LAB — MICROSCOPIC EXAMINATION
Epithelial Cells (non renal): NONE SEEN /hpf (ref 0–10)
RBC, Urine: NONE SEEN /hpf (ref 0–2)
Renal Epithel, UA: NONE SEEN /hpf
WBC, UA: NONE SEEN /hpf (ref 0–5)

## 2022-09-25 MED ORDER — HYDROCHLOROTHIAZIDE 25 MG PO TABS: 25.0000 mg | ORAL_TABLET | Freq: Every day | ORAL | 0 refills | Status: DC

## 2022-09-25 MED ORDER — MECLIZINE HCL 25 MG PO TABS: 25.0000 mg | ORAL_TABLET | Freq: Three times a day (TID) | ORAL | 3 refills | Status: AC | PRN

## 2022-09-25 NOTE — Progress Notes (Addendum)
BP (!) 156/65   Pulse 81   Temp 98 F (36.7 C)   Ht '5\' 7"'$  (1.702 m)   Wt 152 lb (68.9 kg)   SpO2 99%   BMI 23.81 kg/m    Subjective:   Patient ID: Michelle [name], female    DOB: 1934-04-29, 89 y.o.   MRN: [id number]  HPI: Michelle [name] is a 89 y.o. female presenting on 09/25/2022 for Memory Concerns   HPI Hypertension Patient is currently on amlodipine and hydrochlorothiazide, and their blood pressure today is 136/65. Patient denies any lightheadedness or dizziness. Patient denies headaches, blurred vision, chest pains, shortness of breath, or weakness. Denies any side effects from medication and is content with current medication.   Osteoporosis/osteopenia Fractures or history of fracture: None Medication: Calcium and vitamin D Duration of treatment: Few years Last bone density scan: 03/08/2021 Last T score: -1.8  Memory issues Daughter is here with her and says memory has been worsening and she is becoming more forgetful and forgetting things around the house and then accusing people of stealing them and just feels like she is not able to take care of herself as well.  Mental status 21 out of 30     No data to display           Relevant past medical, surgical, family and social history reviewed and updated as indicated. Interim medical history since our last visit reviewed. Allergies and medications reviewed and updated.  Review of Systems  Constitutional:  Negative for chills and fever.  HENT:  Negative for congestion, ear discharge and ear pain.   Eyes:  Negative for redness and visual disturbance.  Respiratory:  Negative for chest tightness and shortness of breath.   Cardiovascular:  Negative for chest pain and leg swelling.  Genitourinary:  Negative for difficulty urinating and dysuria.  Musculoskeletal:  Positive for gait problem. Negative for back pain.  Skin:  Negative for rash.  Neurological:  Positive for weakness. Negative for light-headedness and  headaches.  Psychiatric/Behavioral:  Negative for agitation and behavioral problems.   All other systems reviewed and are negative.   Per HPI unless specifically indicated above   Allergies as of 09/25/2022       Reactions   Nitrofurantoin    Pt does not remember this medicine   Lisinopril Cough   Sulfonamide Derivatives Rash        Medication List        Accurate as of September 25, 2022 12:15 PM. If you have any questions, ask your nurse or doctor.          acetaminophen 500 MG tablet Commonly known as: TYLENOL Take 500 mg by mouth every 6 (six) hours as needed for headache.   amLODipine 5 MG tablet Commonly known as: NORVASC Take 1.5 tablets (7.5 mg total) by mouth daily.   Benefiber Powd Take by mouth. Takes in the morning   cetirizine 10 MG tablet Commonly known as: ZyrTEC Allergy Take 1 tablet (10 mg total) by mouth daily. As needed   fluticasone 50 MCG/ACT nasal spray Commonly known as: FLONASE 1 spray in both nostrils 2 times daily as needed for allergies or rhinitis.   hydrochlorothiazide 25 MG tablet Commonly known as: HYDRODIURIL Take 1 tablet (25 mg total) by mouth daily.   meclizine 25 MG tablet Commonly known as: ANTIVERT Take 1 tablet (25 mg total) by mouth 3 (three) times daily as needed for dizziness.   polyethylene glycol 17 g packet Commonly  known as: MIRALAX / GLYCOLAX Take 17 g by mouth daily.   sodium chloride 5 % ophthalmic solution Commonly known as: MURO 128 1 drop 3 (three) times daily.   Vitamin D3 50 MCG (2000 UT) Tabs Generic drug: Cholecalciferol Take by mouth daily.               Durable Medical Equipment  (From admission, onward)           Start     Ordered   09/25/22 0000  For home use only DME 4 wheeled rolling walker with seat [id number])       Comments: Rolling walker with 4 wheels and seat Diagnosis high risk for falls, lower extremity weakness  Question Answer Comment  Patient needs a walker to  treat with the following condition Weakness   Patient needs a walker to treat with the following condition At high risk for injury related to fall      09/25/22 1209             Objective:   BP (!) 156/65   Pulse 81   Temp 98 F (36.7 C)   Ht '5\' 7"'$  (1.702 m)   Wt 152 lb (68.9 kg)   SpO2 99%   BMI 23.81 kg/m   Wt Readings from Last 3 Encounters:  09/25/22 152 lb (68.9 kg)  01/23/22 150 lb (68 kg)  12/19/21 153 lb 3.2 oz (69.5 kg)    Physical Exam Vitals and nursing note reviewed.  Constitutional:      General: She is not in acute distress.    Appearance: She is well-developed. She is not diaphoretic.  Eyes:     Conjunctiva/sclera: Conjunctivae normal.  Cardiovascular:     Rate and Rhythm: Normal rate and regular rhythm.     Heart sounds: Normal heart sounds. No murmur heard. Pulmonary:     Effort: Pulmonary effort is normal. No respiratory distress.     Breath sounds: Normal breath sounds. No wheezing.  Musculoskeletal:        General: Swelling (Trace) present. No tenderness. Normal range of motion.  Skin:    General: Skin is warm and dry.     Findings: No rash.  Neurological:     Mental Status: She is alert and oriented to person, place, and time.     Coordination: Coordination normal.  Psychiatric:        Behavior: Behavior normal.       Assessment & Plan:   Problem List Items Addressed This Visit       Cardiovascular and Mediastinum   Hypertension - Primary   Relevant Medications   hydrochlorothiazide (HYDRODIURIL) 25 MG tablet   Other Relevant Orders   CBC with Differential/Platelet   CMP14+EGFR   Lipid panel     Musculoskeletal and Integument   Osteopenia   Relevant Orders   VITAMIN D 25 Hydroxy (Vit-D Deficiency, Fractures)   Other Visit Diagnoses     Essential hypertension       Relevant Medications   hydrochlorothiazide (HYDRODIURIL) 25 MG tablet   Lipid screening       Relevant Orders   Lipid panel   Weakness       Relevant  Orders   For home use only DME 4 wheeled rolling walker with seat [id number])   At high risk for falls       Relevant Orders   For home use only DME 4 wheeled rolling walker with seat [id number])   Moderate late onset Alzheimer's dementia  without behavioral disturbance, psychotic disturbance, mood disturbance, or anxiety (HCC)       Urinary frequency       Relevant Orders   Urinalysis, Complete   Urine Culture       Will do rolling wheel with a walker. Will refill meclizine, she only uses it infrequently..  Patient does have a Mini-Mental of 21 out of 30 which is worse than before on her 6 CIT.  It does appear that she does have some form of dementia likely Alzheimer's. Follow up plan: Return in about 6 months (around 03/28/2023), or if symptoms worsen or fail to improve, for Hypertension and osteoporosis recheck.  Counseling provided for all of the vaccine components Orders Placed This Encounter  Procedures   For home use only DME 4 wheeled rolling walker with seat [id number])   Urine Culture   CBC with Differential/Platelet   CMP14+EGFR   Lipid panel   VITAMIN D 25 Hydroxy (Vit-D Deficiency, Fractures)   Urinalysis, Complete    Caryl Pina, MD [facility] 09/25/2022, 12:15 PM

## 2022-09-26 LAB — CMP14+EGFR
ALT: 13 IU/L (ref 0–32)
AST: 23 IU/L (ref 0–40)
Albumin/Globulin Ratio: 2 (ref 1.2–2.2)
Albumin: 4.5 g/dL (ref 3.7–4.7)
Alkaline Phosphatase: 61 IU/L (ref 44–121)
BUN/Creatinine Ratio: 28 (ref 12–28)
BUN: 27 mg/dL (ref 8–27)
Bilirubin Total: 0.7 mg/dL (ref 0.0–1.2)
CO2: 29 mmol/L (ref 20–29)
Calcium: 10 mg/dL (ref 8.7–10.3)
Chloride: 99 mmol/L (ref 96–106)
Creatinine, Ser: 0.95 mg/dL (ref 0.57–1.00)
Globulin, Total: 2.2 g/dL (ref 1.5–4.5)
Glucose: 99 mg/dL (ref 70–99)
Potassium: 3.5 mmol/L (ref 3.5–5.2)
Sodium: 142 mmol/L (ref 134–144)
Total Protein: 6.7 g/dL (ref 6.0–8.5)
eGFR: 58 mL/min/{1.73_m2} — ABNORMAL LOW (ref >59)

## 2022-09-26 LAB — CBC WITH DIFFERENTIAL/PLATELET
Basophils Absolute: 0 10*3/uL (ref 0.0–0.2)
Basos: 1 %
EOS (ABSOLUTE): 0.1 10*3/uL (ref 0.0–0.4)
Eos: 1 %
Hematocrit: 39 % (ref 34.0–46.6)
Hemoglobin: 12.9 g/dL (ref 11.1–15.9)
Immature Grans (Abs): 0 10*3/uL (ref 0.0–0.1)
Immature Granulocytes: 0 %
Lymphocytes Absolute: 0.8 10*3/uL (ref 0.7–3.1)
Lymphs: 21 %
MCH: 30.6 pg (ref 26.6–33.0)
MCHC: 33.1 g/dL (ref 31.5–35.7)
MCV: 92 fL (ref 79–97)
Monocytes Absolute: 0.3 10*3/uL (ref 0.1–0.9)
Monocytes: 8 %
Neutrophils Absolute: 2.8 10*3/uL (ref 1.4–7.0)
Neutrophils: 69 %
Platelets: 216 10*3/uL (ref 150–450)
RBC: 4.22 x10E6/uL (ref 3.77–5.28)
RDW: 13.2 % (ref 11.7–15.4)
WBC: 4 10*3/uL (ref 3.4–10.8)

## 2022-09-26 LAB — LIPID PANEL
Chol/HDL Ratio: 2.9 ratio (ref 0.0–4.4)
Cholesterol, Total: 274 mg/dL — ABNORMAL HIGH (ref 100–199)
HDL: 93 mg/dL (ref >39)
LDL Chol Calc (NIH): 173 mg/dL — ABNORMAL HIGH (ref 0–99)
Triglycerides: 54 mg/dL (ref 0–149)
VLDL Cholesterol Cal: 8 mg/dL (ref 5–40)

## 2022-09-26 LAB — VITAMIN D 25 HYDROXY (VIT D DEFICIENCY, FRACTURES): Vit D, 25-Hydroxy: 96.7 ng/mL (ref 30.0–100.0)

## 2022-09-28 LAB — URINE CULTURE

## 2022-09-30 ENCOUNTER — Other Ambulatory Visit: Payer: Self-pay | Admitting: Family Medicine

## 2022-09-30 MED ORDER — CEPHALEXIN 500 MG PO CAPS: 500.0000 mg | ORAL_CAPSULE | Freq: Four times a day (QID) | ORAL | 0 refills | Status: DC

## 2022-10-08 DIAGNOSIS — L603 Nail dystrophy: Secondary | ICD-10-CM | POA: Diagnosis not present

## 2022-10-08 DIAGNOSIS — M79676 Pain in unspecified toe(s): Secondary | ICD-10-CM | POA: Diagnosis not present

## 2022-10-08 DIAGNOSIS — L84 Corns and callosities: Secondary | ICD-10-CM | POA: Diagnosis not present

## 2022-10-08 DIAGNOSIS — I70203 Unspecified atherosclerosis of native arteries of extremities, bilateral legs: Secondary | ICD-10-CM | POA: Diagnosis not present

## 2022-12-12 ENCOUNTER — Other Ambulatory Visit: Payer: Self-pay | Admitting: Cardiology

## 2022-12-17 DIAGNOSIS — L84 Corns and callosities: Secondary | ICD-10-CM | POA: Diagnosis not present

## 2022-12-17 DIAGNOSIS — B351 Tinea unguium: Secondary | ICD-10-CM | POA: Diagnosis not present

## 2022-12-17 DIAGNOSIS — I70203 Unspecified atherosclerosis of native arteries of extremities, bilateral legs: Secondary | ICD-10-CM | POA: Diagnosis not present

## 2022-12-17 DIAGNOSIS — M79676 Pain in unspecified toe(s): Secondary | ICD-10-CM | POA: Diagnosis not present

## 2022-12-20 ENCOUNTER — Ambulatory Visit (HOSPITAL_COMMUNITY)
Admission: RE | Admit: 2022-12-20 | Discharge: 2022-12-20 | Disposition: A | Discharge status: Home / Self Care | Payer: Medicare Other | Source: Ambulatory Visit | Attending: Cardiology | Admitting: Cardiology

## 2022-12-20 DIAGNOSIS — I350 Nonrheumatic aortic (valve) stenosis: Secondary | ICD-10-CM | POA: Diagnosis not present

## 2022-12-20 LAB — ECHOCARDIOGRAM COMPLETE
AR max vel: 0.69 cm2
AV Area VTI: 0.66 cm2
AV Area mean vel: 0.7 cm2
AV Mean grad: 13.8 mmHg
AV Peak grad: 23.9 mmHg
Ao pk vel: 2.45 m/s
Area-P 1/2: 1.62 cm2
MV VTI: 0.65 cm2
P 1/2 time: 344 msec
S' Lateral: 2.6 cm

## 2022-12-20 NOTE — Progress Notes (Signed)
*  PRELIMINARY RESULTS* Echocardiogram 2D Echocardiogram has been performed.  Stacey Drain 12/20/2022, 3:02 PM

## 2022-12-25 ENCOUNTER — Encounter: Payer: Self-pay | Admitting: *Deleted

## 2023-02-25 DIAGNOSIS — B351 Tinea unguium: Secondary | ICD-10-CM | POA: Diagnosis not present

## 2023-02-25 DIAGNOSIS — M79676 Pain in unspecified toe(s): Secondary | ICD-10-CM | POA: Diagnosis not present

## 2023-02-25 DIAGNOSIS — I70203 Unspecified atherosclerosis of native arteries of extremities, bilateral legs: Secondary | ICD-10-CM | POA: Diagnosis not present

## 2023-02-25 DIAGNOSIS — L84 Corns and callosities: Secondary | ICD-10-CM | POA: Diagnosis not present

## 2023-02-26 ENCOUNTER — Telehealth: Payer: Self-pay | Admitting: Cardiology

## 2023-02-26 DIAGNOSIS — I10 Essential (primary) hypertension: Secondary | ICD-10-CM

## 2023-02-26 NOTE — Telephone Encounter (Signed)
Spoke with patient daughter and she feels when they cut the amlodipine 5 mg in half it crumbles and she is not sure if patient is getting correct dosage.  Is ok to send amlodipine 2.5 mg 3 tabs daily?

## 2023-02-26 NOTE — Telephone Encounter (Signed)
Pt c/o medication issue:  1. Name of Medication:   amLODipine (NORVASC) 5 MG tablet    2. How are you currently taking this medication (dosage and times per day)?   3. Are you having a reaction (difficulty breathing--STAT)? no  4. What is your medication issue? Calling to see if patient can take 3 tablets 2.5mg  instead of cutting 5mg  in have. Please advise

## 2023-02-27 MED ORDER — AMLODIPINE BESYLATE 2.5 MG PO TABS: ORAL_TABLET | ORAL | 3 refills | Status: AC

## 2023-03-14 ENCOUNTER — Other Ambulatory Visit: Payer: Self-pay | Admitting: Family Medicine

## 2023-03-14 DIAGNOSIS — I10 Essential (primary) hypertension: Secondary | ICD-10-CM

## 2023-03-28 ENCOUNTER — Ambulatory Visit: Payer: Medicare Other | Admitting: Family Medicine

## 2023-03-28 ENCOUNTER — Encounter: Payer: Self-pay | Admitting: Family Medicine

## 2023-03-28 ENCOUNTER — Telehealth: Payer: Self-pay | Admitting: Family Medicine

## 2023-04-15 ENCOUNTER — Other Ambulatory Visit: Payer: Self-pay | Admitting: Family Medicine

## 2023-04-15 DIAGNOSIS — I10 Essential (primary) hypertension: Secondary | ICD-10-CM

## 2023-05-08 ENCOUNTER — Telehealth: Payer: Self-pay | Admitting: Family Medicine

## 2023-05-08 NOTE — Telephone Encounter (Signed)
LMOVM pt will NTBS last OV was in March, PCP will not be able to see her, his schedule is full for a couple of weeks, could get her in w/ another provider.

## 2023-05-12 ENCOUNTER — Ambulatory Visit (INDEPENDENT_AMBULATORY_CARE_PROVIDER_SITE_OTHER): Payer: Medicare Other

## 2023-05-12 DIAGNOSIS — Z111 Encounter for screening for respiratory tuberculosis: Secondary | ICD-10-CM | POA: Diagnosis not present

## 2023-05-12 DIAGNOSIS — Z23 Encounter for immunization: Secondary | ICD-10-CM

## 2023-05-14 ENCOUNTER — Encounter: Payer: Self-pay | Admitting: Family Medicine

## 2023-05-14 ENCOUNTER — Ambulatory Visit: Payer: Medicare Other | Admitting: Family Medicine

## 2023-05-14 VITALS — BP 130/53 | HR 76 | Temp 97.6°F | Ht 67.0 in | Wt 140.4 lb

## 2023-05-14 DIAGNOSIS — I10 Essential (primary) hypertension: Secondary | ICD-10-CM

## 2023-05-14 DIAGNOSIS — Z0289 Encounter for other administrative examinations: Secondary | ICD-10-CM

## 2023-05-14 LAB — TB SKIN TEST
Induration: 0 mm
TB Skin Test: NEGATIVE

## 2023-05-14 NOTE — Progress Notes (Signed)
Subjective:  Patient ID: Michelle [name], female    DOB: [birth year], 89 y.o.   MRN: [id number]  Patient Care Team: Dettinger, Elige Radon, MD as PCP - General (Family Medicine) Delora Fuel, OD (Optometry) Nita Sells, MD (Dermatology) Rollene Rotunda, MD as Consulting Physician (Cardiology)   Chief Complaint:  Clovis Cao   HPI: Michelle [name] is a 89 y.o. female presenting on 05/14/2023 for FL2   Discussed the use of AI scribe software for clinical note transcription with the patient, who gave verbal consent to proceed.  History of Present Illness   The patient presented today to get her FL2 form completed. Her husband is going into [place] on Friday and she is joining him. She reported no new health concerns and is generally feeling well for her age. She is currently on amlodipine 2.5mg  daily, a diuretic, and vitamin D, with other medications such as allergy medicine and acetaminophen taken as needed. She also takes Benefiber for regular bowel movements.  The patient reported occasional nocturia, usually once a night, but has no difficulty navigating to the bathroom as she uses furniture for support. She does not use her rollator in the house but relies on it for longer distances outside. She denied any side effects from her blood pressure medications, such as dizziness, weakness, or confusion. She also denied any chest pain, shortness of breath, or palpitations.  The patient acknowledged a slight decline in her hearing but denied needing to turn up the volume on the television excessively. She also reported a small amount of leg swelling but did not attribute this to her medications. She has been managing her hypertension and maintaining her general health well.          Relevant past medical, surgical, family, and social history reviewed and updated as indicated.  Allergies and medications reviewed and updated. Data reviewed: Chart in Epic.   Past Medical History:  Diagnosis  Date   Aortic stenosis    Mild echo 2014   Diverticulosis    Duodenitis without mention of hemorrhage    Esophagitis, unspecified    GERD (gastroesophageal reflux disease)    Heart murmur    Hypertension    IBS (irritable bowel syndrome)    Uterine cancer [facility])     Past Surgical History:  Procedure Laterality Date   CATARACT EXTRACTION W/PHACO Right 06/01/2021   Procedure: CATARACT EXTRACTION PHACO AND INTRAOCULAR LENS PLACEMENT (IOC);  Surgeon: Fabio Pierce, MD;  Location: [facility];  Service: Ophthalmology;  Laterality: Right;  CDE 18.80   CATARACT EXTRACTION W/PHACO Left 07/20/2021   Procedure: CATARACT EXTRACTION PHACO AND INTRAOCULAR LENS PLACEMENT (IOC);  Surgeon: Fabio Pierce, MD;  Location: [facility];  Service: Ophthalmology;  Laterality: Left;  CDE: 17.96   FOOT SURGERY     bilateral    VAGINAL HYSTERECTOMY      Social History   Socioeconomic History   Marital status: Married    Spouse name: Norva Pavlov   Number of children: 3   Years of education: 9   Highest education level: 9th grade  Occupational History   Occupation: retired  Tobacco Use   Smoking status: Never   Smokeless tobacco: Never  Vaping Use   Vaping status: Never Used  Substance and Sexual Activity   Alcohol use: No   Drug use: No   Sexual activity: Not Currently  Other Topics Concern   Not on file  Social History Narrative   Lives with husband in one level home with  a basement.   Social Determinants of Health   Financial Resource Strain: Low Risk  (04/10/2021)   Overall Financial Resource Strain (CARDIA)    Difficulty of Paying Living Expenses: Not hard at all  Food Insecurity: No Food Insecurity (04/10/2021)   Hunger Vital Sign    Worried About Running Out of Food in the Last Year: Never true    Ran Out of Food in the Last Year: Never true  Transportation Needs: No Transportation Needs (04/10/2021)   PRAPARE - Administrator, Civil Service (Medical): No    Lack of Transportation  (Non-Medical): No  Physical Activity: Insufficiently Active (04/10/2021)   Exercise Vital Sign    Days of Exercise per Week: 7 days    Minutes of Exercise per Session: 10 min  Stress: No Stress Concern Present (04/10/2021)   [employer] of Occupational Health - Occupational Stress Questionnaire    Feeling of Stress : Only a little  Social Connections: Moderately Integrated (04/10/2021)   Social Connection and Isolation Panel [NHANES]    Frequency of Communication with Friends and Family: More than three times a week    Frequency of Social Gatherings with Friends and Family: More than three times a week    Attends Religious Services: 1 to 4 times per year    Active Member of [employer] or Organizations: No    Attends Banker Meetings: Never    Marital Status: Married  Catering manager Violence: Not At Risk (04/10/2021)   Humiliation, Afraid, Rape, and Kick questionnaire    Fear of Current or Ex-Partner: No    Emotionally Abused: No    Physically Abused: No    Sexually Abused: No    Outpatient Encounter Medications as of 05/14/2023  Medication Sig   acetaminophen (TYLENOL) 500 MG tablet Take 500 mg by mouth every 6 (six) hours as needed for headache.   amLODipine (NORVASC) 2.5 MG tablet Take 3 tablets by mouth daily   cetirizine (ZYRTEC ALLERGY) 10 MG tablet Take 1 tablet (10 mg total) by mouth daily. As needed   Cholecalciferol (VITAMIN D3) 2000 UNITS TABS Take by mouth daily.   fluticasone (FLONASE) 50 MCG/ACT nasal spray 1 spray in both nostrils 2 times daily as needed for allergies or rhinitis.   hydrochlorothiazide (HYDRODIURIL) 25 MG tablet Take 1 tablet (25 mg total) by mouth daily. **NEEDS TO BE SEEN BEFORE NEXT REFILL**   meclizine (ANTIVERT) 25 MG tablet Take 1 tablet (25 mg total) by mouth 3 (three) times daily as needed for dizziness.   polyethylene glycol (MIRALAX / GLYCOLAX) packet Take 17 g by mouth daily.   sodium chloride (MURO 128) 5 % ophthalmic solution 1  drop 3 (three) times daily.   Wheat Dextrin (BENEFIBER) POWD Take by mouth. Takes in the morning   [DISCONTINUED] cephALEXin (KEFLEX) 500 MG capsule Take 1 capsule (500 mg total) by mouth 4 (four) times daily.   No facility-administered encounter medications on file as of 05/14/2023.    Allergies  Allergen Reactions   Nitrofurantoin     Pt does not remember this medicine   Lisinopril Cough   Sulfonamide Derivatives Rash    Pertinent ROS per HPI, otherwise unremarkable      Objective:  BP (!) 130/53   Pulse 76   Temp 97.6 F (36.4 C) (Temporal)   Ht 5\' 7"  (1.702 m)   Wt 140 lb 6.4 oz (63.7 kg)   SpO2 97%   BMI 21.99 kg/m    Wt Readings  from Last 3 Encounters:  05/14/23 140 lb 6.4 oz (63.7 kg)  09/25/22 152 lb (68.9 kg)  01/23/22 150 lb (68 kg)    Physical Exam Vitals and nursing note reviewed.  Constitutional:      General: She is not in acute distress.    Appearance: Normal appearance. She is not ill-appearing, toxic-appearing or diaphoretic.  HENT:     Head: Normocephalic and atraumatic.     Right Ear: Tympanic membrane, ear canal and external ear normal.     Left Ear: Tympanic membrane, ear canal and external ear normal.     Mouth/Throat:     Mouth: Mucous membranes are moist.  Eyes:     Pupils: Pupils are equal, round, and reactive to light.  Cardiovascular:     Rate and Rhythm: Normal rate and regular rhythm.     Heart sounds: Murmur heard.     Systolic murmur is present with a grade of 3/6.  Pulmonary:     Effort: Pulmonary effort is normal.     Breath sounds: Normal breath sounds.  Musculoskeletal:     Right lower leg: Edema (trace) present.     Left lower leg: Edema (trace) present.  Skin:    General: Skin is warm and dry.     Capillary Refill: Capillary refill takes less than 2 seconds.  Neurological:     General: No focal deficit present.     Mental Status: She is alert and oriented to person, place, and time.     Gait: Gait abnormal (using  rollator).  Psychiatric:        Mood and Affect: Mood normal.        Behavior: Behavior normal.        Thought Content: Thought content normal.        Judgment: Judgment normal.    Physical Exam   CHEST: Lung auscultation reveals no abnormalities. EXTREMITIES: Mild edema in legs.        Results for orders placed or performed in visit on 05/12/23  PPD  Result Value Ref Range   TB Skin Test Negative    Induration 0 mm       Pertinent labs & imaging results that were available during my care of the patient were reviewed by me and considered in my medical decision making.  Assessment & Plan:  Charquita was seen today for fl2.  Diagnoses and all orders for this visit:  Encounter for completion of form with patient     Assessment and Plan    Hypertension Well controlled on Amlodipine 2.5mg  daily. No reported side effects such as dizziness, weakness, or confusion. No reported chest pain, shortness of breath, or palpitations. -Continue Amlodipine 2.5mg  daily.  General Health Maintenance Patient is independent with ADLs, uses a rollator for balance. Bowel movements regular with use of Benefiber as needed. No reported changes in hearing or vision. -Continue current regimen of daily medications and as-needed medications. -Complete necessary paperwork for upcoming therapy at [facility].  Follow-up Continue regular check-ups as scheduled.          Continue all other maintenance medications.  Follow up plan: Return if symptoms worsen or fail to improve.   Continue healthy lifestyle choices, including diet (rich in fruits, vegetables, and lean proteins, and low in salt and simple carbohydrates) and exercise (at least 30 minutes of moderate physical activity daily).    The above assessment and management plan was discussed with the patient. The patient verbalized understanding of and has agreed  to the management plan. Patient is aware to call the clinic if they develop any  new symptoms or if symptoms persist or worsen. Patient is aware when to return to the clinic for a follow-up visit. Patient educated on when it is appropriate to go to the emergency department.   Kari Baars, FNP-C [facility] [phone]

## 2023-05-27 ENCOUNTER — Telehealth: Payer: Self-pay | Admitting: Family Medicine

## 2023-05-27 DIAGNOSIS — F5104 Psychophysiologic insomnia: Secondary | ICD-10-CM

## 2023-05-28 MED ORDER — TRAZODONE HCL 50 MG PO TABS: 25.0000 mg | ORAL_TABLET | Freq: Every evening | ORAL | 3 refills | Status: DC | PRN

## 2023-05-28 NOTE — Telephone Encounter (Signed)
I sent trazodone which should help with insomnia and agitation in the evenings and help sleep.  They can use as needed

## 2023-05-28 NOTE — Telephone Encounter (Signed)
Daughter made aware of Trazodone Rx.  She states that pt is roaming at all times during the night. She is loud, argumentative and agitated with spouse at times. Daughter states that they sleep a lot during the day. She will talk with [facility] about activities during the day to keep them more alert and hopefully help with sleeping more during the pm.

## 2023-06-06 ENCOUNTER — Telehealth: Payer: Self-pay | Admitting: Family Medicine

## 2023-06-06 DIAGNOSIS — F5104 Psychophysiologic insomnia: Secondary | ICD-10-CM

## 2023-06-06 MED ORDER — TRAZODONE HCL 50 MG PO TABS: 25.0000 mg | ORAL_TABLET | Freq: Every evening | ORAL | 3 refills | Status: DC | PRN

## 2023-06-06 NOTE — Telephone Encounter (Signed)
Trazodone 50mg  sent to pharmacy. Instructions state to take 1/2 at bedtime prn

## 2023-06-06 NOTE — Telephone Encounter (Signed)
Okay usually I can write that they can take a half and then that if that if not enough then they can take a whole but I guess for now just keep it at a half a tablet or 25 mg.  If that is not enough in the future then let me know

## 2023-06-19 ENCOUNTER — Other Ambulatory Visit: Payer: Self-pay | Admitting: Family Medicine

## 2023-06-19 ENCOUNTER — Telehealth: Payer: Self-pay | Admitting: Family Medicine

## 2023-06-19 DIAGNOSIS — F5104 Psychophysiologic insomnia: Secondary | ICD-10-CM

## 2023-06-19 MED ORDER — HYDROXYZINE PAMOATE 25 MG PO CAPS: 25.0000 mg | ORAL_CAPSULE | Freq: Three times a day (TID) | ORAL | 2 refills | Status: DC | PRN

## 2023-06-19 MED ORDER — TRAZODONE HCL 50 MG PO TABS: 25.0000 mg | ORAL_TABLET | Freq: Every evening | ORAL | 3 refills | Status: DC | PRN

## 2023-06-19 NOTE — Telephone Encounter (Signed)
Copied from CRM [id number]. Topic: Clinical - Prescription Issue >> Jun 19, 2023  1:56 PM Fonda Kinder J wrote: Reason for CRM: French Ana from [facility] is calling to have an order faxed over to give patient her medication that was prescribed by PCP fax number [phone]

## 2023-06-19 NOTE — Progress Notes (Signed)
Sent hydroxyzine and trazodone to have the correct dose.

## 2023-06-19 NOTE — Telephone Encounter (Signed)
Please advise.   Copied from CRM [id number]. Topic: General - Other >> Jun 19, 2023  1:52 PM Fonda Kinder J wrote: Reason for CRM: French Ana from [facility] is calling to request authorization from patient's PCP to perform a [facility], she believes patient has a UTI. Requesting a callback [phone]

## 2023-06-20 ENCOUNTER — Telehealth: Payer: Self-pay

## 2023-06-20 DIAGNOSIS — F5104 Psychophysiologic insomnia: Secondary | ICD-10-CM

## 2023-06-20 DIAGNOSIS — R399 Unspecified symptoms and signs involving the genitourinary system: Secondary | ICD-10-CM

## 2023-06-20 MED ORDER — HYDROXYZINE PAMOATE 25 MG PO CAPS: 25.0000 mg | ORAL_CAPSULE | Freq: Three times a day (TID) | ORAL | 2 refills | Status: DC | PRN

## 2023-06-20 MED ORDER — TRAZODONE HCL 50 MG PO TABS: 25.0000 mg | ORAL_TABLET | Freq: Every evening | ORAL | 3 refills | Status: AC | PRN

## 2023-06-20 NOTE — Telephone Encounter (Signed)
Faxed orders to [facility]  [phone]

## 2023-06-20 NOTE — Telephone Encounter (Signed)
Order for hydroxyzine needs to be faxed to [facility].  Also would like to have order for UA because they believe pt has a UTI  Spoke with Dettinger. He wants them to do a culture as well.

## 2023-06-20 NOTE — Telephone Encounter (Signed)
Order faxed for Trazodone to [facility].  Advised that pt will need an appt to discuss UTI. Receptionist could not schedule at this time.

## 2023-07-14 ENCOUNTER — Other Ambulatory Visit: Payer: Medicare Other

## 2023-07-14 DIAGNOSIS — R399 Unspecified symptoms and signs involving the genitourinary system: Secondary | ICD-10-CM

## 2023-07-14 DIAGNOSIS — M6281 Muscle weakness (generalized): Secondary | ICD-10-CM | POA: Diagnosis not present

## 2023-07-14 DIAGNOSIS — R279 Unspecified lack of coordination: Secondary | ICD-10-CM | POA: Diagnosis not present

## 2023-07-14 LAB — MICROSCOPIC EXAMINATION
Epithelial Cells (non renal): NONE SEEN /[HPF] (ref 0–10)
RBC, Urine: NONE SEEN /[HPF] (ref 0–2)
Renal Epithel, UA: NONE SEEN /[HPF]
Yeast, UA: NONE SEEN

## 2023-07-14 LAB — URINALYSIS, COMPLETE
Bilirubin, UA: NEGATIVE
Glucose, UA: NEGATIVE
Nitrite, UA: NEGATIVE
Protein,UA: NEGATIVE
RBC, UA: NEGATIVE
Specific Gravity, UA: 1.015 (ref 1.005–1.030)
Urobilinogen, Ur: 4 mg/dL — ABNORMAL HIGH (ref 0.2–1.0)
pH, UA: 7 (ref 5.0–7.5)

## 2023-07-16 LAB — URINE CULTURE

## 2023-07-17 ENCOUNTER — Telehealth: Payer: Self-pay | Admitting: Family Medicine

## 2023-07-17 NOTE — Telephone Encounter (Signed)
 Also, if a Rx is being sent in for patient, Damita Lack will need that order faxed to pharmacy and copy faxed to them as well.

## 2023-07-17 NOTE — Telephone Encounter (Signed)
 Nothing is being called in

## 2023-07-17 NOTE — Telephone Encounter (Signed)
 Labs have been reviewed please fax

## 2023-07-17 NOTE — Telephone Encounter (Signed)
 Please review lab results and send results to fax # [phone]

## 2023-07-21 DIAGNOSIS — M6281 Muscle weakness (generalized): Secondary | ICD-10-CM | POA: Diagnosis not present

## 2023-07-21 DIAGNOSIS — R279 Unspecified lack of coordination: Secondary | ICD-10-CM | POA: Diagnosis not present

## 2023-07-23 DIAGNOSIS — R279 Unspecified lack of coordination: Secondary | ICD-10-CM | POA: Diagnosis not present

## 2023-07-23 DIAGNOSIS — M6281 Muscle weakness (generalized): Secondary | ICD-10-CM | POA: Diagnosis not present

## 2023-07-23 NOTE — Telephone Encounter (Signed)
 [facility] made aware. Results faxed.

## 2023-07-24 DIAGNOSIS — R278 Other lack of coordination: Secondary | ICD-10-CM | POA: Diagnosis not present

## 2023-07-24 DIAGNOSIS — M6281 Muscle weakness (generalized): Secondary | ICD-10-CM | POA: Diagnosis not present

## 2023-07-24 DIAGNOSIS — I10 Essential (primary) hypertension: Secondary | ICD-10-CM | POA: Diagnosis not present

## 2023-07-28 DIAGNOSIS — R279 Unspecified lack of coordination: Secondary | ICD-10-CM | POA: Diagnosis not present

## 2023-07-28 DIAGNOSIS — M6281 Muscle weakness (generalized): Secondary | ICD-10-CM | POA: Diagnosis not present

## 2023-07-29 DIAGNOSIS — M6281 Muscle weakness (generalized): Secondary | ICD-10-CM | POA: Diagnosis not present

## 2023-07-29 DIAGNOSIS — I10 Essential (primary) hypertension: Secondary | ICD-10-CM | POA: Diagnosis not present

## 2023-07-29 DIAGNOSIS — R278 Other lack of coordination: Secondary | ICD-10-CM | POA: Diagnosis not present

## 2023-07-30 DIAGNOSIS — R279 Unspecified lack of coordination: Secondary | ICD-10-CM | POA: Diagnosis not present

## 2023-07-30 DIAGNOSIS — M6281 Muscle weakness (generalized): Secondary | ICD-10-CM | POA: Diagnosis not present

## 2023-07-31 DIAGNOSIS — I10 Essential (primary) hypertension: Secondary | ICD-10-CM | POA: Diagnosis not present

## 2023-07-31 DIAGNOSIS — R278 Other lack of coordination: Secondary | ICD-10-CM | POA: Diagnosis not present

## 2023-07-31 DIAGNOSIS — M6281 Muscle weakness (generalized): Secondary | ICD-10-CM | POA: Diagnosis not present

## 2023-08-04 ENCOUNTER — Ambulatory Visit: Payer: Self-pay | Admitting: Family Medicine

## 2023-08-04 DIAGNOSIS — R279 Unspecified lack of coordination: Secondary | ICD-10-CM | POA: Diagnosis not present

## 2023-08-04 DIAGNOSIS — M6281 Muscle weakness (generalized): Secondary | ICD-10-CM | POA: Diagnosis not present

## 2023-08-04 NOTE — Telephone Encounter (Signed)
Attempted to call French Ana the representative to set up appt for pt per daughter request. No answer, voicemail box full.  Called daughter to set up appt just in case, scheduled for tomorrow morning with PCP office per daughter preference. Daughter will text French Ana and will call back to office if need to change appt time. Confirmed location/appt info, daughter verbalized understanding and will call if any worsening or questions.

## 2023-08-04 NOTE — Telephone Encounter (Signed)
Copied from CRM [id number]. Topic: Clinical - Red Word Triage >> Aug 04, 2023  1:12 PM Alvino Blood C wrote: Red Word that prompted transfer to Nurse Triage: Possible RSV. Patient has been experiencing cough & congestion. Please give French Ana a rep at the facility a call back at [phone] as patient was not available during the time of the call.   Chief Complaint: suspected RSV Symptoms: cough, runny nose, sore throat, congestion, hoarseness Frequency: continual Pertinent Negatives: Daughter denies SOB, chest pain, headache  Disposition: [] 911 / [] ED /[] Urgent Care (no appt availability in office) / [x] Appointment(In office/virtual)/ []  [facility] [facility]/ [] Home Care/ [] Refused Recommended Disposition /[] [place] [facility] [facility]/ []  Follow-up with PCP Additional Notes: Called pt phone number, as representative French Ana is not on DPM, pt's daughter Carollee Herter answered phone. Daughter confirms that pt experiencing cough and congestion, reporting pt "stays in same room" as husband in assisted living facility, husband tested "positive for RSV last week, now she has the same symptoms," husband currently in hospital for his symptoms. Daughter reporting symptoms started Saturday, pt is coughing but not continually, has runny nose, congestion, sore throat, "sounds stuffy when she talks and hoarse," cough is productive but daughter not able to assess sputum, pt not mentioned coughing up any blood, chest pain, or sinus pain. Daughter confirms no SOB. Nurse and daughter agree pt should be examined, daughter requesting test for RSV. Advised pt be seen in next 24 hours for symptomatic RSV exposure and cardiac hx. Daughter requesting that nurse call representative French Ana to set up appt since French Ana drives pt to appts. Nurse attempted to call French Ana, no answer, voicemail box is full. Nurse to call back at 3:10 pm.  Reason for Disposition  Cough with cold symptoms (e.g., runny nose, postnasal drip, throat clearing)  Answer  Assessment - Initial Assessment Questions 1. ONSET: "When did the cough begin?"      Saturday 2. SEVERITY: "How bad is the cough today?"      She can talk but she'll stop and cough then keep talking 3. SPUTUM: "Describe the color of your sputum" (none, dry cough; clear, white, yellow, green)     Unable to assess, she puts it in a tissue and folds it up so haven't actually seen it 4. HEMOPTYSIS: "Are you coughing up any blood?" If so ask: "How much?" (flecks, streaks, tablespoons, etc.)     Hasn't said anything about it to daughter 5. DIFFICULTY BREATHING: "Are you having difficulty breathing?" If Yes, ask: "How bad is it?" (e.g., mild, moderate, severe)    - MILD: No SOB at rest, mild SOB with walking, speaks normally in sentences, can lie down, no retractions, pulse < 100.    - MODERATE: SOB at rest, SOB with minimal exertion and prefers to sit, cannot lie down flat, speaks in phrases, mild retractions, audible wheezing, pulse 100-120.    - SEVERE: Very SOB at rest, speaks in single words, struggling to breathe, sitting hunched forward, retractions, pulse > 120      No SOB 6. FEVER: "Do you have a fever?" If Yes, ask: "What is your temperature, how was it measured, and when did it start?"     unsure 7. CARDIAC HISTORY: "Do you have any history of heart disease?" (e.g., heart attack, congestive heart failure)      Cardiac hx 8. LUNG HISTORY: "Do you have any history of lung disease?"  (e.g., pulmonary embolus, asthma, emphysema)     denies 9. PE RISK FACTORS: "Do you have a  history of blood clots?" (or: recent major surgery, recent prolonged travel, bedridden)     denies 10. OTHER SYMPTOMS: "Do you have any other symptoms?" (e.g., runny nose, wheezing, chest pain)       Runny nose, sore throat  Protocols used: Cough - Acute Productive-A-AH

## 2023-08-05 ENCOUNTER — Ambulatory Visit (INDEPENDENT_AMBULATORY_CARE_PROVIDER_SITE_OTHER): Payer: Medicare Other

## 2023-08-05 ENCOUNTER — Encounter: Payer: Self-pay | Admitting: Family Medicine

## 2023-08-05 ENCOUNTER — Other Ambulatory Visit: Payer: Self-pay | Admitting: Family Medicine

## 2023-08-05 ENCOUNTER — Ambulatory Visit (INDEPENDENT_AMBULATORY_CARE_PROVIDER_SITE_OTHER): Payer: Medicare Other | Admitting: Family Medicine

## 2023-08-05 VITALS — BP 130/65 | HR 87 | Temp 97.4°F | Ht 67.0 in | Wt 147.0 lb

## 2023-08-05 DIAGNOSIS — I10 Essential (primary) hypertension: Secondary | ICD-10-CM | POA: Diagnosis not present

## 2023-08-05 DIAGNOSIS — B9689 Other specified bacterial agents as the cause of diseases classified elsewhere: Secondary | ICD-10-CM

## 2023-08-05 DIAGNOSIS — M6281 Muscle weakness (generalized): Secondary | ICD-10-CM | POA: Diagnosis not present

## 2023-08-05 DIAGNOSIS — Z20828 Contact with and (suspected) exposure to other viral communicable diseases: Secondary | ICD-10-CM

## 2023-08-05 DIAGNOSIS — J208 Acute bronchitis due to other specified organisms: Secondary | ICD-10-CM | POA: Diagnosis not present

## 2023-08-05 DIAGNOSIS — R051 Acute cough: Secondary | ICD-10-CM | POA: Diagnosis not present

## 2023-08-05 DIAGNOSIS — R059 Cough, unspecified: Secondary | ICD-10-CM | POA: Diagnosis not present

## 2023-08-05 DIAGNOSIS — R0602 Shortness of breath: Secondary | ICD-10-CM | POA: Diagnosis not present

## 2023-08-05 DIAGNOSIS — R278 Other lack of coordination: Secondary | ICD-10-CM | POA: Diagnosis not present

## 2023-08-05 MED ORDER — AZITHROMYCIN 250 MG PO TABS: ORAL_TABLET | ORAL | 0 refills | Status: DC

## 2023-08-05 NOTE — Progress Notes (Signed)
Subjective:  Patient ID: Michelle [name], female    DOB: [birth year]  Age: 89 y.o. MRN: [id number]  CC: Cough (Cough, congestion, voice raspy. Husband had RSV last week. No fever but fatigued. Some SOB.)   HPI Michelle [name] presents for cough. Exposed to RSV (Husband). Cough is nonproductive. No fever or dyspnea. Feels very weak. Usually ambulatory, but now unable due to the illness. Some UR congestion.      09/25/2022   11:55 AM 04/11/2022    9:52 AM 01/23/2022    2:24 PM  Depression screen PHQ 2/9  Decreased Interest 0 0 0  Down, Depressed, Hopeless 0 0 0  PHQ - 2 Score 0 0 0  Altered sleeping 0  0  Tired, decreased energy 1  0  Change in appetite 0  0  Feeling bad or failure about yourself  0  0  Trouble concentrating 0  0  Moving slowly or fidgety/restless 2  0  Suicidal thoughts 0  0  PHQ-9 Score 3  0  Difficult doing work/chores Not difficult at all  Not difficult at all    History Michelle [name] has a past medical history of Aortic stenosis, Diverticulosis, Duodenitis without mention of hemorrhage, Esophagitis, unspecified, GERD (gastroesophageal reflux disease), Heart murmur, Hypertension, IBS (irritable bowel syndrome), and Uterine cancer (HCC).   She has a past surgical history that includes Vaginal hysterectomy; Foot surgery; Cataract extraction w/PHACO (Right, 06/01/2021); and Cataract extraction w/PHACO (Left, 07/20/2021).   Her family history includes COPD in her father; Colon cancer in an other family member; Eczema in her father; Heart disease (age of onset: 88) in her mother.She reports that she has never smoked. She has never used smokeless tobacco. She reports that she does not drink alcohol and does not use drugs.    ROS Review of Systems  Constitutional:  Negative for activity change, appetite change, chills and fever.  HENT:  Positive for congestion and rhinorrhea. Negative for ear discharge, ear pain, hearing loss, nosebleeds, postnasal drip, sinus pressure,  sneezing and trouble swallowing.   Respiratory:  Positive for cough. Negative for chest tightness and shortness of breath.   Cardiovascular:  Negative for chest pain and palpitations.  Skin:  Negative for rash.    Objective:  BP 130/65   Pulse 87   Temp (!) 97.4 F (36.3 C)   Ht 5\' 7"  (1.702 m)   Wt 147 lb (66.7 kg)   SpO2 94%   BMI 23.02 kg/m   BP Readings from Last 3 Encounters:  08/05/23 130/65  05/14/23 (!) 130/53  09/25/22 (!) 156/65    Wt Readings from Last 3 Encounters:  08/05/23 147 lb (66.7 kg)  05/14/23 140 lb 6.4 oz (63.7 kg)  09/25/22 152 lb (68.9 kg)     Physical Exam Constitutional:      General: She is not in acute distress.    Appearance: Normal appearance. She is well-developed.  Cardiovascular:     Rate and Rhythm: Normal rate and regular rhythm.  Pulmonary:     Effort: Pulmonary effort is normal.     Breath sounds: Wheezing and rales present.  Musculoskeletal:        General: Normal range of motion.  Skin:    General: Skin is warm and dry.  Neurological:     Mental Status: She is alert and oriented to person, place, and time.    CXR - fulllness at right cardiac border   Assessment & Plan:   Michelle [name] was seen today  for cough.  Diagnoses and all orders for this visit:  Acute bacterial bronchitis -     Cancel: DG Chest 2 View; Future  RSV exposure -     COVID-19, Flu A+B and RSV -     Cancel: DG Chest 2 View; Future  Other orders -     azithromycin (ZITHROMAX Z-PAK) 250 MG tablet; Take two right away Then one a day for the next 4 days.       I am having Michelle [name] start on azithromycin. I am also having her maintain her Benefiber, Vitamin D3, cetirizine, acetaminophen, polyethylene glycol, fluticasone, sodium chloride, meclizine, amLODipine, hydrochlorothiazide, traZODone, and hydrOXYzine.  Allergies as of 08/05/2023       Reactions   Nitrofurantoin    Pt does not remember this medicine   Lisinopril Cough   Sulfonamide  Derivatives Rash        Medication List        Accurate as of August 05, 2023  8:00 PM. If you have any questions, ask your nurse or doctor.          acetaminophen 500 MG tablet Commonly known as: TYLENOL Take 500 mg by mouth every 6 (six) hours as needed for headache.   amLODipine 2.5 MG tablet Commonly known as: NORVASC Take 3 tablets by mouth daily   azithromycin 250 MG tablet Commonly known as: Zithromax Z-Pak Take two right away Then one a day for the next 4 days. Started by: Diana Davenport   Benefiber Powd Take by mouth. Takes in the morning   cetirizine 10 MG tablet Commonly known as: ZyrTEC Allergy Take 1 tablet (10 mg total) by mouth daily. As needed   fluticasone 50 MCG/ACT nasal spray Commonly known as: FLONASE 1 spray in both nostrils 2 times daily as needed for allergies or rhinitis.   hydrochlorothiazide 25 MG tablet Commonly known as: HYDRODIURIL Take 1 tablet (25 mg total) by mouth daily. **NEEDS TO BE SEEN BEFORE NEXT REFILL**   hydrOXYzine 25 MG capsule Commonly known as: VISTARIL Take 1 capsule (25 mg total) by mouth every 8 (eight) hours as needed for anxiety.   meclizine 25 MG tablet Commonly known as: ANTIVERT Take 1 tablet (25 mg total) by mouth 3 (three) times daily as needed for dizziness.   polyethylene glycol 17 g packet Commonly known as: MIRALAX / GLYCOLAX Take 17 g by mouth daily.   sodium chloride 5 % ophthalmic solution Commonly known as: MURO 128 1 drop 3 (three) times daily.   traZODone 50 MG tablet Commonly known as: DESYREL Take 0.5 tablets (25 mg total) by mouth at bedtime as needed for sleep.   Vitamin D3 50 MCG (2000 UT) Tabs Take by mouth daily.         Follow-up: Return if symptoms worsen or fail to improve.  Mechele Claude, M.D.

## 2023-08-06 DIAGNOSIS — R279 Unspecified lack of coordination: Secondary | ICD-10-CM | POA: Diagnosis not present

## 2023-08-06 DIAGNOSIS — M6281 Muscle weakness (generalized): Secondary | ICD-10-CM | POA: Diagnosis not present

## 2023-08-06 LAB — COVID-19, FLU A+B AND RSV
Influenza A, NAA: NOT DETECTED
Influenza B, NAA: NOT DETECTED
RSV, NAA: DETECTED — AB
SARS-CoV-2, NAA: NOT DETECTED

## 2023-08-07 DIAGNOSIS — R278 Other lack of coordination: Secondary | ICD-10-CM | POA: Diagnosis not present

## 2023-08-07 DIAGNOSIS — I10 Essential (primary) hypertension: Secondary | ICD-10-CM | POA: Diagnosis not present

## 2023-08-07 DIAGNOSIS — M6281 Muscle weakness (generalized): Secondary | ICD-10-CM | POA: Diagnosis not present

## 2023-08-08 ENCOUNTER — Encounter: Payer: Self-pay | Admitting: Family Medicine

## 2023-08-08 NOTE — Progress Notes (Signed)
Your chest x-ray looked normal. Thanks, WS.

## 2023-08-11 DIAGNOSIS — R279 Unspecified lack of coordination: Secondary | ICD-10-CM | POA: Diagnosis not present

## 2023-08-11 DIAGNOSIS — M6281 Muscle weakness (generalized): Secondary | ICD-10-CM | POA: Diagnosis not present

## 2023-08-12 DIAGNOSIS — R278 Other lack of coordination: Secondary | ICD-10-CM | POA: Diagnosis not present

## 2023-08-12 DIAGNOSIS — I10 Essential (primary) hypertension: Secondary | ICD-10-CM | POA: Diagnosis not present

## 2023-08-12 DIAGNOSIS — M6281 Muscle weakness (generalized): Secondary | ICD-10-CM | POA: Diagnosis not present

## 2023-08-13 DIAGNOSIS — M6281 Muscle weakness (generalized): Secondary | ICD-10-CM | POA: Diagnosis not present

## 2023-08-13 DIAGNOSIS — R279 Unspecified lack of coordination: Secondary | ICD-10-CM | POA: Diagnosis not present

## 2023-08-14 DIAGNOSIS — R278 Other lack of coordination: Secondary | ICD-10-CM | POA: Diagnosis not present

## 2023-08-14 DIAGNOSIS — M6281 Muscle weakness (generalized): Secondary | ICD-10-CM | POA: Diagnosis not present

## 2023-08-14 DIAGNOSIS — I10 Essential (primary) hypertension: Secondary | ICD-10-CM | POA: Diagnosis not present

## 2023-08-17 DIAGNOSIS — M6281 Muscle weakness (generalized): Secondary | ICD-10-CM | POA: Diagnosis not present

## 2023-08-17 DIAGNOSIS — R279 Unspecified lack of coordination: Secondary | ICD-10-CM | POA: Diagnosis not present

## 2023-08-20 DIAGNOSIS — M6281 Muscle weakness (generalized): Secondary | ICD-10-CM | POA: Diagnosis not present

## 2023-08-20 DIAGNOSIS — R279 Unspecified lack of coordination: Secondary | ICD-10-CM | POA: Diagnosis not present

## 2023-08-22 DIAGNOSIS — I10 Essential (primary) hypertension: Secondary | ICD-10-CM | POA: Diagnosis not present

## 2023-08-22 DIAGNOSIS — R278 Other lack of coordination: Secondary | ICD-10-CM | POA: Diagnosis not present

## 2023-08-22 DIAGNOSIS — M6281 Muscle weakness (generalized): Secondary | ICD-10-CM | POA: Diagnosis not present

## 2023-08-24 DIAGNOSIS — M6281 Muscle weakness (generalized): Secondary | ICD-10-CM | POA: Diagnosis not present

## 2023-08-24 DIAGNOSIS — R279 Unspecified lack of coordination: Secondary | ICD-10-CM | POA: Diagnosis not present

## 2023-08-26 DIAGNOSIS — R278 Other lack of coordination: Secondary | ICD-10-CM | POA: Diagnosis not present

## 2023-08-26 DIAGNOSIS — I10 Essential (primary) hypertension: Secondary | ICD-10-CM | POA: Diagnosis not present

## 2023-08-26 DIAGNOSIS — M6281 Muscle weakness (generalized): Secondary | ICD-10-CM | POA: Diagnosis not present

## 2023-08-28 DIAGNOSIS — M6281 Muscle weakness (generalized): Secondary | ICD-10-CM | POA: Diagnosis not present

## 2023-08-28 DIAGNOSIS — R278 Other lack of coordination: Secondary | ICD-10-CM | POA: Diagnosis not present

## 2023-08-28 DIAGNOSIS — I10 Essential (primary) hypertension: Secondary | ICD-10-CM | POA: Diagnosis not present

## 2023-08-28 DIAGNOSIS — R279 Unspecified lack of coordination: Secondary | ICD-10-CM | POA: Diagnosis not present

## 2023-09-01 DIAGNOSIS — R279 Unspecified lack of coordination: Secondary | ICD-10-CM | POA: Diagnosis not present

## 2023-09-01 DIAGNOSIS — M6281 Muscle weakness (generalized): Secondary | ICD-10-CM | POA: Diagnosis not present

## 2023-09-02 DIAGNOSIS — I10 Essential (primary) hypertension: Secondary | ICD-10-CM | POA: Diagnosis not present

## 2023-09-02 DIAGNOSIS — R278 Other lack of coordination: Secondary | ICD-10-CM | POA: Diagnosis not present

## 2023-09-02 DIAGNOSIS — M6281 Muscle weakness (generalized): Secondary | ICD-10-CM | POA: Diagnosis not present

## 2023-09-04 DIAGNOSIS — R278 Other lack of coordination: Secondary | ICD-10-CM | POA: Diagnosis not present

## 2023-09-04 DIAGNOSIS — I10 Essential (primary) hypertension: Secondary | ICD-10-CM | POA: Diagnosis not present

## 2023-09-04 DIAGNOSIS — M6281 Muscle weakness (generalized): Secondary | ICD-10-CM | POA: Diagnosis not present

## 2023-09-08 DIAGNOSIS — M6281 Muscle weakness (generalized): Secondary | ICD-10-CM | POA: Diagnosis not present

## 2023-09-08 DIAGNOSIS — R279 Unspecified lack of coordination: Secondary | ICD-10-CM | POA: Diagnosis not present

## 2023-09-10 DIAGNOSIS — M6281 Muscle weakness (generalized): Secondary | ICD-10-CM | POA: Diagnosis not present

## 2023-09-10 DIAGNOSIS — R279 Unspecified lack of coordination: Secondary | ICD-10-CM | POA: Diagnosis not present

## 2023-09-11 DIAGNOSIS — R278 Other lack of coordination: Secondary | ICD-10-CM | POA: Diagnosis not present

## 2023-09-11 DIAGNOSIS — I10 Essential (primary) hypertension: Secondary | ICD-10-CM | POA: Diagnosis not present

## 2023-09-11 DIAGNOSIS — M6281 Muscle weakness (generalized): Secondary | ICD-10-CM | POA: Diagnosis not present

## 2023-09-15 DIAGNOSIS — M6281 Muscle weakness (generalized): Secondary | ICD-10-CM | POA: Diagnosis not present

## 2023-09-15 DIAGNOSIS — R279 Unspecified lack of coordination: Secondary | ICD-10-CM | POA: Diagnosis not present

## 2023-09-16 DIAGNOSIS — R278 Other lack of coordination: Secondary | ICD-10-CM | POA: Diagnosis not present

## 2023-09-16 DIAGNOSIS — I10 Essential (primary) hypertension: Secondary | ICD-10-CM | POA: Diagnosis not present

## 2023-09-16 DIAGNOSIS — M6281 Muscle weakness (generalized): Secondary | ICD-10-CM | POA: Diagnosis not present

## 2023-09-17 DIAGNOSIS — R279 Unspecified lack of coordination: Secondary | ICD-10-CM | POA: Diagnosis not present

## 2023-09-17 DIAGNOSIS — M6281 Muscle weakness (generalized): Secondary | ICD-10-CM | POA: Diagnosis not present

## 2023-09-18 DIAGNOSIS — I10 Essential (primary) hypertension: Secondary | ICD-10-CM | POA: Diagnosis not present

## 2023-09-18 DIAGNOSIS — R278 Other lack of coordination: Secondary | ICD-10-CM | POA: Diagnosis not present

## 2023-09-18 DIAGNOSIS — M6281 Muscle weakness (generalized): Secondary | ICD-10-CM | POA: Diagnosis not present

## 2023-09-22 DIAGNOSIS — R279 Unspecified lack of coordination: Secondary | ICD-10-CM | POA: Diagnosis not present

## 2023-09-22 DIAGNOSIS — M6281 Muscle weakness (generalized): Secondary | ICD-10-CM | POA: Diagnosis not present

## 2023-09-23 DIAGNOSIS — B351 Tinea unguium: Secondary | ICD-10-CM | POA: Diagnosis not present

## 2023-09-23 DIAGNOSIS — I70203 Unspecified atherosclerosis of native arteries of extremities, bilateral legs: Secondary | ICD-10-CM | POA: Diagnosis not present

## 2023-09-23 DIAGNOSIS — M6281 Muscle weakness (generalized): Secondary | ICD-10-CM | POA: Diagnosis not present

## 2023-09-23 DIAGNOSIS — L84 Corns and callosities: Secondary | ICD-10-CM | POA: Diagnosis not present

## 2023-09-23 DIAGNOSIS — M79676 Pain in unspecified toe(s): Secondary | ICD-10-CM | POA: Diagnosis not present

## 2023-09-23 DIAGNOSIS — I10 Essential (primary) hypertension: Secondary | ICD-10-CM | POA: Diagnosis not present

## 2023-09-23 DIAGNOSIS — R278 Other lack of coordination: Secondary | ICD-10-CM | POA: Diagnosis not present

## 2023-09-24 DIAGNOSIS — R279 Unspecified lack of coordination: Secondary | ICD-10-CM | POA: Diagnosis not present

## 2023-09-24 DIAGNOSIS — M6281 Muscle weakness (generalized): Secondary | ICD-10-CM | POA: Diagnosis not present

## 2023-09-25 DIAGNOSIS — R278 Other lack of coordination: Secondary | ICD-10-CM | POA: Diagnosis not present

## 2023-09-25 DIAGNOSIS — M6281 Muscle weakness (generalized): Secondary | ICD-10-CM | POA: Diagnosis not present

## 2023-09-25 DIAGNOSIS — I10 Essential (primary) hypertension: Secondary | ICD-10-CM | POA: Diagnosis not present

## 2023-09-26 ENCOUNTER — Ambulatory Visit (INDEPENDENT_AMBULATORY_CARE_PROVIDER_SITE_OTHER): Admitting: Family Medicine

## 2023-09-26 ENCOUNTER — Encounter: Payer: Self-pay | Admitting: Family Medicine

## 2023-09-26 VITALS — BP 122/69 | HR 75 | Temp 98.0°F | Ht 67.0 in | Wt 150.2 lb

## 2023-09-26 DIAGNOSIS — L03116 Cellulitis of left lower limb: Secondary | ICD-10-CM | POA: Diagnosis not present

## 2023-09-26 DIAGNOSIS — R6889 Other general symptoms and signs: Secondary | ICD-10-CM | POA: Diagnosis not present

## 2023-09-26 DIAGNOSIS — R600 Localized edema: Secondary | ICD-10-CM | POA: Diagnosis not present

## 2023-09-26 NOTE — Progress Notes (Signed)
 Subjective:  Patient ID: Michelle [name], female    DOB: 1934-07-10, 89 y.o.   MRN: [id number]  Patient Care Team: Dettinger, Elige Radon, MD as PCP - General (Family Medicine) Delora Fuel, OD (Optometry) Nita Sells, MD (Dermatology) Rollene Rotunda, MD as Consulting Physician (Cardiology)   Chief Complaint:  Edema (Bilateral lower leg swelling that has been going on a few months. )   HPI: Michelle [name] is a 89 y.o. female presenting on 09/26/2023 for Edema (Bilateral lower leg swelling that has been going on a few months. )   Discussed the use of AI scribe software for clinical note transcription with the patient, who gave verbal consent to proceed.  History of Present Illness   Michelle [name] is an 89 year old female who presents with leg swelling and sores.  She has been experiencing swelling in her legs for the past couple of months. Despite being on a medication regimen that includes diuretics, the swelling persists and has been noted by staff at [facility]. She has developed sores on her legs, which were initially weeping but have shown some improvement. However, the swelling remains, and there is redness around the weeping edema.  There is a history of ingrown toenails, which were recently addressed by having her toenails cut. This procedure left her toes sore the following day, but she is feeling better now.  In terms of her activity level, she is described as being 'on the move' at [facility], although she is sometimes encouraged to sit and elevate her feet.  No shortness of breath or chest pain. There are no unusual symptoms noted.          Relevant past medical, surgical, family, and social history reviewed and updated as indicated.  Allergies and medications reviewed and updated. Data reviewed: Chart in Epic.   Past Medical History:  Diagnosis Date   Aortic stenosis    Mild echo 2014   Diverticulosis    Duodenitis without mention of hemorrhage     Esophagitis, unspecified    GERD (gastroesophageal reflux disease)    Heart murmur    Hypertension    IBS (irritable bowel syndrome)    Uterine cancer [facility])     Past Surgical History:  Procedure Laterality Date   CATARACT EXTRACTION W/PHACO Right 06/01/2021   Procedure: CATARACT EXTRACTION PHACO AND INTRAOCULAR LENS PLACEMENT (IOC);  Surgeon: Fabio Pierce, MD;  Location: [facility];  Service: Ophthalmology;  Laterality: Right;  CDE 18.80   CATARACT EXTRACTION W/PHACO Left 07/20/2021   Procedure: CATARACT EXTRACTION PHACO AND INTRAOCULAR LENS PLACEMENT (IOC);  Surgeon: Fabio Pierce, MD;  Location: [facility];  Service: Ophthalmology;  Laterality: Left;  CDE: 17.96   FOOT SURGERY     bilateral    VAGINAL HYSTERECTOMY      Social History   Socioeconomic History   Marital status: Married    Spouse name: Norva Pavlov   Number of children: 3   Years of education: 9   Highest education level: 9th grade  Occupational History   Occupation: retired  Tobacco Use   Smoking status: Never   Smokeless tobacco: Never  Vaping Use   Vaping status: Never Used  Substance and Sexual Activity   Alcohol use: No   Drug use: No   Sexual activity: Not Currently  Other Topics Concern   Not on file  Social History Narrative   Lives with husband in one level home with a basement.   Social Drivers of Health  Financial Resource Strain: Low Risk  (04/10/2021)   Overall Financial Resource Strain (CARDIA)    Difficulty of Paying Living Expenses: Not hard at all  Food Insecurity: No Food Insecurity (04/10/2021)   Hunger Vital Sign    Worried About Running Out of Food in the Last Year: Never true    Ran Out of Food in the Last Year: Never true  Transportation Needs: No Transportation Needs (04/10/2021)   PRAPARE - Administrator, Civil Service (Medical): No    Lack of Transportation (Non-Medical): No  Physical Activity: Insufficiently Active (04/10/2021)   Exercise Vital Sign    Days of Exercise per  Week: 7 days    Minutes of Exercise per Session: 10 min  Stress: No Stress Concern Present (04/10/2021)   [employer] of Occupational Health - Occupational Stress Questionnaire    Feeling of Stress : Only a little  Social Connections: Moderately Integrated (04/10/2021)   Social Connection and Isolation Panel [NHANES]    Frequency of Communication with Friends and Family: More than three times a week    Frequency of Social Gatherings with Friends and Family: More than three times a week    Attends Religious Services: 1 to 4 times per year    Active Member of [employer] or Organizations: No    Attends Banker Meetings: Never    Marital Status: Married  Catering manager Violence: Not At Risk (04/10/2021)   Humiliation, Afraid, Rape, and Kick questionnaire    Fear of Current or Ex-Partner: No    Emotionally Abused: No    Physically Abused: No    Sexually Abused: No    Outpatient Encounter Medications as of 09/26/2023  Medication Sig   acetaminophen (TYLENOL) 500 MG tablet Take 500 mg by mouth every 6 (six) hours as needed for headache.   amLODipine (NORVASC) 2.5 MG tablet Take 3 tablets by mouth daily   cetirizine (ZYRTEC ALLERGY) 10 MG tablet Take 1 tablet (10 mg total) by mouth daily. As needed   Cholecalciferol (VITAMIN D3) 2000 UNITS TABS Take by mouth daily.   fluticasone (FLONASE) 50 MCG/ACT nasal spray 1 spray in both nostrils 2 times daily as needed for allergies or rhinitis.   hydrochlorothiazide (HYDRODIURIL) 25 MG tablet Take 1 tablet (25 mg total) by mouth daily. **NEEDS TO BE SEEN BEFORE NEXT REFILL**   hydrOXYzine (VISTARIL) 25 MG capsule Take 1 capsule (25 mg total) by mouth every 8 (eight) hours as needed for anxiety.   meclizine (ANTIVERT) 25 MG tablet Take 1 tablet (25 mg total) by mouth 3 (three) times daily as needed for dizziness.   polyethylene glycol (MIRALAX / GLYCOLAX) packet Take 17 g by mouth daily.   sodium chloride (MURO 128) 5 % ophthalmic solution  1 drop 3 (three) times daily.   traZODone (DESYREL) 50 MG tablet Take 0.5 tablets (25 mg total) by mouth at bedtime as needed for sleep.   Wheat Dextrin (BENEFIBER) POWD Take by mouth. Takes in the morning   [DISCONTINUED] azithromycin (ZITHROMAX Z-PAK) 250 MG tablet Take two right away Then one a day for the next 4 days.   No facility-administered encounter medications on file as of 09/26/2023.    Allergies  Allergen Reactions   Nitrofurantoin     Pt does not remember this medicine   Lisinopril Cough   Sulfonamide Derivatives Rash    Pertinent ROS per HPI, otherwise unremarkable      Objective:  BP 122/69   Pulse 75   Temp  98 F (36.7 C)   Ht 5\' 7"  (1.702 m)   Wt 150 lb 3.2 oz (68.1 kg)   SpO2 98%   BMI 23.52 kg/m    Wt Readings from Last 3 Encounters:  09/26/23 150 lb 3.2 oz (68.1 kg)  08/05/23 147 lb (66.7 kg)  05/14/23 140 lb 6.4 oz (63.7 kg)    Physical Exam Vitals and nursing note reviewed.  Constitutional:      General: She is not in acute distress.    Appearance: Normal appearance. She is not ill-appearing, toxic-appearing or diaphoretic.  HENT:     Head: Normocephalic and atraumatic.     Nose: Nose normal.     Mouth/Throat:     Mouth: Mucous membranes are moist.  Eyes:     Conjunctiva/sclera: Conjunctivae normal.     Pupils: Pupils are equal, round, and reactive to light.  Cardiovascular:     Rate and Rhythm: Normal rate and regular rhythm.     Heart sounds: Murmur heard.     Systolic murmur is present with a grade of 2/6.     Comments: Weeping edema to LLE with wounds to shin and posterior ankle, slight surrounding erythema. Images below.  Pulmonary:     Effort: Pulmonary effort is normal.     Breath sounds: Normal breath sounds.  Musculoskeletal:     Cervical back: Neck supple.     Right lower leg: 2+ Edema present.     Left lower leg: 2+ Edema present.  Skin:    General: Skin is warm and dry.     Capillary Refill: Capillary refill takes less  than 2 seconds.  Neurological:     Mental Status: She is alert. Mental status is at baseline.            Results for orders placed or performed in visit on 08/05/23  COVID-19, Flu A+B and RSV   Collection Time: 08/05/23 11:27 AM   Specimen: Nasopharyngeal(NP) swabs in vial transport medium  Result Value Ref Range   SARS-CoV-2, NAA Not Detected Not Detected   Influenza A, NAA Not Detected Not Detected   Influenza B, NAA Not Detected Not Detected   RSV, NAA Detected (A) Not Detected   Test Information: Comment        Pertinent labs & imaging results that were available during my care of the patient were reviewed by me and considered in my medical decision making.  Assessment & Plan:  Female was seen today for edema.  Diagnoses and all orders for this visit:  Bilateral leg edema -     Anemia Profile B -     CMP14+EGFR -     Brain natriuretic peptide  Cellulitis of left lower extremity -     Anemia Profile B -     CMP14+EGFR -     Brain natriuretic peptide     Assessment and Plan    Lower extremity edema with weeping sores Chronic lower extremity edema with weeping sores, particularly on the left side, and concerns of potential cellulitis due to surrounding erythema. Pitting edema is present. Differential diagnosis includes renal dysfunction, heart failure, or anemia as underlying causes. - Order lab work to assess renal function, BNP levels, and anemia - Apply Una boots to manage edema and protect sores - Schedule follow-up on Monday to remove Una boots and reassess condition  Ingrown toenails Recent ingrown toenails with improved condition following trimming and reduced soreness. - Monitor for recurrence of symptoms or complications  Continue all other maintenance medications.  Follow up plan: Return if symptoms worsen or fail to improve.   Continue healthy lifestyle choices, including diet (rich in fruits, vegetables, and lean proteins, and low  in salt and simple carbohydrates) and exercise (at least 30 minutes of moderate physical activity daily).  Educational handout given for unna boots  The above assessment and management plan was discussed with the patient. The patient verbalized understanding of and has agreed to the management plan. Patient is aware to call the clinic if they develop any new symptoms or if symptoms persist or worsen. Patient is aware when to return to the clinic for a follow-up visit. Patient educated on when it is appropriate to go to the emergency department.   Kari Baars, FNP-C [facility] [phone]

## 2023-09-27 LAB — CMP14+EGFR
ALT: 13 IU/L (ref 0–32)
AST: 19 IU/L (ref 0–40)
Albumin: 3.8 g/dL (ref 3.7–4.7)
Alkaline Phosphatase: 62 IU/L (ref 44–121)
BUN/Creatinine Ratio: 27 (ref 12–28)
BUN: 26 mg/dL (ref 8–27)
Bilirubin Total: 1.3 mg/dL — ABNORMAL HIGH (ref 0.0–1.2)
CO2: 23 mmol/L (ref 20–29)
Calcium: 8.6 mg/dL — ABNORMAL LOW (ref 8.7–10.3)
Chloride: 99 mmol/L (ref 96–106)
Creatinine, Ser: 0.95 mg/dL (ref 0.57–1.00)
Globulin, Total: 1.8 g/dL (ref 1.5–4.5)
Glucose: 94 mg/dL (ref 70–99)
Potassium: 4.1 mmol/L (ref 3.5–5.2)
Sodium: 137 mmol/L (ref 134–144)
Total Protein: 5.6 g/dL — ABNORMAL LOW (ref 6.0–8.5)
eGFR: 58 mL/min/{1.73_m2} — ABNORMAL LOW (ref >59)

## 2023-09-27 LAB — ANEMIA PROFILE B
Basophils Absolute: 0 10*3/uL (ref 0.0–0.2)
Basos: 0 %
EOS (ABSOLUTE): 0.1 10*3/uL (ref 0.0–0.4)
Eos: 2 %
Ferritin: 341 ng/mL — ABNORMAL HIGH (ref 15–150)
Folate: 17.6 ng/mL (ref >3.0)
Hematocrit: 21.6 % — ABNORMAL LOW (ref 34.0–46.6)
Hemoglobin: 7.3 g/dL — CL (ref 11.1–15.9)
Immature Grans (Abs): 0 10*3/uL (ref 0.0–0.1)
Immature Granulocytes: 1 %
Iron Saturation: 43 % (ref 15–55)
Iron: 92 ug/dL (ref 27–139)
Lymphocytes Absolute: 0.5 10*3/uL — ABNORMAL LOW (ref 0.7–3.1)
Lymphs: 18 %
MCH: 38.8 pg — ABNORMAL HIGH (ref 26.6–33.0)
MCHC: 33.8 g/dL (ref 31.5–35.7)
MCV: 115 fL — ABNORMAL HIGH (ref 79–97)
Monocytes Absolute: 0.1 10*3/uL (ref 0.1–0.9)
Monocytes: 4 %
Neutrophils Absolute: 2.1 10*3/uL (ref 1.4–7.0)
Neutrophils: 75 %
Platelets: 126 10*3/uL — ABNORMAL LOW (ref 150–450)
RBC: 1.88 x10E6/uL — CL (ref 3.77–5.28)
RDW: 21.8 % — ABNORMAL HIGH (ref 11.7–15.4)
Retic Ct Pct: 1.3 % (ref 0.6–2.6)
Total Iron Binding Capacity: 212 ug/dL — ABNORMAL LOW (ref 250–450)
UIBC: 120 ug/dL (ref 118–369)
Vitamin B-12: <50 pg/mL — ABNORMAL LOW (ref 232–1245)
WBC: 2.8 10*3/uL — ABNORMAL LOW (ref 3.4–10.8)

## 2023-09-27 LAB — BRAIN NATRIURETIC PEPTIDE: BNP: 212.4 pg/mL — ABNORMAL HIGH (ref 0.0–100.0)

## 2023-09-28 ENCOUNTER — Other Ambulatory Visit: Payer: Self-pay

## 2023-09-28 ENCOUNTER — Observation Stay (HOSPITAL_COMMUNITY)
Admission: EM | Admit: 2023-09-28 | Discharge: 2023-09-29 | Disposition: A | Discharge status: Home / Self Care | Attending: Family Medicine | Admitting: Family Medicine

## 2023-09-28 ENCOUNTER — Encounter (HOSPITAL_COMMUNITY): Payer: Self-pay | Admitting: Pharmacy Technician

## 2023-09-28 DIAGNOSIS — Z23 Encounter for immunization: Secondary | ICD-10-CM | POA: Insufficient documentation

## 2023-09-28 DIAGNOSIS — Z79899 Other long term (current) drug therapy: Secondary | ICD-10-CM | POA: Insufficient documentation

## 2023-09-28 DIAGNOSIS — K2970 Gastritis, unspecified, without bleeding: Secondary | ICD-10-CM | POA: Insufficient documentation

## 2023-09-28 DIAGNOSIS — E559 Vitamin D deficiency, unspecified: Secondary | ICD-10-CM | POA: Diagnosis not present

## 2023-09-28 DIAGNOSIS — M8588 Other specified disorders of bone density and structure, other site: Secondary | ICD-10-CM | POA: Diagnosis not present

## 2023-09-28 DIAGNOSIS — I10 Essential (primary) hypertension: Secondary | ICD-10-CM | POA: Diagnosis present

## 2023-09-28 DIAGNOSIS — D649 Anemia, unspecified: Secondary | ICD-10-CM | POA: Diagnosis not present

## 2023-09-28 DIAGNOSIS — D519 Vitamin B12 deficiency anemia, unspecified: Principal | ICD-10-CM | POA: Insufficient documentation

## 2023-09-28 DIAGNOSIS — R600 Localized edema: Secondary | ICD-10-CM | POA: Diagnosis present

## 2023-09-28 DIAGNOSIS — Z8542 Personal history of malignant neoplasm of other parts of uterus: Secondary | ICD-10-CM | POA: Insufficient documentation

## 2023-09-28 DIAGNOSIS — I350 Nonrheumatic aortic (valve) stenosis: Secondary | ICD-10-CM

## 2023-09-28 DIAGNOSIS — I6529 Occlusion and stenosis of unspecified carotid artery: Secondary | ICD-10-CM | POA: Diagnosis present

## 2023-09-28 DIAGNOSIS — R7989 Other specified abnormal findings of blood chemistry: Secondary | ICD-10-CM | POA: Diagnosis present

## 2023-09-28 DIAGNOSIS — M8580 Other specified disorders of bone density and structure, unspecified site: Secondary | ICD-10-CM | POA: Diagnosis present

## 2023-09-28 DIAGNOSIS — I959 Hypotension, unspecified: Secondary | ICD-10-CM | POA: Diagnosis not present

## 2023-09-28 DIAGNOSIS — E538 Deficiency of other specified B group vitamins: Secondary | ICD-10-CM | POA: Diagnosis present

## 2023-09-28 HISTORY — DX: Unspecified dementia, unspecified severity, without behavioral disturbance, psychotic disturbance, mood disturbance, and anxiety: F03.90

## 2023-09-28 LAB — CBC WITH DIFFERENTIAL/PLATELET
Abs Immature Granulocytes: 0 10*3/uL (ref 0.00–0.07)
Basophils Absolute: 0 10*3/uL (ref 0.0–0.1)
Basophils Relative: 0 %
Eosinophils Absolute: 0 10*3/uL (ref 0.0–0.5)
Eosinophils Relative: 0 %
HCT: 20.6 % — ABNORMAL LOW (ref 36.0–46.0)
Hemoglobin: 7.1 g/dL — ABNORMAL LOW (ref 12.0–15.0)
Lymphocytes Relative: 25 %
Lymphs Abs: 0.6 10*3/uL — ABNORMAL LOW (ref 0.7–4.0)
MCH: 39.4 pg — ABNORMAL HIGH (ref 26.0–34.0)
MCHC: 34.5 g/dL (ref 30.0–36.0)
MCV: 114.4 fL — ABNORMAL HIGH (ref 80.0–100.0)
Monocytes Absolute: 0 10*3/uL — ABNORMAL LOW (ref 0.1–1.0)
Monocytes Relative: 2 %
Neutro Abs: 1.7 10*3/uL (ref 1.7–7.7)
Neutrophils Relative %: 73 %
Platelets: 150 10*3/uL (ref 150–400)
RBC: 1.8 MIL/uL — ABNORMAL LOW (ref 3.87–5.11)
RDW: 21.9 % — ABNORMAL HIGH (ref 11.5–15.5)
WBC: 2.3 10*3/uL — ABNORMAL LOW (ref 4.0–10.5)
nRBC: 0 % (ref 0.0–0.2)

## 2023-09-28 LAB — URINALYSIS, ROUTINE W REFLEX MICROSCOPIC
Bilirubin Urine: NEGATIVE
Glucose, UA: NEGATIVE mg/dL
Hgb urine dipstick: NEGATIVE
Ketones, ur: NEGATIVE mg/dL
Leukocytes,Ua: NEGATIVE
Nitrite: NEGATIVE
Protein, ur: NEGATIVE mg/dL
Specific Gravity, Urine: 1.01 (ref 1.005–1.030)
pH: 7 (ref 5.0–8.0)

## 2023-09-28 LAB — COMPREHENSIVE METABOLIC PANEL
ALT: 14 U/L (ref 0–44)
AST: 21 U/L (ref 15–41)
Albumin: 3.4 g/dL — ABNORMAL LOW (ref 3.5–5.0)
Alkaline Phosphatase: 44 U/L (ref 38–126)
Anion gap: 9 (ref 5–15)
BUN: 26 mg/dL — ABNORMAL HIGH (ref 8–23)
CO2: 25 mmol/L (ref 22–32)
Calcium: 8.7 mg/dL — ABNORMAL LOW (ref 8.9–10.3)
Chloride: 99 mmol/L (ref 98–111)
Creatinine, Ser: 0.88 mg/dL (ref 0.44–1.00)
GFR, Estimated: >60 mL/min (ref >60)
Glucose, Bld: 103 mg/dL — ABNORMAL HIGH (ref 70–99)
Potassium: 3.5 mmol/L (ref 3.5–5.1)
Sodium: 133 mmol/L — ABNORMAL LOW (ref 135–145)
Total Bilirubin: 1.5 mg/dL — ABNORMAL HIGH (ref 0.0–1.2)
Total Protein: 5.6 g/dL — ABNORMAL LOW (ref 6.5–8.1)

## 2023-09-28 LAB — FERRITIN: Ferritin: 169 ng/mL (ref 11–307)

## 2023-09-28 LAB — PROTIME-INR
INR: 1.2 (ref 0.8–1.2)
Prothrombin Time: 15.1 s (ref 11.4–15.2)

## 2023-09-28 LAB — APTT: aPTT: 31 s (ref 24–36)

## 2023-09-28 LAB — RETICULOCYTES
Immature Retic Fract: 15.4 % (ref 2.3–15.9)
RBC.: 1.59 MIL/uL — ABNORMAL LOW (ref 3.87–5.11)
Retic Count, Absolute: 14.2 10*3/uL — ABNORMAL LOW (ref 19.0–186.0)
Retic Ct Pct: 0.9 % (ref 0.4–3.1)

## 2023-09-28 LAB — FOLATE: Folate: 19.8 ng/mL (ref >5.9)

## 2023-09-28 LAB — IRON AND TIBC
Iron: 92 ug/dL (ref 28–170)
Saturation Ratios: 39 % — ABNORMAL HIGH (ref 10.4–31.8)
TIBC: 237 ug/dL — ABNORMAL LOW (ref 250–450)
UIBC: 145 ug/dL

## 2023-09-28 LAB — VITAMIN B12: Vitamin B-12: <50 pg/mL — ABNORMAL LOW (ref 180–914)

## 2023-09-28 LAB — PREPARE RBC (CROSSMATCH)

## 2023-09-28 LAB — TSH: TSH: 2.555 u[IU]/mL (ref 0.350–4.500)

## 2023-09-28 LAB — MAGNESIUM: Magnesium: 2.1 mg/dL (ref 1.7–2.4)

## 2023-09-28 LAB — BRAIN NATRIURETIC PEPTIDE: B Natriuretic Peptide: 450 pg/mL — ABNORMAL HIGH (ref 0.0–100.0)

## 2023-09-28 LAB — ABO/RH: ABO/RH(D): A POS

## 2023-09-28 LAB — PHOSPHORUS: Phosphorus: 3.8 mg/dL (ref 2.5–4.6)

## 2023-09-28 MED ORDER — LEVALBUTEROL HCL 0.63 MG/3ML IN NEBU: 0.6300 mg | INHALATION_SOLUTION | Freq: Four times a day (QID) | RESPIRATORY_TRACT | Status: DC | PRN

## 2023-09-28 MED ORDER — HALOPERIDOL LACTATE 5 MG/ML IJ SOLN
1.0000 mg | Freq: Four times a day (QID) | INTRAMUSCULAR | Status: DC | PRN
  Administered 2023-09-28: 1 mg via INTRAMUSCULAR
  Filled 2023-09-28: qty 1

## 2023-09-28 MED ORDER — FLEET ENEMA RE ENEM: 1.0000 | ENEMA | Freq: Once | RECTAL | Status: DC | PRN

## 2023-09-28 MED ORDER — HYDROXYZINE PAMOATE 25 MG PO CAPS: 25.0000 mg | ORAL_CAPSULE | Freq: Three times a day (TID) | ORAL | Status: DC | PRN

## 2023-09-28 MED ORDER — ONDANSETRON HCL 4 MG PO TABS: 4.0000 mg | ORAL_TABLET | Freq: Four times a day (QID) | ORAL | Status: DC | PRN

## 2023-09-28 MED ORDER — SODIUM CHLORIDE 0.9% FLUSH
3.0000 mL | Freq: Two times a day (BID) | INTRAVENOUS | Status: DC
  Administered 2023-09-28 – 2023-09-29 (×2): 3 mL via INTRAVENOUS

## 2023-09-28 MED ORDER — HYDROXYZINE HCL 25 MG PO TABS
25.0000 mg | ORAL_TABLET | Freq: Three times a day (TID) | ORAL | Status: DC | PRN
  Administered 2023-09-28 (×2): 25 mg via ORAL
  Filled 2023-09-28 (×2): qty 1

## 2023-09-28 MED ORDER — ONDANSETRON HCL 4 MG/2ML IJ SOLN: 4.0000 mg | Freq: Four times a day (QID) | INTRAMUSCULAR | Status: DC | PRN

## 2023-09-28 MED ORDER — CYANOCOBALAMIN 1000 MCG/ML IJ SOLN
1000.0000 ug | Freq: Once | INTRAMUSCULAR | Status: AC
  Administered 2023-09-28: 1000 ug via INTRAMUSCULAR
  Filled 2023-09-28: qty 1

## 2023-09-28 MED ORDER — ZIPRASIDONE MESYLATE 20 MG IM SOLR
10.0000 mg | Freq: Four times a day (QID) | INTRAMUSCULAR | Status: DC | PRN
  Administered 2023-09-29: 10 mg via INTRAMUSCULAR
  Filled 2023-09-28 (×2): qty 20

## 2023-09-28 MED ORDER — TRAZODONE HCL 50 MG PO TABS: 25.0000 mg | ORAL_TABLET | Freq: Every evening | ORAL | Status: DC | PRN

## 2023-09-28 MED ORDER — AMLODIPINE BESYLATE 5 MG PO TABS
5.0000 mg | ORAL_TABLET | Freq: Every day | ORAL | Status: DC
  Administered 2023-09-28 – 2023-09-29 (×2): 5 mg via ORAL
  Filled 2023-09-28 (×2): qty 1

## 2023-09-28 MED ORDER — ACETAMINOPHEN 325 MG PO TABS: 650.0000 mg | ORAL_TABLET | Freq: Four times a day (QID) | ORAL | Status: DC | PRN

## 2023-09-28 MED ORDER — SODIUM CHLORIDE 0.9% IV SOLUTION: Freq: Once | INTRAVENOUS | Status: AC

## 2023-09-28 MED ORDER — VITAMIN B-12 1000 MCG PO TABS: 1000.0000 ug | ORAL_TABLET | Freq: Every day | ORAL | Status: DC

## 2023-09-28 MED ORDER — SODIUM CHLORIDE 0.9% FLUSH: 3.0000 mL | INTRAVENOUS | Status: DC | PRN

## 2023-09-28 MED ORDER — BISACODYL 5 MG PO TBEC: 5.0000 mg | DELAYED_RELEASE_TABLET | Freq: Every day | ORAL | Status: DC | PRN

## 2023-09-28 MED ORDER — FUROSEMIDE 10 MG/ML IJ SOLN
40.0000 mg | Freq: Two times a day (BID) | INTRAMUSCULAR | Status: DC
  Administered 2023-09-28: 40 mg via INTRAVENOUS
  Filled 2023-09-28 (×2): qty 4

## 2023-09-28 MED ORDER — ACETAMINOPHEN 650 MG RE SUPP: 650.0000 mg | Freq: Four times a day (QID) | RECTAL | Status: DC | PRN

## 2023-09-28 MED ORDER — SODIUM CHLORIDE 0.9% FLUSH
3.0000 mL | Freq: Two times a day (BID) | INTRAVENOUS | Status: DC
  Administered 2023-09-28: 3 mL via INTRAVENOUS

## 2023-09-28 MED ORDER — TRAZODONE HCL 50 MG PO TABS
50.0000 mg | ORAL_TABLET | Freq: Every day | ORAL | Status: DC
Start: 2023-09-28 — End: 2023-09-29
  Administered 2023-09-28: 50 mg via ORAL
  Filled 2023-09-28: qty 1

## 2023-09-28 MED ORDER — LORAZEPAM 2 MG/ML IJ SOLN: 0.5000 mg | INTRAMUSCULAR | Status: DC | PRN

## 2023-09-28 MED ORDER — IPRATROPIUM BROMIDE 0.02 % IN SOLN: 0.5000 mg | Freq: Four times a day (QID) | RESPIRATORY_TRACT | Status: DC | PRN

## 2023-09-28 MED ORDER — HYDRALAZINE HCL 20 MG/ML IJ SOLN: 10.0000 mg | INTRAMUSCULAR | Status: DC | PRN

## 2023-09-28 MED ORDER — SODIUM CHLORIDE 0.9% FLUSH: 3.0000 mL | Freq: Two times a day (BID) | INTRAVENOUS | Status: DC

## 2023-09-28 MED ORDER — MECLIZINE HCL 12.5 MG PO TABS: 25.0000 mg | ORAL_TABLET | Freq: Three times a day (TID) | ORAL | Status: DC | PRN

## 2023-09-28 MED ORDER — HEPARIN SODIUM (PORCINE) 5000 UNIT/ML IJ SOLN
5000.0000 [IU] | Freq: Three times a day (TID) | INTRAMUSCULAR | Status: DC
  Administered 2023-09-28 – 2023-09-29 (×3): 5000 [IU] via SUBCUTANEOUS
  Filled 2023-09-28 (×4): qty 1

## 2023-09-28 MED ORDER — HYDROMORPHONE HCL 1 MG/ML IJ SOLN
0.5000 mg | INTRAMUSCULAR | Status: DC | PRN
  Administered 2023-09-28: 0.5 mg via INTRAVENOUS
  Filled 2023-09-28 (×2): qty 1

## 2023-09-28 MED ORDER — FOLIC ACID 1 MG PO TABS
1.0000 mg | ORAL_TABLET | Freq: Every day | ORAL | Status: DC
  Administered 2023-09-28 – 2023-09-29 (×2): 1 mg via ORAL
  Filled 2023-09-28 (×2): qty 1

## 2023-09-28 MED ORDER — SENNOSIDES-DOCUSATE SODIUM 8.6-50 MG PO TABS: 1.0000 | ORAL_TABLET | Freq: Every evening | ORAL | Status: DC | PRN

## 2023-09-28 MED ORDER — POLYETHYLENE GLYCOL 3350 17 G PO PACK
17.0000 g | PACK | Freq: Every day | ORAL | Status: DC
  Administered 2023-09-28 – 2023-09-29 (×2): 17 g via ORAL
  Filled 2023-09-28 (×2): qty 1

## 2023-09-28 MED ORDER — OXYCODONE HCL 5 MG PO TABS: 5.0000 mg | ORAL_TABLET | ORAL | Status: DC | PRN

## 2023-09-28 MED ORDER — VITAMIN D 25 MCG (1000 UNIT) PO TABS
2000.0000 [IU] | ORAL_TABLET | Freq: Every day | ORAL | Status: DC
Start: 2023-09-29 — End: 2023-09-29
  Administered 2023-09-29: 2000 [IU] via ORAL
  Filled 2023-09-28: qty 2

## 2023-09-28 NOTE — Assessment & Plan Note (Signed)
-   Last echo 12/20/2022 :EJF; 65 to 70%. There is mild concentric left ventricular hypertrophy. Left ventricular diastolic function could not be evaluated due to mitral stenosis.   Mitral Valve: The mitral valve is degenerative,  moderate calcification of the mitral valve leaflet(s). Mild mitral valve regurgitation. Moderate mitral valve stenosis.   Tricuspid Valve: The tricuspid valve is grossly normal.Mild to moderate aortic  stenosis is present.   -Will continue to monitor -Patient is to follow-up as an outpatient with cardiology for valvular stenosis

## 2023-09-28 NOTE — Assessment & Plan Note (Signed)
-   Continue vitamin D supplement -Follow-up with PCP as an outpatient

## 2023-09-28 NOTE — Assessment & Plan Note (Signed)
-   Patient is not on any antiplatelet therapy -No history of stroke

## 2023-09-28 NOTE — Hospital Course (Addendum)
 Michelle [name] is a 89 year old female with extensive history of osteopenia, vitamin D deficiency, hypertension, seasonal allergies, aortic stenosis, GERD, IBS, uterine cancer.... Presented today from assisted living from PCP office for severe generalized weaknesses especially in lower extremities which was progressing past month.  Patient arrived from [facility] assisted living as the lab revealed drop in her hemoglobin and progressive weakness.Marland KitchenMarland KitchenShe denies any melena or hematochezia.   ED evaluation/course: Blood pressure (!) 150/64, pulse 93, temperature 98.1 F (36.7 C), resp. rate 16, SpO2 98%. On RA   Labs:  CBC; WBC of 2.3, RBC 1.8, hemoglobin 7.1, MCV 114.4, RDW 21.9, INR 1.2, Hemoccult negative per EDP B12 <50 Folic acid 17.6 Iron 92, TIBC 212, ferritin 341, iron sat 43, CMP; sodium 133, potassium 3.5, glucose 103, BUN 26, creatinine 0.87, calcium 8.7, albumin 2.4, GFR > 60   Requested for patient to be admitted: As her hemoglobin dropped from 12.9 last year to 7.1 now with severe B12 deficiency Further anemia workup and blood transfusion, as patient is symptomatic with severe generalized weaknesses

## 2023-09-28 NOTE — Assessment & Plan Note (Addendum)
-   Symptomatic anemia -with severe generalized weaknesses, mild tachycardia -Pancytopenia: WBC of 12.3, RBC 1.80, hemoglobin 7.1, hematocrit 20.6, MCV 14.4, MCH 39.4, RDW 21.9 -consistent with macrocytic anemia Due to B12 deficiency serum B12 less than 50  -Baseline hemoglobin 12.9 same time last year>>> now 7.1  -Per EDP: Hemoccult negative  -Replating B12 1000 mcg IM daily x 3  -Transfusing 2U PRBC (Pros and cons of blood transfusion discussed with the patient-expressed understanding and agreement)     Latest Ref Rng & Units 09/28/2023   11:15 AM 09/26/2023   12:16 PM 09/25/2022   12:26 PM  CBC  WBC 4.0 - 10.5 K/uL 2.3  2.8  4.0   Hemoglobin 12.0 - 15.0 g/dL 7.1  7.3  08.6   Hematocrit 36.0 - 46.0 % 20.6  21.6  39.0   Platelets 150 - 400 K/uL 150  126  216

## 2023-09-28 NOTE — Assessment & Plan Note (Signed)
-   Stable, -Monitoring BP closely due to progressive anemia -Home medication of HCTZ and Norvasc reviewed will resume accordingly

## 2023-09-28 NOTE — Assessment & Plan Note (Signed)
 Continue vitamin D supplements.

## 2023-09-28 NOTE — Assessment & Plan Note (Signed)
 hemoglobin 7.1, hematocrit 20.6, MCV 14.4, MCH 39.4, RDW 21.9 -consistent with macrocytic anemia Due to B12 deficiency serum B12 less than 50  -Initiating 1000 mcg of B12 IM daily x  3 days -Initiating p.o. B12 supplement

## 2023-09-28 NOTE — Plan of Care (Signed)

## 2023-09-28 NOTE — ED Triage Notes (Signed)
 Pt here via ems from [facility] with reports of abnormal hemoglobin from blood work obtained on Friday. Pt with no complaints. VSS.

## 2023-09-28 NOTE — ED Provider Notes (Signed)
 [facility] Provider Note   CSN: [id number] Arrival date & time: 09/28/23  1052     History  Chief Complaint  Patient presents with   Abnormal Lab    Michelle [name] is a 89 y.o. female.  Patient is an 89 year old female who presents to the Emergency Department with a chief complaint of increased weakness and abnormal labs.  Patient notes that she was evaluated by her primary care doctor 2 days ago and was contacted noting that her hemoglobin and hematocrit were low.  Patient notes that she has had no associated syncopal events and denies any active dizziness.  She denies any chest pain or shortness of breath.  Patient has had no associate abdominal pain, nausea, vomiting, diarrhea.  She denies any melena or hematochezia.  Daughter does state that the patient has had a history of B12 deficiency in the past but is not currently on any supplementation at this point.     Home Medications Prior to Admission medications   Medication Sig Start Date End Date Taking? Authorizing Provider  acetaminophen (TYLENOL) 500 MG tablet Take 500 mg by mouth every 6 (six) hours as needed for headache.    [provider]  amLODipine (NORVASC) 2.5 MG tablet Take 3 tablets by mouth daily 02/27/23 02/27/24  Rollene Rotunda, MD  cetirizine (ZYRTEC ALLERGY) 10 MG tablet Take 1 tablet (10 mg total) by mouth daily. As needed 11/27/16   Bennie Pierini, FNP  Cholecalciferol (VITAMIN D3) 2000 UNITS TABS Take by mouth daily.    [provider]  fluticasone (FLONASE) 50 MCG/ACT nasal spray 1 spray in both nostrils 2 times daily as needed for allergies or rhinitis. 10/23/20   Dettinger, Elige Radon, MD  hydrochlorothiazide (HYDRODIURIL) 25 MG tablet Take 1 tablet (25 mg total) by mouth daily. **NEEDS TO BE SEEN BEFORE NEXT REFILL** 04/15/23   Dettinger, Elige Radon, MD  hydrOXYzine (VISTARIL) 25 MG capsule Take 1 capsule (25 mg total) by mouth every 8 (eight)  hours as needed for anxiety. 06/20/23   Dettinger, Elige Radon, MD  meclizine (ANTIVERT) 25 MG tablet Take 1 tablet (25 mg total) by mouth 3 (three) times daily as needed for dizziness. 09/25/22   Dettinger, Elige Radon, MD  polyethylene glycol (MIRALAX / GLYCOLAX) packet Take 17 g by mouth daily. 12/27/17   Loren Racer, MD  sodium chloride (MURO 128) 5 % ophthalmic solution 1 drop 3 (three) times daily. 09/12/21   [provider]  traZODone (DESYREL) 50 MG tablet Take 0.5 tablets (25 mg total) by mouth at bedtime as needed for sleep. 06/20/23   Dettinger, Elige Radon, MD  Wheat Dextrin (BENEFIBER) POWD Take by mouth. Takes in the morning    [provider]      Allergies    Nitrofurantoin, Lisinopril, and Sulfonamide derivatives    Review of Systems   Review of Systems  Constitutional:  Positive for fatigue.  All other systems reviewed and are negative.   Physical Exam Updated Vital Signs BP (!) 150/64   Pulse 93   Temp 98.1 F (36.7 C)   Resp 16   SpO2 98%  Physical Exam Vitals and nursing note reviewed. Exam conducted with a chaperone present.  Constitutional:      Appearance: Normal appearance.  HENT:     Head: Normocephalic and atraumatic.     Mouth/Throat:     Mouth: Mucous membranes are moist.  Eyes:     Extraocular Movements: Extraocular movements intact.  Conjunctiva/sclera: Conjunctivae normal.     Pupils: Pupils are equal, round, and reactive to light.  Cardiovascular:     Rate and Rhythm: Normal rate and regular rhythm.     Pulses: Normal pulses.     Heart sounds: Normal heart sounds.     Comments: Holosystolic murmur Pulmonary:     Effort: Pulmonary effort is normal.     Breath sounds: Normal breath sounds.  Abdominal:     General: Abdomen is flat. Bowel sounds are normal. There is no distension.     Palpations: Abdomen is soft. There is no mass.     Tenderness: There is no abdominal tenderness. There is no guarding.  Genitourinary:    Rectum:  Guaiac result negative.     Comments: Brown stool in the rectal vault Musculoskeletal:        General: Normal range of motion.     Cervical back: Normal range of motion and neck supple.  Skin:    Findings: No bruising or rash.  Neurological:     General: No focal deficit present.     Mental Status: She is alert and oriented to person, place, and time. Mental status is at baseline.     Cranial Nerves: No cranial nerve deficit.     Sensory: No sensory deficit.     Motor: No weakness.     Coordination: Coordination normal.     Gait: Gait normal.  Psychiatric:        Mood and Affect: Mood normal.        Behavior: Behavior normal.        Thought Content: Thought content normal.        Judgment: Judgment normal.     ED Results / Procedures / Treatments   Labs (all labs ordered are listed, but only abnormal results are displayed) Labs Reviewed  COMPREHENSIVE METABOLIC PANEL - Abnormal; Notable for the following components:      Result Value   Sodium 133 (*)    Glucose, Bld 103 (*)    BUN 26 (*)    Calcium 8.7 (*)    Total Protein 5.6 (*)    Albumin 3.4 (*)    Total Bilirubin 1.5 (*)    All other components within normal limits  CBC WITH DIFFERENTIAL/PLATELET - Abnormal; Notable for the following components:   WBC 2.3 (*)    RBC 1.80 (*)    Hemoglobin 7.1 (*)    HCT 20.6 (*)    MCV 114.4 (*)    MCH 39.4 (*)    RDW 21.9 (*)    Lymphs Abs 0.6 (*)    Monocytes Absolute 0.0 (*)    All other components within normal limits  RETICULOCYTES - Abnormal; Notable for the following components:   RBC. 1.59 (*)    Retic Count, Absolute 14.2 (*)    All other components within normal limits  EXPECTORATED SPUTUM ASSESSMENT W GRAM STAIN, RFLX TO RESP C  PROTIME-INR  APTT  VITAMIN B12  FOLATE  IRON AND TIBC  FERRITIN  MAGNESIUM  PHOSPHORUS  PROTIME-INR  TSH  TYPE AND SCREEN  ABO/RH  TYPE AND SCREEN  PREPARE RBC (CROSSMATCH)    EKG None  Radiology No results  found.  Procedures Procedures    Medications Ordered in ED Medications  heparin injection 5,000 Units (has no administration in time range)  sodium chloride flush (NS) 0.9 % injection 3 mL (has no administration in time range)  sodium chloride flush (NS) 0.9 % injection 3 mL (  has no administration in time range)  acetaminophen (TYLENOL) tablet 650 mg (has no administration in time range)    Or  acetaminophen (TYLENOL) suppository 650 mg (has no administration in time range)  oxyCODONE (Oxy IR/ROXICODONE) immediate release tablet 5 mg (has no administration in time range)  HYDROmorphone (DILAUDID) injection 0.5-1 mg (has no administration in time range)  traZODone (DESYREL) tablet 25 mg (has no administration in time range)  senna-docusate (Senokot-S) tablet 1 tablet (has no administration in time range)  bisacodyl (DULCOLAX) EC tablet 5 mg (has no administration in time range)  sodium phosphate (FLEET) enema 1 enema (has no administration in time range)  ondansetron (ZOFRAN) tablet 4 mg (has no administration in time range)    Or  ondansetron (ZOFRAN) injection 4 mg (has no administration in time range)  ipratropium (ATROVENT) nebulizer solution 0.5 mg (has no administration in time range)  levalbuterol (XOPENEX) nebulizer solution 0.63 mg (has no administration in time range)  hydrALAZINE (APRESOLINE) injection 10 mg (has no administration in time range)  sodium chloride flush (NS) 0.9 % injection 3-10 mL (has no administration in time range)  sodium chloride flush (NS) 0.9 % injection 3-10 mL (has no administration in time range)  cyanocobalamin (VITAMIN B12) injection 1,000 mcg (has no administration in time range)  folic acid (FOLVITE) tablet 1 mg (has no administration in time range)  0.9 %  sodium chloride infusion (Manually program via Guardrails IV Fluids) (has no administration in time range)    ED Course/ Medical Decision Making/ A&P                                  Medical Decision Making Amount and/or Complexity of Data Reviewed Labs: ordered.  Risk Decision regarding hospitalization.   This patient presents to the ED for concern of weakness, abnormal labs, this involves an extensive number of treatment options, and is a complaint that carries with it a high risk of complications and morbidity.  The differential diagnosis includes iron deficiency anemia, folate deficiency, GI bleed, anemia of chronic disease  Co morbidities that complicate the patient evaluation  Dementia   Additional history obtained:  Additional history obtained from daughter External records from outside source obtained and reviewed including medical records   Lab Tests:  I Ordered, and personally interpreted labs.  The pertinent results include: Anemia, no changes in kidney function or liver function, leukopenia, normal platelets, Hemoccult negative   Cardiac Monitoring: / EKG:  The patient was maintained on a cardiac monitor.  I personally viewed and interpreted the cardiac monitored which showed an underlying rhythm of: Normal sinus rhythm, no ST/T wave changes, no ischemic changes, no STEMI   Consultations Obtained:  I requested consultation with the hospitalist,  and discussed lab and imaging findings as well as pertinent plan - they recommend: Patient   Problem List / ED Course / Critical interventions / Medication management  Patient does remain stable at this time.  Discussed with patient and daughter that we will plan for admission to the hospitalist service.  Patient has had some worsening weakness.  She was Hemoccult negative.  Vital signs are stable at this point with no tachycardia no hypotension.  Patient does have a holosystolic murmur noted on physical exam.  She has no other secondary findings of bruising or indication for traumatic injury.  EKG was within normal limits.  Have discussed patient case with Dr. Flossie Dibble who has accepted  for admission  at this time.  Social Determinants of Health:  Lives at an assisted living facility   Test / Admission - Considered:  Admission        Final Clinical Impression(s) / ED Diagnoses Final diagnoses:  Anemia, unspecified type    Rx / DC Orders ED Discharge Orders     None         Lelon Perla, PA-C 09/28/23 1308    Bethann Berkshire, MD 09/29/23 [phone]

## 2023-09-28 NOTE — H&P (Signed)
 History and Physical   Patient: Michelle [name]                            PCP: Dettinger, Elige Radon, MD                    DOB: Sep 26, 1933            DOA: 09/28/2023 [id number]             DOS: 09/28/2023, 2:49 PM  Dettinger, Elige Radon, MD  Patient coming from:   HOME  I have personally reviewed patient's medical records, in electronic medical records, including:  [facility], and care everywhere.    Chief Complaint:   Chief Complaint  Patient presents with   Abnormal Lab    History of present illness:    Michelle [name] is a 89 year old female with extensive history of osteopenia, vitamin D deficiency, hypertension, seasonal allergies, aortic stenosis, GERD, IBS, uterine cancer.... Presented today from assisted living from PCP office for severe generalized weaknesses especially in lower extremities which was progressing past month.  Patient arrived from [facility] assisted living as the lab revealed drop in her hemoglobin and progressive weakness.Marland KitchenMarland KitchenShe denies any melena or hematochezia.   ED evaluation/course: Blood pressure (!) 150/64, pulse 93, temperature 98.1 F (36.7 C), resp. rate 16, SpO2 98%. On RA   Labs:  CBC; WBC of 2.3, RBC 1.8, hemoglobin 7.1, MCV 114.4, RDW 21.9, INR 1.2, Hemoccult negative per EDP B12 <50 Folic acid 17.6 Iron 92, TIBC 212, ferritin 341, iron sat 43, CMP; sodium 133, potassium 3.5, glucose 103, BUN 26, creatinine 0.87, calcium 8.7, albumin 2.4, GFR > 60   Requested for patient to be admitted: As her hemoglobin dropped from 12.9 last year to 7.1 now with severe B12 deficiency Further anemia workup and blood transfusion, as patient is symptomatic with severe generalized weaknesses    Patient Denies having: Fever, Chills, Cough, SOB, Chest Pain, Abd pain, N/V/D, headache, dizziness, lightheadedness,  Dysuria, Joint pain, rash, open wounds    Review of Systems: As per HPI, otherwise 10 point review of systems were negative.    ----------------------------------------------------------------------------------------------------------------------  Allergies  Allergen Reactions   Nitrofurantoin     Pt does not remember this medicine   Lisinopril Cough   Sulfonamide Derivatives Rash    Home MEDs:  Prior to Admission medications   Medication Sig Start Date End Date Taking? Authorizing Provider  acetaminophen (TYLENOL) 500 MG tablet Take 500 mg by mouth every 6 (six) hours as needed for headache.   Yes [provider]  amLODipine (NORVASC) 2.5 MG tablet Take 3 tablets by mouth daily 02/27/23 02/27/24 Yes Hochrein, Fayrene Fearing, MD  anti-nausea (EMETROL) solution Take 10 mLs by mouth every 15 (fifteen) minutes as needed for nausea or vomiting.   Yes [provider]  cetirizine (ZYRTEC ALLERGY) 10 MG tablet Take 1 tablet (10 mg total) by mouth daily. As needed Patient taking differently: Take 10 mg by mouth daily as needed for allergies or rhinitis. 11/27/16  Yes Martin, Mary-Margaret, FNP  Cholecalciferol (VITAMIN D3) 2000 UNITS TABS Take 2,000 Units by mouth daily.   Yes [provider]  hydrochlorothiazide (HYDRODIURIL) 25 MG tablet Take 1 tablet (25 mg total) by mouth daily. **NEEDS TO BE SEEN BEFORE NEXT REFILL** 04/15/23  Yes Dettinger, Elige Radon, MD  hydrOXYzine (VISTARIL) 25 MG capsule Take 1 capsule (25 mg total) by mouth every 8 (eight) hours as  needed for anxiety. 06/20/23  Yes Dettinger, Elige Radon, MD  loperamide (IMODIUM) 2 MG capsule Take 2 mg by mouth as needed for diarrhea or loose stools.   Yes [provider]  meclizine (ANTIVERT) 25 MG tablet Take 1 tablet (25 mg total) by mouth 3 (three) times daily as needed for dizziness. 09/25/22  Yes Dettinger, Elige Radon, MD  polyethylene glycol (MIRALAX / GLYCOLAX) packet Take 17 g by mouth daily. 12/27/17  Yes Loren Racer, MD  sodium chloride (MURO 128) 5 % ophthalmic solution 1 drop 3 (three) times daily. Affected eye(s) 09/12/21  Yes  [provider]  traZODone (DESYREL) 50 MG tablet Take 0.5 tablets (25 mg total) by mouth at bedtime as needed for sleep. 06/20/23  Yes Dettinger, Elige Radon, MD  Wheat Dextrin (BENEFIBER) POWD Take 1 Dose by mouth daily.   Yes [provider]    PRN MEDs: acetaminophen **OR** acetaminophen, bisacodyl, hydrALAZINE, HYDROmorphone (DILAUDID) injection, hydrOXYzine, ipratropium, levalbuterol, ondansetron **OR** ondansetron (ZOFRAN) IV, oxyCODONE, senna-docusate, sodium chloride flush  Past Medical History:  Diagnosis Date   Aortic stenosis    Mild echo 2014   Diverticulosis    Duodenitis without mention of hemorrhage    Esophagitis, unspecified    GERD (gastroesophageal reflux disease)    Heart murmur    Hypertension    IBS (irritable bowel syndrome)    Uterine cancer [facility])     Past Surgical History:  Procedure Laterality Date   CATARACT EXTRACTION W/PHACO Right 06/01/2021   Procedure: CATARACT EXTRACTION PHACO AND INTRAOCULAR LENS PLACEMENT (IOC);  Surgeon: Fabio Pierce, MD;  Location: [facility];  Service: Ophthalmology;  Laterality: Right;  CDE 18.80   CATARACT EXTRACTION W/PHACO Left 07/20/2021   Procedure: CATARACT EXTRACTION PHACO AND INTRAOCULAR LENS PLACEMENT (IOC);  Surgeon: Fabio Pierce, MD;  Location: [facility];  Service: Ophthalmology;  Laterality: Left;  CDE: 17.96   FOOT SURGERY     bilateral    VAGINAL HYSTERECTOMY       reports that she has never smoked. She has never used smokeless tobacco. She reports that she does not drink alcohol and does not use drugs.   Family History  Problem Relation Age of Onset   Heart disease Mother 17       CHF   COPD Father    Eczema Father    Colon cancer Other        Per patient unsure of what cancer in family     Physical Exam:   Vitals:   09/28/23 1107 09/28/23 1200 09/28/23 1416 09/28/23 1440  BP:  (!) 150/64 (!) 142/60 (!) 150/5  Pulse: 90 93 82 75  Resp: 16  18 18   Temp:   98.8 F (37.1 C) 97.8 F (36.6  C)  TempSrc:   Oral Oral  SpO2: 92% 98% 99% 97%      General:  AAO x 3,  cooperative, no distress;   HEENT:  Normocephalic, PERRL, otherwise with in Normal limits   Neuro:  CNII-XII intact. , normal motor and sensation, reflexes intact   Lungs:   Clear to auscultation BL, Respirations unlabored,  No wheezes / crackles  Cardio:    S1/S2, RRR, No murmure, No Rubs or Gallops   Abdomen:  Soft, non-tender, bowel sounds active all four quadrants, no guarding or peritoneal signs.  Muscular  skeletal:  Limited exam -global generalized weaknesses - in bed, able to move all 4 extremities,   2+ pulses,  symmetric, No pitting edema  Skin:  Dry,  warm to touch, negative for any Rashes,  Wounds: Please see nursing documentation       Labs on admission:    I have personally reviewed following labs and imaging studies  CBC: Recent Labs  Lab 09/26/23 1216 09/28/23 1115  WBC 2.8* 2.3*  NEUTROABS 2.1 1.7  HGB 7.3* 7.1*  HCT 21.6* 20.6*  MCV 115* 114.4*  PLT 126* 150   Basic Metabolic Panel: Recent Labs  Lab 09/26/23 1216 09/28/23 1115 09/28/23 1123  NA 137 133*  --   K 4.1 3.5  --   CL 99 99  --   CO2 23 25  --   GLUCOSE 94 103*  --   BUN 26 26*  --   CREATININE 0.95 0.88  --   CALCIUM 8.6* 8.7*  --   MG  --   --  2.1  PHOS  --   --  3.8   GFR: Estimated Creatinine Clearance: 43.8 mL/min (by C-G formula based on SCr of 0.88 mg/dL). Liver Function Tests: Recent Labs  Lab 09/26/23 1216 09/28/23 1115  AST 19 21  ALT 13 14  ALKPHOS 62 44  BILITOT 1.3* 1.5*  PROT 5.6* 5.6*  ALBUMIN 3.8 3.4*   No results for input(s): "LIPASE", "AMYLASE" in the last 168 hours. No results for input(s): "AMMONIA" in the last 168 hours. Coagulation Profile: Recent Labs  Lab 09/28/23 1115  INR 1.2   Anemia Panel: Recent Labs    09/26/23 1216 09/28/23 1225  VITAMINB12 <50* <50*  FOLATE 17.6 19.8  FERRITIN 341* 169  TIBC 212* 237*  IRON 92 92  RETICCTPCT 1.3 0.9   Urine  analysis:    Component Value Date/Time   COLORURINE YELLOW 12/27/2017 1700   APPEARANCEUR Cloudy (A) 07/14/2023 1411   LABSPEC 1.008 12/27/2017 1700   PHURINE 9.0 (H) 12/27/2017 1700   GLUCOSEU Negative 07/14/2023 1411   HGBUR NEGATIVE 12/27/2017 1700   BILIRUBINUR Negative 07/14/2023 1411   KETONESUR NEGATIVE 12/27/2017 1700   PROTEINUR Negative 07/14/2023 1411   PROTEINUR NEGATIVE 12/27/2017 1700   UROBILINOGEN negative 06/26/2015 0824   NITRITE Negative 07/14/2023 1411   NITRITE NEGATIVE 12/27/2017 1700   LEUKOCYTESUR Trace (A) 07/14/2023 1411    Last A1C:  No results found for: "HGBA1C"   Radiologic Exams on Admission:   No results found.  EKG:   Independently reviewed.  Orders placed or performed during the hospital encounter of 09/28/23   ED EKG   ED EKG   EKG 12-Lead   EKG 12-Lead   EKG 12-Lead   ---------------------------------------------------------------------------------------------------------------------------------------    Assessment / Plan:   Principal Problem:   Symptomatic anemia Active Problems:   B12 deficiency   Hypertension   Osteopenia   Vitamin D deficiency   Stenosis of carotid artery   Nonrheumatic aortic valve stenosis   Assessment and Plan: * Symptomatic anemia - Symptomatic anemia -with severe generalized weaknesses, mild tachycardia -Pancytopenia: WBC of 12.3, RBC 1.80, hemoglobin 7.1, hematocrit 20.6, MCV 14.4, MCH 39.4, RDW 21.9 -consistent with macrocytic anemia Due to B12 deficiency serum B12 less than 50  -Baseline hemoglobin 12.9 same time last year>>> now 7.1  -Per EDP: Hemoccult negative  -Replating B12 1000 mcg IM daily x 3  -Transfusing 2U PRBC (Pros and cons of blood transfusion discussed with the patient-expressed understanding and agreement)     Latest Ref Rng & Units 09/28/2023   11:15 AM 09/26/2023   12:16 PM 09/25/2022   12:26 PM  CBC  WBC 4.0 -  10.5 K/uL 2.3  2.8  4.0   Hemoglobin 12.0 - 15.0  g/dL 7.1  7.3  47.8   Hematocrit 36.0 - 46.0 % 20.6  21.6  39.0   Platelets 150 - 400 K/uL 150  126  216       B12 deficiency  hemoglobin 7.1, hematocrit 20.6, MCV 14.4, MCH 39.4, RDW 21.9 -consistent with macrocytic anemia Due to B12 deficiency serum B12 less than 50  -Initiating 1000 mcg of B12 IM daily x  3 days -Initiating p.o. B12 supplement  Hypertension - Stable, -Monitoring BP closely due to progressive anemia -Home medication of HCTZ and Norvasc reviewed will resume accordingly  Stenosis of carotid artery - Patient is not on any antiplatelet therapy -No history of stroke  Vitamin D deficiency - Continue vitamin D supplements  Osteopenia - Continue vitamin D supplement -Follow-up with PCP as an outpatient  Nonrheumatic aortic valve stenosis - Last echo 12/20/2022 :EJF; 65 to 70%. There is mild concentric left ventricular hypertrophy. Left ventricular diastolic function could not be evaluated due to mitral stenosis.   Mitral Valve: The mitral valve is degenerative,  moderate calcification of the mitral valve leaflet(s). Mild mitral valve regurgitation. Moderate mitral valve stenosis.   Tricuspid Valve: The tricuspid valve is grossly normal.Mild to moderate aortic  stenosis is present.   -Will continue to monitor -Patient is to follow-up as an outpatient with cardiology for valvular stenosis     Consults called:   GI  -------------------------------------------------------------------------------------------------------------------------------------------- DVT prophylaxis:  heparin injection 5,000 Units Start: 09/28/23 2200 TED hose Start: 09/28/23 1250 SCDs Start: 09/28/23 1250  Code Status:   Code Status: Full Code  Admission status: Patient will be admitted as Observation, with a greater than 2 midnight length of stay. Level of care: Telemetry  Family Communication:  none at bedside  (The above findings and plan of care has been discussed with patient  in detail, the patient expressed understanding and agreement of above plan)  ------------------------------------------------------------------------------------------------------------------------------------------  Disposition Plan:  Anticipated 1-2 days Status is: Observation The patient remains OBS appropriate and will d/c before 2 midnights.    -----------------------------------------------------------------------------------------------------------------------------------------  Time spent:  5  Min.  Was spent seeing and evaluating the patient, reviewing all medical records, drawn plan of care.  SIGNED: Kendell Bane, MD, FHM. FAAFP. [facility], Pager  (Please use [email or web address] to page/ or secure chat through epic) If 7PM-7AM, please contact night-coverage [email or web address],  09/28/2023, 2:49 PM

## 2023-09-29 ENCOUNTER — Ambulatory Visit

## 2023-09-29 ENCOUNTER — Telehealth: Payer: Self-pay | Admitting: Gastroenterology

## 2023-09-29 ENCOUNTER — Encounter (HOSPITAL_COMMUNITY): Payer: Self-pay | Admitting: Family Medicine

## 2023-09-29 DIAGNOSIS — Z7401 Bed confinement status: Secondary | ICD-10-CM | POA: Diagnosis not present

## 2023-09-29 DIAGNOSIS — D649 Anemia, unspecified: Secondary | ICD-10-CM | POA: Diagnosis not present

## 2023-09-29 DIAGNOSIS — D539 Nutritional anemia, unspecified: Secondary | ICD-10-CM

## 2023-09-29 DIAGNOSIS — E538 Deficiency of other specified B group vitamins: Secondary | ICD-10-CM

## 2023-09-29 DIAGNOSIS — R279 Unspecified lack of coordination: Secondary | ICD-10-CM | POA: Diagnosis not present

## 2023-09-29 LAB — TYPE AND SCREEN
ABO/RH(D): A POS
Antibody Screen: NEGATIVE
Unit division: 0
Unit division: 0

## 2023-09-29 LAB — BASIC METABOLIC PANEL
Anion gap: 10 (ref 5–15)
Anion gap: 12 (ref 5–15)
BUN: 20 mg/dL (ref 8–23)
BUN: 19 mg/dL (ref 8–23)
CO2: 29 mmol/L (ref 22–32)
CO2: 27 mmol/L (ref 22–32)
Calcium: 9 mg/dL (ref 8.9–10.3)
Calcium: 8.9 mg/dL (ref 8.9–10.3)
Chloride: 97 mmol/L — ABNORMAL LOW (ref 98–111)
Chloride: 96 mmol/L — ABNORMAL LOW (ref 98–111)
Creatinine, Ser: 0.64 mg/dL (ref 0.44–1.00)
Creatinine, Ser: 0.58 mg/dL (ref 0.44–1.00)
GFR, Estimated: >60 mL/min (ref >60)
GFR, Estimated: >60 mL/min (ref >60)
Glucose, Bld: 139 mg/dL — ABNORMAL HIGH (ref 70–99)
Glucose, Bld: 104 mg/dL — ABNORMAL HIGH (ref 70–99)
Potassium: 2.5 mmol/L — CL (ref 3.5–5.1)
Potassium: 3.4 mmol/L — ABNORMAL LOW (ref 3.5–5.1)
Sodium: 136 mmol/L (ref 135–145)
Sodium: 135 mmol/L (ref 135–145)

## 2023-09-29 LAB — VITAMIN B12: Vitamin B-12: >7500 pg/mL — ABNORMAL HIGH (ref 180–914)

## 2023-09-29 LAB — CBC
HCT: 29.4 % — ABNORMAL LOW (ref 36.0–46.0)
Hemoglobin: 10.4 g/dL — ABNORMAL LOW (ref 12.0–15.0)
MCH: 35.7 pg — ABNORMAL HIGH (ref 26.0–34.0)
MCHC: 35.4 g/dL (ref 30.0–36.0)
MCV: 101 fL — ABNORMAL HIGH (ref 80.0–100.0)
Platelets: 156 10*3/uL (ref 150–400)
RBC: 2.91 MIL/uL — ABNORMAL LOW (ref 3.87–5.11)
RDW: 25 % — ABNORMAL HIGH (ref 11.5–15.5)
WBC: 3.6 10*3/uL — ABNORMAL LOW (ref 4.0–10.5)
nRBC: 0.6 % — ABNORMAL HIGH (ref 0.0–0.2)

## 2023-09-29 LAB — BPAM RBC
Blood Product Expiration Date: 202504062359
Blood Product Expiration Date: 202504062359
ISSUE DATE / TIME: 202503161421
ISSUE DATE / TIME: 202503161733
Unit Type and Rh: 6200
Unit Type and Rh: 6200

## 2023-09-29 LAB — GLUCOSE, CAPILLARY: Glucose-Capillary: 117 mg/dL — ABNORMAL HIGH (ref 70–99)

## 2023-09-29 MED ORDER — POTASSIUM CHLORIDE CRYS ER 20 MEQ PO TBCR
60.0000 meq | EXTENDED_RELEASE_TABLET | Freq: Once | ORAL | Status: DC
  Filled 2023-09-29: qty 3

## 2023-09-29 MED ORDER — HALOPERIDOL LACTATE 5 MG/ML IJ SOLN: 2.0000 mg | Freq: Four times a day (QID) | INTRAMUSCULAR | Status: DC | PRN

## 2023-09-29 MED ORDER — FUROSEMIDE 40 MG PO TABS: 40.0000 mg | ORAL_TABLET | Freq: Every day | ORAL | 0 refills | Status: AC | PRN

## 2023-09-29 MED ORDER — LORAZEPAM 2 MG/ML IJ SOLN: 1.0000 mg | Freq: Once | INTRAMUSCULAR | Status: DC

## 2023-09-29 MED ORDER — POTASSIUM CHLORIDE 20 MEQ PO PACK: 20.0000 meq | PACK | ORAL | Status: DC

## 2023-09-29 MED ORDER — POTASSIUM CHLORIDE 10 MEQ/100ML IV SOLN
10.0000 meq | INTRAVENOUS | Status: DC
Start: 2023-09-29 — End: 2023-09-29
  Administered 2023-09-29 (×5): 10 meq via INTRAVENOUS
  Filled 2023-09-29 (×6): qty 100

## 2023-09-29 MED ORDER — FUROSEMIDE 10 MG/ML IJ SOLN
40.0000 mg | Freq: Two times a day (BID) | INTRAMUSCULAR | Status: DC
Start: 2023-09-29 — End: 2023-09-29

## 2023-09-29 MED ORDER — CYANOCOBALAMIN 1000 MCG/ML IJ SOLN
1000.0000 ug | Freq: Once | INTRAMUSCULAR | Status: AC
  Administered 2023-09-29: 1000 ug via INTRAMUSCULAR
  Filled 2023-09-29: qty 1

## 2023-09-29 MED ORDER — CYANOCOBALAMIN 1000 MCG PO TABS: 1000.0000 ug | ORAL_TABLET | Freq: Every day | ORAL | 2 refills | Status: AC

## 2023-09-29 MED ORDER — POTASSIUM CHLORIDE CRYS ER 20 MEQ PO TBCR: 20.0000 meq | EXTENDED_RELEASE_TABLET | Freq: Three times a day (TID) | ORAL | Status: DC

## 2023-09-29 MED ORDER — POTASSIUM CHLORIDE CRYS ER 10 MEQ PO TBCR: 20.0000 meq | EXTENDED_RELEASE_TABLET | Freq: Every day | ORAL | 0 refills | Status: AC

## 2023-09-29 MED ORDER — HALOPERIDOL LACTATE 5 MG/ML IJ SOLN
2.0000 mg | Freq: Three times a day (TID) | INTRAMUSCULAR | Status: DC | PRN
Start: 2023-09-29 — End: 2023-09-29

## 2023-09-29 MED ORDER — POTASSIUM CHLORIDE 10 MEQ/50ML IV SOLN
10.0000 meq | INTRAVENOUS | Status: DC
  Filled 2023-09-29 (×6): qty 50

## 2023-09-29 MED ORDER — POTASSIUM CHLORIDE 10 MEQ/100ML IV SOLN
10.0000 meq | Freq: Once | INTRAVENOUS | Status: DC
  Filled 2023-09-29: qty 100

## 2023-09-29 MED ORDER — POTASSIUM CHLORIDE CRYS ER 20 MEQ PO TBCR: 40.0000 meq | EXTENDED_RELEASE_TABLET | Freq: Once | ORAL | Status: DC

## 2023-09-29 MED ORDER — FOLIC ACID 1 MG PO TABS: 1.0000 mg | ORAL_TABLET | Freq: Every day | ORAL | 0 refills | Status: AC

## 2023-09-29 MED ORDER — VITAMIN B-12 1000 MCG PO TABS
1000.0000 ug | ORAL_TABLET | Freq: Every day | ORAL | Status: DC
  Administered 2023-09-29: 1000 ug via ORAL
  Filled 2023-09-29: qty 1

## 2023-09-29 MED ORDER — POTASSIUM CHLORIDE CRYS ER 20 MEQ PO TBCR
20.0000 meq | EXTENDED_RELEASE_TABLET | ORAL | Status: DC
  Administered 2023-09-29 (×2): 20 meq via ORAL

## 2023-09-29 NOTE — Discharge Summary (Signed)
 Physician Discharge Summary   Patient: Michelle [name] MRN: [id number] DOB: 06/13/1934  Admit date:     09/28/2023  Discharge date: 09/29/23  Discharge Physician: Kendell Bane   PCP: Dettinger, Elige Radon, MD   Recommendations at discharge:   Follow with PCP in 1-2 weeks Follow-up with hematologist in 1-2 weeks Continue vitamin B12 and folate supplements CBC in 1 week results to PCP Follow-up with a gastroenterologist in 4-6 weeks  Discharge Diagnoses: Principal Problem:   Symptomatic anemia Active Problems:   B12 deficiency   Hypertension   Osteopenia   Vitamin D deficiency   Stenosis of carotid artery   Nonrheumatic aortic valve stenosis  Resolved Problems:   * No resolved hospital problems. *  Hospital Course: Michelle [name] is a 89 year old female with extensive history of osteopenia, vitamin D deficiency, hypertension, seasonal allergies, aortic stenosis, GERD, IBS, uterine cancer.... Presented today from assisted living from PCP office for severe generalized weaknesses especially in lower extremities which was progressing past month.  Patient arrived from [facility] assisted living as the lab revealed drop in her hemoglobin and progressive weakness.Marland KitchenMarland KitchenShe denies any melena or hematochezia.   ED evaluation/course: Blood pressure (!) 150/64, pulse 93, temperature 98.1 F (36.7 C), resp. rate 16, SpO2 98%. On RA   Labs:  CBC; WBC of 2.3, RBC 1.8, hemoglobin 7.1, MCV 114.4, RDW 21.9, INR 1.2, Hemoccult negative per EDP B12 <50 Folic acid 17.6 Iron 92, TIBC 212, ferritin 341, iron sat 43, CMP; sodium 133, potassium 3.5, glucose 103, BUN 26, creatinine 0.87, calcium 8.7, albumin 2.4, GFR > 60   Requested for patient to be admitted: As her hemoglobin dropped from 12.9 last year to 7.1 now with severe B12 deficiency Further anemia workup and blood transfusion, as patient is symptomatic with severe generalized weaknesses   * Symptomatic anemia - Symptomatic anemia  -with severe generalized weaknesses, mild tachycardia -Pancytopenia: WBC of 12.3, RBC 1.80, hemoglobin 7.1, hematocrit 20.6, MCV 14.4, MCH 39.4, RDW 21.9 -consistent with macrocytic anemia Due to B12 deficiency serum B12 less than 50  -Baseline hemoglobin 12.9 same time last year>>> 7.1 >> 2 U PRBC >>10.4 today  -Per EDP: Hemoccult negative Iron/TIBC/Ferritin/ %Sat    Component Value Date/Time   IRON 92 09/28/2023 1225   IRON 92 09/26/2023 1216   TIBC 237 (L) 09/28/2023 1225   TIBC 212 (L) 09/26/2023 1216   FERRITIN 169 09/28/2023 1225   FERRITIN 341 (H) 09/26/2023 1216   IRONPCTSAT 39 (H) 09/28/2023 1225   IRONPCTSAT 43 09/26/2023 1216     -Replating B12 1000 mcg IM -to be continued orally  -Transfusing 2U PRBC (Pros and cons of blood transfusion discussed with the patient-expressed understanding and agreement)    Latest Ref Rng & Units 09/29/2023    4:13 AM 09/28/2023   11:15 AM 09/26/2023   12:16 PM  CBC  WBC 4.0 - 10.5 K/uL 3.6  2.3  2.8   Hemoglobin 12.0 - 15.0 g/dL 40.9  7.1  7.3   Hematocrit 36.0 - 46.0 % 29.4  20.6  21.6   Platelets 150 - 400 K/uL 156  150  126      B12 deficiency  hemoglobin 7.1, hematocrit 20.6, MCV 14.4, MCH 39.4, RDW 21.9 -consistent with macrocytic anemia Due to B12 deficiency serum B12 less than 50  -Initiating 1000 mcg of B12 IM >>> to be continued orally -Initiating p.o. B12 supplement  Hypertension - Stable, -Monitoring BP closely due to progressive anemia -Home medication of HCTZ and  Norvasc reviewed will resume accordingly  Stenosis of carotid artery - Patient is not on any antiplatelet therapy -No history of stroke  Vitamin D deficiency - Continue vitamin D supplements  Osteopenia - Continue vitamin D supplement -Follow-up with PCP as an outpatient  Nonrheumatic aortic valve stenosis - Last echo 12/20/2022 :EJF; 65 to 70%. There is mild concentric left ventricular hypertrophy. Left ventricular diastolic function could not  be evaluated due to mitral stenosis.   Mitral Valve: The mitral valve is degenerative,  moderate calcification of the mitral valve leaflet(s). Mild mitral valve regurgitation. Moderate mitral valve stenosis.   Tricuspid Valve: The tricuspid valve is grossly normal.Mild to moderate aortic  stenosis is present.   -Will continue to monitor -Patient is to follow-up as an outpatient with cardiology for valvular stenosis   Bilateral chronic lower extremity edema -Responded well to IV diuretics -Continue compression stockings -Will need as needed Lasix, with holding scheduled Lasix as patient's oral intake is unpredictable high risk for dehydration  Underlying dementia, with sundowning and delirium -Continue current home medication    Hypokalemia  -Calcium as low as 2.5, repleted orally 40 mg x 2    Disposition: Assisted living Diet recommendation:  Regular diet DISCHARGE MEDICATION: Allergies as of 09/29/2023       Reactions   Nitrofurantoin    Pt does not remember this medicine   Lisinopril Cough   Sulfonamide Derivatives Rash        Medication List     TAKE these medications    acetaminophen 500 MG tablet Commonly known as: TYLENOL Take 500 mg by mouth every 6 (six) hours as needed for headache.   amLODipine 2.5 MG tablet Commonly known as: NORVASC Take 3 tablets by mouth daily   anti-nausea solution Take 10 mLs by mouth every 15 (fifteen) minutes as needed for nausea or vomiting.   Benefiber Powd Take 1 Dose by mouth daily.   cetirizine 10 MG tablet Commonly known as: ZyrTEC Allergy Take 1 tablet (10 mg total) by mouth daily. As needed What changed:  when to take this reasons to take this additional instructions   cyanocobalamin 1000 MCG tablet Take 1 tablet (1,000 mcg total) by mouth daily.   folic acid 1 MG tablet Commonly known as: FOLVITE Take 1 tablet (1 mg total) by mouth daily.   furosemide 40 MG tablet Commonly known as: Lasix Take 1  tablet (40 mg total) by mouth daily as needed for fluid or edema (> Worsening bilateral lower extremity edema/weight gain in 24 hours 3-5 pounds).   hydrochlorothiazide 25 MG tablet Commonly known as: HYDRODIURIL Take 1 tablet (25 mg total) by mouth daily. **NEEDS TO BE SEEN BEFORE NEXT REFILL**   hydrOXYzine 25 MG capsule Commonly known as: VISTARIL Take 1 capsule (25 mg total) by mouth every 8 (eight) hours as needed for anxiety.   loperamide 2 MG capsule Commonly known as: IMODIUM Take 2 mg by mouth as needed for diarrhea or loose stools.   meclizine 25 MG tablet Commonly known as: ANTIVERT Take 1 tablet (25 mg total) by mouth 3 (three) times daily as needed for dizziness.   polyethylene glycol 17 g packet Commonly known as: MIRALAX / GLYCOLAX Take 17 g by mouth daily.   potassium chloride 10 MEQ tablet Commonly known as: KLOR-CON M Take 2 tablets (20 mEq total) by mouth daily.   sodium chloride 5 % ophthalmic solution Commonly known as: MURO 128 1 drop 3 (three) times daily. Affected eye(s)   traZODone  50 MG tablet Commonly known as: DESYREL Take 0.5 tablets (25 mg total) by mouth at bedtime as needed for sleep.   Vitamin D3 50 MCG (2000 UT) Tabs Take 2,000 Units by mouth daily.        Discharge Exam: Filed Weights   09/28/23 1450 09/29/23 0428  Weight: 66.9 kg 65.6 kg        General:  Pleasantly confused  AAO x 2,  cooperative, no distress;   HEENT:  Normocephalic, PERRL, otherwise with in Normal limits   Neuro:  CNII-XII intact. , normal motor and sensation, reflexes intact   Lungs:   Clear to auscultation BL, Respirations unlabored,  No wheezes / crackles  Cardio:    S1/S2, RRR, No murmure, No Rubs or Gallops   Abdomen:  Soft, non-tender, bowel sounds active all four quadrants, no guarding or peritoneal signs.  Muscular  skeletal:  Limited exam -global generalized weaknesses - in bed, able to move all 4 extremities,   2+ pulses,  symmetric, No  pitting edema  Skin:  Dry, warm to touch, negative for any Rashes,  Wounds: Please see nursing documentation          Condition at discharge: good  The results of significant diagnostics from this hospitalization (including imaging, microbiology, ancillary and laboratory) are listed below for reference.   Imaging Studies: No results found.  Microbiology: Results for orders placed or performed in visit on 08/05/23  COVID-19, Flu A+B and RSV     Status: Abnormal   Collection Time: 08/05/23 11:27 AM   Specimen: Nasopharyngeal(NP) swabs in vial transport medium  Result Value Ref Range Status   SARS-CoV-2, NAA Not Detected Not Detected Final   Influenza A, NAA Not Detected Not Detected Final   Influenza B, NAA Not Detected Not Detected Final   RSV, NAA Detected (A) Not Detected Final   Test Information: Comment  Final    Comment: This nucleic acid amplification test was developed and its performance characteristics determined by [employer]. Nucleic acid amplification tests include RT-PCR and TMA. This test has not been FDA cleared or approved. This test has been authorized by FDA under an Emergency Use Authorization (EUA). This test is only authorized for the duration of time the declaration that circumstances exist justifying the authorization of the emergency use of in vitro diagnostic tests for detection of SARS-CoV-2 virus and/or diagnosis of COVID-19 infection under section 564(b)(1) of the Act, 21 U.S.C. 528UXL-2(G) (1), unless the authorization is terminated or revoked sooner. When diagnostic testing is negative, the possibility of a false negative result should be considered in the context of a patient's recent exposures and the presence of clinical signs and symptoms consistent with COVID-19. An individual without symptoms of COVID-19 and who is not shedding SARS-CoV-2 virus wo uld expect to have a negative (not detected) result in this assay.      Labs: CBC: Recent Labs  Lab 09/26/23 1216 09/28/23 1115 09/29/23 0413  WBC 2.8* 2.3* 3.6*  NEUTROABS 2.1 1.7  --   HGB 7.3* 7.1* 10.4*  HCT 21.6* 20.6* 29.4*  MCV 115* 114.4* 101.0*  PLT 126* 150 156   Basic Metabolic Panel: Recent Labs  Lab 09/26/23 1216 09/28/23 1115 09/28/23 1123 09/29/23 0413 09/29/23 1359  NA 137 133*  --  136 135  K 4.1 3.5  --  2.5* 3.4*  CL 99 99  --  97* 96*  CO2 23 25  --  29 27  GLUCOSE 94 103*  --  139* 104*  BUN 26 26*  --  20 19  CREATININE 0.95 0.88  --  0.64 0.58  CALCIUM 8.6* 8.7*  --  9.0 8.9  MG  --   --  2.1  --   --   PHOS  --   --  3.8  --   --    Liver Function Tests: Recent Labs  Lab 09/26/23 1216 09/28/23 1115  AST 19 21  ALT 13 14  ALKPHOS 62 44  BILITOT 1.3* 1.5*  PROT 5.6* 5.6*  ALBUMIN 3.8 3.4*   CBG: Recent Labs  Lab 09/29/23 0743  GLUCAP 117*    Discharge time spent: greater than 30 minutes.  Signed: Kendell Bane, MD [facility] 09/29/2023

## 2023-09-29 NOTE — Progress Notes (Addendum)
 Nurse at bedside,patient refusing to take medications this am,patient combative and pushing me away..Dr Flossie Dibble notified. Patient has Recruitment consultant at bedside. Plan of care on going.

## 2023-09-29 NOTE — Plan of Care (Signed)
  Problem: Acute Rehab PT Goals(only PT should resolve) Goal: Pt Will Go Supine/Side To Sit Outcome: Progressing Flowsheets (Taken 09/29/2023 1110) Pt will go Supine/Side to Sit:  with modified independence  with minimal assist Goal: Patient Will Transfer Sit To/From Stand Outcome: Progressing Flowsheets (Taken 09/29/2023 1110) Patient will transfer sit to/from stand:  with modified independence  with minimal assist Goal: Pt Will Transfer Bed To Chair/Chair To Bed Outcome: Progressing Flowsheets (Taken 09/29/2023 1110) Pt will Transfer Bed to Chair/Chair to Bed:  with modified independence  with min assist Goal: Pt Will Ambulate Outcome: Progressing Flowsheets (Taken 09/29/2023 1110) Pt will Ambulate:  50 feet  with modified independence  with minimal assist  Luz Lex, PT, DPT [facility] Office: [phone]

## 2023-09-29 NOTE — Progress Notes (Signed)
 PROGRESS NOTE    Patient: Michelle [name]                            PCP: Dettinger, Elige Radon, MD                    DOB: 07/08/1934            DOA: 09/28/2023 [id number]:[id number]             DOS: 09/29/2023, 11:24 AM   LOS: 0 days   Date of Service: The patient was seen and examined on 09/29/2023  Subjective:   The patient was seen and examined this morning. 89% on room air overnight, currently satting 93% on 2 L Was agitated overnight,  Brief Narrative:   Michelle [name] is a 89 year old female with extensive history of osteopenia, vitamin D deficiency, hypertension, seasonal allergies, aortic stenosis, GERD, IBS, uterine cancer.... Presented today from assisted living from PCP office for severe generalized weaknesses especially in lower extremities which was progressing past month.  Patient arrived from [facility] assisted living as the lab revealed drop in her hemoglobin and progressive weakness.Marland KitchenMarland KitchenShe denies any melena or hematochezia.   ED evaluation/course: Blood pressure (!) 150/64, pulse 93, temperature 98.1 F (36.7 C), resp. rate 16, SpO2 98%. On RA   Labs:  CBC; WBC of 2.3, RBC 1.8, hemoglobin 7.1, MCV 114.4, RDW 21.9, INR 1.2, Hemoccult negative per EDP B12 <50 Folic acid 17.6 Iron 92, TIBC 212, ferritin 341, iron sat 43, CMP; sodium 133, potassium 3.5, glucose 103, BUN 26, creatinine 0.87, calcium 8.7, albumin 2.4, GFR > 60   Requested for patient to be admitted: As her hemoglobin dropped from 12.9 last year to 7.1 now with severe B12 deficiency Further anemia workup and blood transfusion, as patient is symptomatic with severe generalized weaknesses    Assessment & Plan:   Principal Problem:   Symptomatic anemia Active Problems:   B12 deficiency   Hypertension   Osteopenia   Vitamin D deficiency   Stenosis of carotid artery   Nonrheumatic aortic valve stenosis    Symptomatic anemia - Symptomatic anemia -with severe generalized weaknesses, mild  tachycardia -Pancytopenia: WBC of 12.3, RBC 1.80, hemoglobin 7.1, hematocrit 20.6, MCV 14.4, MCH 39.4, RDW 21.9 -consistent with macrocytic anemia Due to B12 deficiency serum B12 less than 50   -Baseline hemoglobin 12.9 same time last year>>> now 7.1   -Per EDP: Hemoccult negative   -Replating B12 1000 mcg IM -to be continued orally   -Transfusing 2U PRBC (Pros and cons of blood transfusion discussed with the patient-expressed understanding and agreement)     Latest Ref Rng & Units 09/29/2023    4:13 AM 09/28/2023   11:15 AM 09/26/2023   12:16 PM  CBC  WBC 4.0 - 10.5 K/uL 3.6  2.3  2.8   Hemoglobin 12.0 - 15.0 g/dL 46.9  7.1  7.3   Hematocrit 36.0 - 46.0 % 29.4  20.6  21.6   Platelets 150 - 400 K/uL 156  150  126         B12 deficiency  hemoglobin 7.1, hematocrit 20.6, MCV 14.4, MCH 39.4, RDW 21.9 -consistent with macrocytic anemia Due to B12 deficiency serum B12 less than 50   -Initiating 1000 mcg of B12 IM >>> to be continued orally -Initiating p.o. B12 supplement   Hypertension - Stable, -Monitoring BP closely due to progressive anemia -Home medication of HCTZ and Norvasc reviewed will  resume accordingly   Stenosis of carotid artery - Patient is not on any antiplatelet therapy -No history of stroke   Vitamin D deficiency - Continue vitamin D supplements   Osteopenia - Continue vitamin D supplement -Follow-up with PCP as an outpatient   Nonrheumatic aortic valve stenosis - Last echo 12/20/2022 :EJF; 65 to 70%. There is mild concentric left ventricular hypertrophy. Left ventricular diastolic function could not be evaluated due to mitral stenosis.    Mitral Valve: The mitral valve is degenerative,  moderate calcification of the mitral valve leaflet(s). Mild mitral valve regurgitation. Moderate mitral valve stenosis.   Tricuspid Valve: The tricuspid valve is grossly normal.Mild to moderate aortic  stenosis is present.    -Will continue to monitor -Patient is to  follow-up as an outpatient with cardiology for valvular stenosis     Bilateral chronic lower extremity edema -Responded well to IV diuretics -Continue compression stockings -Will need as needed Lasix, with holding scheduled Lasix as patient's oral intake is unpredictable high risk for dehydration   Underlying dementia, with sundowning and delirium -Continue current home medication       Hypokalemia  -Calcium as low as 2.5, repleted orally 40 mg x 2      ------------------------------------------------------------------------------------------------------------------------------------------------  DVT prophylaxis:  heparin injection 5,000 Units Start: 09/28/23 2200 Place TED hose Start: 09/28/23 1509 TED hose Start: 09/28/23 1250 SCDs Start: 09/28/23 1250   Code Status:   Code Status: Full Code  Family Communication: No family member present at bedside- -Advance care planning has been discussed.   Admission status:   Status is: Observation The patient remains OBS appropriate and will d/c before 2 midnights.   Disposition: From  - home             Planning for discharge in 1-2 days: to   Procedures:   No admission procedures for hospital encounter.   Antimicrobials:  Anti-infectives (From admission, onward)    None        Medication:   amLODipine  5 mg Oral Daily   cholecalciferol  2,000 Units Oral Daily   vitamin B-12  1,000 mcg Oral Daily   folic acid  1 mg Oral Daily   furosemide  40 mg Intravenous BID   heparin  5,000 Units Subcutaneous Q8H   polyethylene glycol  17 g Oral Daily   potassium chloride  20 mEq Oral TID   sodium chloride flush  3 mL Intravenous Q12H   traZODone  50 mg Oral QHS    acetaminophen **OR** acetaminophen, bisacodyl, haloperidol lactate, hydrALAZINE, HYDROmorphone (DILAUDID) injection, hydrOXYzine, ipratropium, levalbuterol, LORazepam, ondansetron **OR** ondansetron (ZOFRAN) IV, oxyCODONE, senna-docusate, sodium chloride  flush   Objective:   Vitals:   09/28/23 1957 09/28/23 2101 09/29/23 0428 09/29/23 1024  BP: (!) 137/58 (!) 139/59 (!) 142/70 (!) 143/96  Pulse: 77 76 94 88  Resp: 20 20 16 19   Temp: 98.5 F (36.9 C) 97.8 F (36.6 C) 98 F (36.7 C) 97.8 F (36.6 C)  TempSrc: Oral Oral Oral Axillary  SpO2: 100% 94% (!) 89% 93%  Weight:   65.6 kg   Height:        Intake/Output Summary (Last 24 hours) at 09/29/2023 1124 Last data filed at 09/29/2023 0911 Gross per 24 hour  Intake 734.5 ml  Output 2550 ml  Net -1815.5 ml   Filed Weights   09/28/23 1450 09/29/23 0428  Weight: 66.9 kg 65.6 kg     Physical examination:   Constitution: Pleasantly confused, Psychiatric:  Normal and stable mood and affect, cognition intact,   HEENT:        Normocephalic, PERRL, otherwise with in Normal limits  Chest:         Chest symmetric Cardio vascular:  S1/S2, RRR, No murmure, No Rubs or Gallops  pulmonary: Clear to auscultation bilaterally, respirations unlabored, negative wheezes / crackles Abdomen: Soft, non-tender, non-distended, bowel sounds,no masses, no organomegaly Muscular skeletal: Limited exam - in bed, able to move all 4 extremities,   Neuro: CNII-XII intact. , normal motor and sensation, reflexes intact  Extremities: No pitting edema lower extremities, +2 pulses  Skin: Dry, warm to touch, negative for any Rashes, No open wounds Wounds: per nursing documentation   ------------------------------------------------------------------------------------------------------------------------------------------    LABs:     Latest Ref Rng & Units 09/29/2023    4:13 AM 09/28/2023   11:15 AM 09/26/2023   12:16 PM  CBC  WBC 4.0 - 10.5 K/uL 3.6  2.3  2.8   Hemoglobin 12.0 - 15.0 g/dL 56.3  7.1  7.3   Hematocrit 36.0 - 46.0 % 29.4  20.6  21.6   Platelets 150 - 400 K/uL 156  150  126       Latest Ref Rng & Units 09/29/2023    4:13 AM 09/28/2023   11:15 AM 09/26/2023   12:16 PM  CMP  Glucose 70 -  99 mg/dL 875  643  94   BUN 8 - 23 mg/dL 20  26  26    Creatinine 0.44 - 1.00 mg/dL 3.29  5.18  8.41   Sodium 135 - 145 mmol/L 136  133  137   Potassium 3.5 - 5.1 mmol/L 2.5  3.5  4.1   Chloride 98 - 111 mmol/L 97  99  99   CO2 22 - 32 mmol/L 29  25  23    Calcium 8.9 - 10.3 mg/dL 9.0  8.7  8.6   Total Protein 6.5 - 8.1 g/dL  5.6  5.6   Total Bilirubin 0.0 - 1.2 mg/dL  1.5  1.3   Alkaline Phos 38 - 126 U/L  44  62   AST 15 - 41 U/L  21  19   ALT 0 - 44 U/L  14  13        Micro Results No results found for this or any previous visit (from the past 240 hours).  Radiology Reports No results found.  SIGNED: Kendell Bane, MD, FHM. FAAFP. [facility] - [facility] Time spent - 35 min.  In seeing, evaluating and examining the patient. Reviewing medical records, labs, drawn plan of care. [facility],  Pager (please use [email or web address] to page/ text) Please use Epic Secure Chat for non-urgent communication (7AM-7PM)  If 7PM-7AM, please contact night-coverage [email or web address], 09/29/2023, 11:24 AM

## 2023-09-29 NOTE — TOC Initial Note (Signed)
 Transition of Care [facility]) - Initial/Assessment Note    Patient Details  Name: Michelle [name] MRN: [id number] Date of Birth: 1933/11/01  [facility]) CM/SW Contact:    Villa Herb, LCSWA Phone Number: 09/29/2023, 1:26 PM  Clinical Narrative:                 CSW noted per chart review that pt arrived from [facility]. CSW spoke with Olegario Messier at ALF who states they assist pt with ADLs at baseline and pt walks with a walker. CSW reviewed PT note with Olegario Messier, they feel comfortable with pts return. CSW spoke with pts daughter who states that she prefers pt return to ALF with [facility] services. CSW spoke to [place] with [facility] who states they can accept [facility] referral. CSW requested [facility] orders. TOC to follow.   Expected Discharge Plan: Assisted Living Barriers to Discharge: Continued Medical Work up   Patient Goals and CMS Choice Patient states their goals for this hospitalization and ongoing recovery are:: return to ALF CMS Medicare.gov Compare Post Acute Care list provided to:: Patient Represenative (must comment) Choice offered to / list presented to : Adult Children      Expected Discharge Plan and Services In-house Referral: Clinical Social Work Discharge Planning Services: CM Consult Post Acute Care Choice: Home Health Living arrangements for the past 2 months: Assisted Living Facility                           HH Arranged: PT HH Agency: [facility] Date [facility] Agency Contacted: 09/29/23   Representative spoke with at [facility] Agency: Victorino Dike  Prior Living Arrangements/Services Living arrangements for the past 2 months: Assisted Living Facility Lives with:: Facility Resident Patient language and need for interpreter reviewed:: Yes Do you feel safe going back to the place where you live?: Yes      Need for Family Participation in Patient Care: Yes (Comment) Care giver support system in place?: Yes (comment) Current home services: DME Criminal  Activity/Legal Involvement Pertinent to Current Situation/Hospitalization: No - Comment as needed  Activities of Daily Living   ADL Screening (condition at time of admission) Independently performs ADLs?: No Does the patient have a NEW difficulty with bathing/dressing/toileting/self-feeding that is expected to last >3 days?: No Does the patient have a NEW difficulty with getting in/out of bed, walking, or climbing stairs that is expected to last >3 days?: No Does the patient have a NEW difficulty with communication that is expected to last >3 days?: No Is the patient deaf or have difficulty hearing?: No Does the patient have difficulty seeing, even when wearing glasses/contacts?: No Does the patient have difficulty concentrating, remembering, or making decisions?: Yes  Permission Sought/Granted                  Emotional Assessment         Alcohol / Substance Use: Not Applicable Psych Involvement: No (comment)  Admission diagnosis:  Symptomatic anemia [D64.9] Anemia, unspecified type [D64.9] Patient Active Problem List   Diagnosis Date Noted   Symptomatic anemia 09/28/2023   B12 deficiency 09/28/2023   Nonrheumatic aortic valve stenosis 09/07/2019   Stenosis of carotid artery 09/07/2019   Vitamin D deficiency 12/01/2014   Seasonal allergic rhinitis 07/07/2013   Osteopenia 03/10/2013   Hypertension 02/11/2013   Aortic valve disorder 11/24/2009   PCP:  Dettinger, Elige Radon, MD Pharmacy:   [facility] -  [place], [place] - [address] [place] [address] Phone: [phone] Fax: [phone]  [facility] [address], [place] - [address] [place] [address] [place] [address] Phone: [phone] Fax: [phone]  [facility] [place], [place] - [place] [address] [place] [address] Phone: [phone] Fax: [phone]     Social Drivers of Health (SDOH) Social  History: SDOH Screenings   Food Insecurity: No Food Insecurity (09/28/2023)  Housing: Low Risk  (09/28/2023)  Transportation Needs: No Transportation Needs (09/28/2023)  Utilities: Not At Risk (09/28/2023)  Alcohol Screen: Low Risk  (04/10/2021)  Depression (PHQ2-9): Low Risk  (09/25/2022)  Financial Resource Strain: Low Risk  (04/10/2021)  Physical Activity: Insufficiently Active (04/10/2021)  Social Connections: Unknown (09/28/2023)  Stress: No Stress Concern Present (04/10/2021)  Tobacco Use: Low Risk  (09/28/2023)   SDOH Interventions:     Readmission Risk Interventions     No data to display

## 2023-09-29 NOTE — Evaluation (Signed)
 Physical Therapy Evaluation Patient Details Name: Michelle [name] MRN: [id number] DOB: 26-May-1934 Today's Date: 09/29/2023  History of Present Illness  Michelle [name] is a 89 year old female with extensive history of osteopenia, vitamin D deficiency, hypertension, seasonal allergies, aortic stenosis, GERD, IBS, uterine cancer.... Presented today from assisted living from PCP office for severe generalized weaknesses especially in lower extremities which was progressing past month.     Patient arrived from [facility] assisted living as the lab revealed drop in her hemoglobin and progressive weakness.Marland KitchenMarland KitchenShe denies any melena or hematochezia.  Clinical Impression  Pt presents very confused and tired. Sitter states she did not sleep most of the night and has been in a confused states since they arrived. Pt demonstrates decreased LE strength and endurance with functional transfers and ambulation. Pt does demonstrate good potential with walking as she initiated stepping pattern but legs are too weak/shaky. Pt requires +1 mod assist throughout session today. Pt would continue to benefit from skilled acute PT for increased LE strength/endurance, and improved independence with gait for return to [facility]. PT recommends SNF at this time.      If plan is discharge home, recommend the following: A lot of help with walking and/or transfers;A lot of help with bathing/dressing/bathroom;Help with stairs or ramp for entrance;Assistance with feeding;Direct supervision/assist for medications management   Can travel by private vehicle   No    Equipment Recommendations    Recommendations for Other Services       Functional Status Assessment Patient has had a recent decline in their functional status and/or demonstrates limited ability to make significant improvements in function in a reasonable and predictable amount of time     Precautions / Restrictions Precautions Precautions: Fall Recall of  Precautions/Restrictions: Impaired Restrictions Weight Bearing Restrictions Per Provider Order: No      Mobility  Bed Mobility Overal bed mobility: Needs Assistance             General bed mobility comments: needs mod assistance    Transfers Overall transfer level: Needs assistance                 General transfer comment: +1 mod assist for all transfers    Ambulation/Gait Ambulation/Gait assistance: Mod assist Gait Distance (Feet): 5 Feet Assistive device: 1 person hand held assist (+1 mod assist) Gait Pattern/deviations: Decreased step length - right, Decreased step length - left, Decreased stride length Gait velocity: slow     General Gait Details: very shaky legs during gait, multiple verbal cues for increased step length, decreased balance and LE strenght obvious  Stairs            Wheelchair Mobility     Tilt Bed    Modified Rankin (Stroke Patients Only)       Balance Overall balance assessment: Needs assistance   Sitting balance-Leahy Scale: Fair     Standing balance support: Bilateral upper extremity supported Standing balance-Leahy Scale: Poor                               Pertinent Vitals/Pain Pain Assessment Pain Assessment: Faces Faces Pain Scale: Hurts little more Pain Location: back and legs Pain Intervention(s): Limited activity within patient's tolerance, Repositioned    Home Living Family/patient expects to be discharged to:: Assisted living                        Prior Function Prior Level of  Function : Needs assist;Independent/Modified Independent       Physical Assist : ADLs (physical)   ADLs (physical): Bathing;Dressing Mobility Comments: modified independent with rolling walker ADLs Comments: some assistance     Extremity/Trunk Assessment   Upper Extremity Assessment Upper Extremity Assessment: Overall WFL for tasks assessed;Generalized weakness    Lower Extremity  Assessment Lower Extremity Assessment: Overall WFL for tasks assessed;Generalized weakness (Pt unable to maintain full WB during standing.)       Communication   Communication Communication: Impaired Factors Affecting Communication: Hearing impaired    Cognition Arousal: Lethargic (sitter states she did not sleep much through the night)                             PT - Cognition Comments: Pt is confused and is not able to give much history Following commands: Impaired Following commands impaired: Follows one step commands inconsistently     Cueing Cueing Techniques: Verbal cues, Gestural cues, Visual cues     General Comments      Exercises     Assessment/Plan    PT Assessment Patient needs continued PT services  PT Problem List Decreased strength;Decreased activity tolerance;Decreased balance;Decreased mobility;Decreased cognition;Decreased safety awareness;Decreased knowledge of precautions;Pain       PT Treatment Interventions DME instruction;Gait training;Stair training;Functional mobility training;Therapeutic activities;Therapeutic exercise;Balance training;Neuromuscular re-education;Patient/family education    PT Goals (Current goals can be found in the Care Plan section)  Acute Rehab PT Goals Patient Stated Goal: to leave hospital PT Goal Formulation: With patient Time For Goal Achievement: 10/13/23 Potential to Achieve Goals: Good    Frequency Min 2X/week     Co-evaluation               AM-PAC PT "6 Clicks" Mobility  Outcome Measure Help needed turning from your back to your side while in a flat bed without using bedrails?: A Lot Help needed moving from lying on your back to sitting on the side of a flat bed without using bedrails?: A Lot Help needed moving to and from a bed to a chair (including a wheelchair)?: A Lot Help needed standing up from a chair using your arms (e.g., wheelchair or bedside chair)?: A Lot Help needed to walk in  hospital room?: A Lot Help needed climbing 3-5 steps with a railing? : Total 6 Click Score: 11    End of Session   Activity Tolerance: Patient limited by fatigue;Patient limited by pain Patient left: in bed;with call bell/phone within reach;with nursing/sitter in room Nurse Communication: Mobility status;Other (comment) (to be moved back to chair after she wakes, encourage eating) PT Visit Diagnosis: Unsteadiness on feet (R26.81);Other abnormalities of gait and mobility (R26.89);Muscle weakness (generalized) (M62.81)    Time: 1610-9604 PT Time Calculation (min) (ACUTE ONLY): 15 min   Charges:   PT Evaluation $PT Eval Moderate Complexity: 1 Mod PT Treatments $Therapeutic Activity: 8-22 mins PT General Charges $$ ACUTE PT VISIT: 1 Visit         Luz Lex, PT, DPT [facility] Office: [phone]

## 2023-09-29 NOTE — Progress Notes (Signed)
 Patient being discharged to [facility] to call and give report,no success at this time. [facility] to transport patient to awaiting facility. IV's discontinued catheter intact.

## 2023-09-29 NOTE — Progress Notes (Signed)
 CRITICAL VALUE STICKER  CRITICAL VALUE:Potassium 2.5  RECEIVER (on-site recipient of call):Jaymes Graff RN  DATE & TIME NOTIFIED: 09/29/23 @ 0520  MESSENGER (representative from lab):Ambrose Mantle  MD NOTIFIED: Andrez Grime  TIME OF NOTIFICATION:0530  RESPONSE: See orders

## 2023-09-29 NOTE — Consult Note (Addendum)
 Gastroenterology Consult   Referring Provider: Dr. Flossie Dibble Primary Care Physician:  Dettinger, Elige Radon, MD Primary Gastroenterologist:  Dr. Christella Hartigan previously, last seen in 2012.   Patient ID: Michelle [name]; [id number]; Oct 16, 1933   Admit date: 09/28/2023  LOS: 0 days   Date of Consultation: 09/29/2023  Reason for Consultation:  symptomatic anemia  History of Present Illness   Michelle [name] is an 89 y.o. year old female with a history of aortic stenosis, dementia, GERD, HTN, uterine cancer, diverticulosis, residing at [facility] and brought to ED yesterday evening after outpatient labs from PCP Friday completed due to increasing weakness revealed new onset macrocytic anemia with Hgb 7.3, Hct 21.6, B12 less than 50.   In the ED: Hgb 7.1, WBC count 2.3, RBC count 1.8, MCV 114, MCH 39.4, platelets 150, BUN 26, Tbili 1.5, Sodium 133, INR 1.2, ferritin 169, iron 92, iron sats 39, hemoccult negative in the ED. She received 2 units PRBCs. Hgb this morning 10.4.  Sitter at bedside Tax adviser). Patient with confusion and agitation overnight. Received Haldol 1 mg overnight, Geodon around 0340, hydroxyzine 25 mg X 2. She just fell asleep early this morning after a restless night. She will open her eyes to verbal stimuli but mumbles. Unable to obtain adequate history from patient.  Per nurse tech, no BM. No overt GI bleeding. She has been agitated this morning and refusing to take oral medications. Combative at times.   Contacted daughter, Michelle [name], at [phone]. Per daughter: normally alert, oriented, on the move. Daughter said patient was groggy this morning but improved. History of dementia with sundowning.   June 2024 ECHO: Mild to moderate AS, mild MR, normal LV function.    Colonoscopy 2011: diverticulosis, normal TI. Benign colonic mucosa.  EGD 2011: gastritis s/p biopsy. Negative H.pylori  Past Medical History:  Diagnosis Date   Aortic stenosis    Mild echo 2014    Diverticulosis    Duodenitis without mention of hemorrhage    Esophagitis, unspecified    GERD (gastroesophageal reflux disease)    Heart murmur    Hypertension    IBS (irritable bowel syndrome)    Uterine cancer [facility])     Past Surgical History:  Procedure Laterality Date   CATARACT EXTRACTION W/PHACO Right 06/01/2021   Procedure: CATARACT EXTRACTION PHACO AND INTRAOCULAR LENS PLACEMENT (IOC);  Surgeon: Fabio Pierce, MD;  Location: [facility];  Service: Ophthalmology;  Laterality: Right;  CDE 18.80   CATARACT EXTRACTION W/PHACO Left 07/20/2021   Procedure: CATARACT EXTRACTION PHACO AND INTRAOCULAR LENS PLACEMENT (IOC);  Surgeon: Fabio Pierce, MD;  Location: [facility];  Service: Ophthalmology;  Laterality: Left;  CDE: 17.96   FOOT SURGERY     bilateral    VAGINAL HYSTERECTOMY      Prior to Admission medications   Medication Sig Start Date End Date Taking? Authorizing Provider  acetaminophen (TYLENOL) 500 MG tablet Take 500 mg by mouth every 6 (six) hours as needed for headache.   Yes [provider]  amLODipine (NORVASC) 2.5 MG tablet Take 3 tablets by mouth daily 02/27/23 02/27/24 Yes Hochrein, Fayrene Fearing, MD  anti-nausea (EMETROL) solution Take 10 mLs by mouth every 15 (fifteen) minutes as needed for nausea or vomiting.   Yes [provider]  cetirizine (ZYRTEC ALLERGY) 10 MG tablet Take 1 tablet (10 mg total) by mouth daily. As needed Patient taking differently: Take 10 mg by mouth daily as needed for allergies or rhinitis. 11/27/16  Yes Daphine Deutscher Mary-Margaret, FNP  Cholecalciferol (  VITAMIN D3) 2000 UNITS TABS Take 2,000 Units by mouth daily.   Yes [provider]  hydrochlorothiazide (HYDRODIURIL) 25 MG tablet Take 1 tablet (25 mg total) by mouth daily. **NEEDS TO BE SEEN BEFORE NEXT REFILL** 04/15/23  Yes Dettinger, Elige Radon, MD  hydrOXYzine (VISTARIL) 25 MG capsule Take 1 capsule (25 mg total) by mouth every 8 (eight) hours as needed for anxiety. 06/20/23  Yes Dettinger,  Elige Radon, MD  loperamide (IMODIUM) 2 MG capsule Take 2 mg by mouth as needed for diarrhea or loose stools.   Yes [provider]  meclizine (ANTIVERT) 25 MG tablet Take 1 tablet (25 mg total) by mouth 3 (three) times daily as needed for dizziness. 09/25/22  Yes Dettinger, Elige Radon, MD  polyethylene glycol (MIRALAX / GLYCOLAX) packet Take 17 g by mouth daily. 12/27/17  Yes Loren Racer, MD  sodium chloride (MURO 128) 5 % ophthalmic solution 1 drop 3 (three) times daily. Affected eye(s) 09/12/21  Yes [provider]  traZODone (DESYREL) 50 MG tablet Take 0.5 tablets (25 mg total) by mouth at bedtime as needed for sleep. 06/20/23  Yes Dettinger, Elige Radon, MD  Wheat Dextrin (BENEFIBER) POWD Take 1 Dose by mouth daily.   Yes [provider]  cyanocobalamin 1000 MCG tablet Take 1 tablet (1,000 mcg total) by mouth daily. 09/29/23 12/28/23  Shahmehdi, Gemma Payor, MD  folic acid (FOLVITE) 1 MG tablet Take 1 tablet (1 mg total) by mouth daily. 09/29/23 10/29/23  Kendell Bane, MD    Current Facility-Administered Medications  Medication Dose Route Frequency Provider Last Rate Last Admin   acetaminophen (TYLENOL) tablet 650 mg  650 mg Oral Q6H PRN Shahmehdi, Seyed A, MD       Or   acetaminophen (TYLENOL) suppository 650 mg  650 mg Rectal Q6H PRN Shahmehdi, Seyed A, MD       amLODipine (NORVASC) tablet 5 mg  5 mg Oral Daily Shahmehdi, Seyed A, MD   5 mg at 09/28/23 1435   bisacodyl (DULCOLAX) EC tablet 5 mg  5 mg Oral Daily PRN Shahmehdi, Seyed A, MD       cholecalciferol (VITAMIN D3) 25 MCG (1000 UNIT) tablet 2,000 Units  2,000 Units Oral Daily Shahmehdi, Seyed A, MD       cyanocobalamin (VITAMIN B12) tablet 1,000 mcg  1,000 mcg Oral Daily Shahmehdi, Seyed A, MD       folic acid (FOLVITE) tablet 1 mg  1 mg Oral Daily Shahmehdi, Seyed A, MD   1 mg at 09/28/23 1435   furosemide (LASIX) injection 40 mg  40 mg Intravenous BID Shahmehdi, Seyed A, MD   40 mg at 09/28/23 1758   haloperidol  lactate (HALDOL) injection 2 mg  2 mg Intramuscular Q6H PRN Shahmehdi, Seyed A, MD       heparin injection 5,000 Units  5,000 Units Subcutaneous Q8H Shahmehdi, Seyed A, MD   5,000 Units at 09/29/23 0644   hydrALAZINE (APRESOLINE) injection 10 mg  10 mg Intravenous Q4H PRN Shahmehdi, Seyed A, MD       HYDROmorphone (DILAUDID) injection 0.5-1 mg  0.5-1 mg Intravenous Q2H PRN Shahmehdi, Seyed A, MD   0.5 mg at 09/28/23 2013   hydrOXYzine (ATARAX) tablet 25 mg  25 mg Oral TID PRN Nevin Bloodgood A, MD   25 mg at 09/28/23 2013   ipratropium (ATROVENT) nebulizer solution 0.5 mg  0.5 mg Nebulization Q6H PRN Kendell Bane, MD       levalbuterol (XOPENEX) nebulizer solution  0.63 mg  0.63 mg Nebulization Q6H PRN Shahmehdi, Seyed A, MD       LORazepam (ATIVAN) injection 0.5 mg  0.5 mg Intravenous Q4H PRN Mansy, Jan A, MD       ondansetron (ZOFRAN) tablet 4 mg  4 mg Oral Q6H PRN Shahmehdi, Seyed A, MD       Or   ondansetron (ZOFRAN) injection 4 mg  4 mg Intravenous Q6H PRN Shahmehdi, Seyed A, MD       oxyCODONE (Oxy IR/ROXICODONE) immediate release tablet 5 mg  5 mg Oral Q4H PRN Shahmehdi, Seyed A, MD       polyethylene glycol (MIRALAX / GLYCOLAX) packet 17 g  17 g Oral Daily Shahmehdi, Seyed A, MD   17 g at 09/28/23 1435   senna-docusate (Senokot-S) tablet 1 tablet  1 tablet Oral QHS PRN Shahmehdi, Seyed A, MD       sodium chloride flush (NS) 0.9 % injection 3 mL  3 mL Intravenous Q12H Shahmehdi, Seyed A, MD   3 mL at 09/28/23 2211   sodium chloride flush (NS) 0.9 % injection 3-10 mL  3-10 mL Intravenous PRN Nevin Bloodgood A, MD       traZODone (DESYREL) tablet 50 mg  50 mg Oral QHS Shahmehdi, Seyed A, MD   50 mg at 09/28/23 2012    Allergies as of 09/28/2023 - Review Complete 09/28/2023  Allergen Reaction Noted   Nitrofurantoin  11/21/2009   Lisinopril Cough 05/11/2014   Sulfonamide derivatives Rash 11/21/2009    Family History  Problem Relation Age of Onset   Heart disease Mother 29        CHF   COPD Father    Eczema Father    Colon cancer Other        Per patient unsure of what cancer in family     Social History   Socioeconomic History   Marital status: Married    Spouse name: Norva Pavlov   Number of children: 3   Years of education: 9   Highest education level: 9th grade  Occupational History   Occupation: retired  Tobacco Use   Smoking status: Never   Smokeless tobacco: Never  Vaping Use   Vaping status: Never Used  Substance and Sexual Activity   Alcohol use: No   Drug use: No   Sexual activity: Not Currently  Other Topics Concern   Not on file  Social History Narrative   Lives with husband in one level home with a basement.   Social Drivers of Corporate investment banker Strain: Low Risk  (04/10/2021)   Overall Financial Resource Strain (CARDIA)    Difficulty of Paying Living Expenses: Not hard at all  Food Insecurity: No Food Insecurity (09/28/2023)   Hunger Vital Sign    Worried About Running Out of Food in the Last Year: Never true    Ran Out of Food in the Last Year: Never true  Transportation Needs: No Transportation Needs (09/28/2023)   PRAPARE - Administrator, Civil Service (Medical): No    Lack of Transportation (Non-Medical): No  Physical Activity: Insufficiently Active (04/10/2021)   Exercise Vital Sign    Days of Exercise per Week: 7 days    Minutes of Exercise per Session: 10 min  Stress: No Stress Concern Present (04/10/2021)   [employer] of Occupational Health - Occupational Stress Questionnaire    Feeling of Stress : Only a little  Social Connections: Unknown (09/28/2023)   Social Connection and Isolation  Panel [NHANES]    Frequency of Communication with Friends and Family: More than three times a week    Frequency of Social Gatherings with Friends and Family: More than three times a week    Attends Religious Services: Patient unable to answer    Active Member of Clubs or Organizations: No    Attends Tax inspector Meetings: Never    Marital Status: Married  Catering manager Violence: Not At Risk (09/28/2023)   Humiliation, Afraid, Rape, and Kick questionnaire    Fear of Current or Ex-Partner: No    Emotionally Abused: No    Physically Abused: No    Sexually Abused: No     Review of Systems   Gen: Denies any fever, chills, loss of appetite, change in weight or weight loss CV: Denies chest pain, heart palpitations, syncope, edema  Resp: Denies shortness of breath with rest, cough, wheezing, coughing up blood, and pleurisy. GI: Denies vomiting blood, jaundice, and fecal incontinence.   Denies dysphagia or odynophagia. GU : Denies urinary burning, blood in urine, urinary frequency, and urinary incontinence. MS: Denies joint pain, limitation of movement, swelling, cramps, and atrophy.  Derm: Denies rash, itching, dry skin, hives. Psych: Denies depression, anxiety, memory loss, hallucinations, and confusion. Heme: Denies bruising or bleeding Neuro:  Denies any headaches, dizziness, paresthesias, shaking  Physical Exam   Vital Signs in last 24 hours: Temp:  [97.8 F (36.6 C)-98.8 F (37.1 C)] 98 F (36.7 C) (03/17 0428) Pulse Rate:  [75-101] 94 (03/17 0428) Resp:  [16-20] 16 (03/17 0428) BP: (113-150)/(57-70) 142/70 (03/17 0428) SpO2:  [89 %-100 %] 89 % (03/17 0428) Weight:  [65.6 kg-66.9 kg] 65.6 kg (03/17 0428)    General:   drowsy, opens eyes to verbal stimuli but agitated when trying to engage in conversation Head:  Normocephalic and atraumatic. Lungs:  Clear throughout to auscultation.    Heart:  pronounced systolic murmur Abdomen:  Soft, full lower abdomen but non-tense, nontender. +BS. Limited exam as patient continues to grab at bedcovers Rectal: deferred   Extremities:  Without edema. Neurologic:  unable to assess orientation due to cognitive status  Intake/Output from previous day: 03/16 0701 - 03/17 0700 In: 734.5 [P.O.:360; I.V.:4.5; Blood:370] Out: 2050  [Urine:2050] Intake/Output this shift: No intake/output data recorded.    Labs/Studies   Recent Labs Recent Labs    09/26/23 1216 09/28/23 1115 09/29/23 0413  WBC 2.8* 2.3* 3.6*  HGB 7.3* 7.1* 10.4*  HCT 21.6* 20.6* 29.4*  PLT 126* 150 156   BMET Recent Labs    09/26/23 1216 09/28/23 1115 09/29/23 0413  NA 137 133* 136  K 4.1 3.5 2.5*  CL 99 99 97*  CO2 23 25 29   GLUCOSE 94 103* 139*  BUN 26 26* 20  CREATININE 0.95 0.88 0.64  CALCIUM 8.6* 8.7* 9.0   LFT Recent Labs    09/26/23 1216 09/28/23 1115  PROT 5.6* 5.6*  ALBUMIN 3.8 3.4*  AST 19 21  ALT 13 14  ALKPHOS 62 44  BILITOT 1.3* 1.5*   PT/INR Recent Labs    09/28/23 1115  LABPROT 15.1  INR 1.2    Radiology/Studies No results found.   Assessment   SANYLA SUMMEY is an 89 y.o. year old female with a history of aortic stenosis, dementia, GERD, HTN, uterine cancer, diverticulosis, residing at [facility] and brought to ED yesterday evening after outpatient labs from PCP Friday completed due to increasing weakness revealed new onset macrocytic anemia with Hgb 7.3,  Hct 21.6, B12 less than 50.   Anemia: in setting of severe B12 deficiency. Ferritin 169, iron 92, iron sats 39, B12 less than 50, no overt GI bleeding, heme negative. Suspect pancytopenia driven by B12 deficiency. Hematology has been requested as well per hospitalist. Received 2 units PRBCs due to symptomatic anemia with Hgb improved to 10.4. Unable to obtain meaningful history from patient as she received multiple meds overnight for confusion/agitation (hx of dementia with sundowning). Last colonoscopy in 2011 with diverticulosis and EGD in 2011 with gastritis, negative H.pylori. As heme negative and no overt bleeding, recommend outpatient follow-up. Continue with B12 supplementation and can reassess as outpatient. Need to evaluate for potential causes of malabsorption. Will add on celiac labs. If any EGD needed as outpatient, would recommend ECHO  as she does have history of aortic stenosis and need to assess severity as may not be candidate for anesthesia at [facility] if severe.   Discussed with daughter over the phone after seeing patient, who states patient is less groggy this morning after I left. She does endorse patient has history of sundowning but is typically very active during the day.     Plan / Recommendations    B12 supplementation per hospitalist Will add on celiac labs We can see patient in follow-up as outpatient Agree with hematology consultation request     09/29/2023, 9:03 AM  Gelene Mink, PhD, ANP-BC [facility] Gastroenterology

## 2023-09-29 NOTE — Care Management Obs Status (Signed)
 MEDICARE OBSERVATION STATUS NOTIFICATION   Patient Details  Name: Michelle [name] MRN: [id number] Date of Birth: [birth year]   Medicare Observation Status Notification Given:  Yes (copy mailed certified to address on file)    Corey Harold 09/29/2023, 3:57 PM

## 2023-09-29 NOTE — TOC Transition Note (Signed)
 Transition of Care [facility]) - Discharge Note   Patient Details  Name: Michelle [name] MRN: [id number] Date of Birth: 1933-09-08  Transition of [facility] CM/SW Contact:  Villa Herb, LCSWA Phone Number: 09/29/2023, 3:59 PM  Clinical Narrative:    CSW updated that pt is medically stable for D/C today. CSW updated Olegario Messier with [facility] who states they can accept back. CSW faxed D/C summary and Fl2 to facility. CSW updated pts daughter of plan and she is agreeable to [facility] transport. HH set up with [facility], MD placed HH orders. TOC signing off.   Final next level of care: Assisted Living Barriers to Discharge: Barriers Resolved   Patient Goals and CMS Choice Patient states their goals for this hospitalization and ongoing recovery are:: return to ALF CMS Medicare.gov Compare Post Acute Care list provided to:: Patient Represenative (must comment) Choice offered to / list presented to : Adult Children      Discharge Placement                Patient to be transferred to facility by: [facility] Name of family member notified: Daughter Carollee Herter Patient and family notified of of transfer: 09/29/23  Discharge Plan and Services Additional resources added to the After Visit Summary for   In-house Referral: Clinical Social Work Discharge Planning Services: CM Consult Post Acute Care Choice: Home Health                    HH Arranged: [facility] Agency: [facility] Date [facility] Agency Contacted: 09/29/23   Representative spoke with at [facility] Agency: Victorino Dike  Social Drivers of Health (SDOH) Interventions SDOH Screenings   Food Insecurity: No Food Insecurity (09/28/2023)  Housing: Low Risk  (09/28/2023)  Transportation Needs: No Transportation Needs (09/28/2023)  Utilities: Not At Risk (09/28/2023)  Alcohol Screen: Low Risk  (04/10/2021)  Depression (PHQ2-9): Low Risk  (09/25/2022)  Financial Resource Strain: Low Risk  (04/10/2021)  Physical Activity: Insufficiently Active  (04/10/2021)  Social Connections: Unknown (09/28/2023)  Stress: No Stress Concern Present (04/10/2021)  Tobacco Use: Low Risk  (09/28/2023)     Readmission Risk Interventions     No data to display

## 2023-09-29 NOTE — [place] FL2 (Signed)
 [place] MEDICAID FL2 LEVEL OF CARE FORM     IDENTIFICATION  Patient Name: Michelle [name] Birthdate: 1934-02-17 Sex: female Admission Date (Current Location): 09/28/2023  [facility] and [place] Number:  [employer] and Address:  [facility],  [address] [place] [address]      Provider Number: [id number]  Attending Physician Name and Address:  Kendell Bane, MD  Relative Name and Phone Number:       Current Level of Care: Hospital Recommended Level of Care: Assisted Living Facility Prior Approval Number:    Date Approved/Denied:   PASRR Number:    Discharge Plan: Other (Comment) (ALF)    Current Diagnoses: Patient Active Problem List   Diagnosis Date Noted   Symptomatic anemia 09/28/2023   B12 deficiency 09/28/2023   Nonrheumatic aortic valve stenosis 09/07/2019   Stenosis of carotid artery 09/07/2019   Vitamin D deficiency 12/01/2014   Seasonal allergic rhinitis 07/07/2013   Osteopenia 03/10/2013   Hypertension 02/11/2013   Aortic valve disorder 11/24/2009    Orientation RESPIRATION BLADDER Height & Weight     Self, Place  Normal Continent Weight: 144 lb 10 oz (65.6 kg) Height:  5\' 7"  (170.2 cm)  BEHAVIORAL SYMPTOMS/MOOD NEUROLOGICAL BOWEL NUTRITION STATUS      Continent Diet (Regular)  AMBULATORY STATUS COMMUNICATION OF NEEDS Skin   Supervision Verbally Normal                       Personal Care Assistance Level of Assistance  Bathing, Feeding, Dressing Bathing Assistance: Limited assistance Feeding assistance: Independent Dressing Assistance: Limited assistance     Functional Limitations Info  Sight, Hearing, Speech Sight Info: Adequate Hearing Info: Adequate Speech Info: Adequate    SPECIAL CARE FACTORS FREQUENCY                       Contractures Contractures Info: Not present    Additional Factors Info  Code Status, Allergies Code Status Info: FULL Allergies Info: Nitrofurantoin, Lisinopril,  Sulfonamide Derivatives           Current Medications (09/29/2023):  This is the current hospital active medication list Current Facility-Administered Medications  Medication Dose Route Frequency Provider Last Rate Last Admin   acetaminophen (TYLENOL) tablet 650 mg  650 mg Oral Q6H PRN Shahmehdi, Seyed A, MD       Or   acetaminophen (TYLENOL) suppository 650 mg  650 mg Rectal Q6H PRN Shahmehdi, Seyed A, MD       amLODipine (NORVASC) tablet 5 mg  5 mg Oral Daily Shahmehdi, Seyed A, MD   5 mg at 09/29/23 1441   bisacodyl (DULCOLAX) EC tablet 5 mg  5 mg Oral Daily PRN Shahmehdi, Seyed A, MD       cholecalciferol (VITAMIN D3) 25 MCG (1000 UNIT) tablet 2,000 Units  2,000 Units Oral Daily Shahmehdi, Seyed A, MD   2,000 Units at 09/29/23 1441   cyanocobalamin (VITAMIN B12) tablet 1,000 mcg  1,000 mcg Oral Daily Shahmehdi, Seyed A, MD   1,000 mcg at 09/29/23 1442   folic acid (FOLVITE) tablet 1 mg  1 mg Oral Daily Shahmehdi, Seyed A, MD   1 mg at 09/29/23 1442   furosemide (LASIX) injection 40 mg  40 mg Intravenous BID Shahmehdi, Seyed A, MD       haloperidol lactate (HALDOL) injection 2 mg  2 mg Intramuscular Q8H PRN Shahmehdi, Gemma Payor, MD       heparin  injection 5,000 Units  5,000 Units Subcutaneous Q8H Shahmehdi, Seyed A, MD   5,000 Units at 09/29/23 1324   hydrALAZINE (APRESOLINE) injection 10 mg  10 mg Intravenous Q4H PRN Shahmehdi, Seyed A, MD       HYDROmorphone (DILAUDID) injection 0.5-1 mg  0.5-1 mg Intravenous Q2H PRN Nevin Bloodgood A, MD   0.5 mg at 09/28/23 2013   hydrOXYzine (ATARAX) tablet 25 mg  25 mg Oral TID PRN Nevin Bloodgood A, MD   25 mg at 09/28/23 2013   ipratropium (ATROVENT) nebulizer solution 0.5 mg  0.5 mg Nebulization Q6H PRN Shahmehdi, Gemma Payor, MD       levalbuterol (XOPENEX) nebulizer solution 0.63 mg  0.63 mg Nebulization Q6H PRN Shahmehdi, Seyed A, MD       LORazepam (ATIVAN) injection 0.5 mg  0.5 mg Intravenous Q4H PRN Mansy, Jan A, MD       ondansetron (ZOFRAN)  tablet 4 mg  4 mg Oral Q6H PRN Shahmehdi, Seyed A, MD       Or   ondansetron (ZOFRAN) injection 4 mg  4 mg Intravenous Q6H PRN Shahmehdi, Seyed A, MD       oxyCODONE (Oxy IR/ROXICODONE) immediate release tablet 5 mg  5 mg Oral Q4H PRN Shahmehdi, Seyed A, MD       polyethylene glycol (MIRALAX / GLYCOLAX) packet 17 g  17 g Oral Daily Shahmehdi, Seyed A, MD   17 g at 09/29/23 1441   potassium chloride (KLOR-CON) packet 20 mEq  20 mEq Oral Q4H Shahmehdi, Seyed A, MD       Or   potassium chloride SA (KLOR-CON M) CR tablet 20 mEq  20 mEq Oral Q4H Shahmehdi, Seyed A, MD   20 mEq at 09/29/23 1443   potassium chloride 10 mEq in 100 mL IVPB  10 mEq Intravenous Q1 Hr x 6 Shahmehdi, Seyed A, MD 100 mL/hr at 09/29/23 1439 10 mEq at 09/29/23 1439   senna-docusate (Senokot-S) tablet 1 tablet  1 tablet Oral QHS PRN Shahmehdi, Seyed A, MD       sodium chloride flush (NS) 0.9 % injection 3 mL  3 mL Intravenous Q12H Shahmehdi, Seyed A, MD   3 mL at 09/29/23 1056   sodium chloride flush (NS) 0.9 % injection 3-10 mL  3-10 mL Intravenous PRN Shahmehdi, Seyed A, MD       traZODone (DESYREL) tablet 50 mg  50 mg Oral QHS Shahmehdi, Seyed A, MD   50 mg at 09/28/23 2012     Discharge Medications: Allergies as of 09/29/2023       Reactions   Nitrofurantoin    Pt does not remember this medicine   Lisinopril Cough   Sulfonamide Derivatives Rash        Medication List     TAKE these medications    acetaminophen 500 MG tablet Commonly known as: TYLENOL Take 500 mg by mouth every 6 (six) hours as needed for headache.   amLODipine 2.5 MG tablet Commonly known as: NORVASC Take 3 tablets by mouth daily   anti-nausea solution Take 10 mLs by mouth every 15 (fifteen) minutes as needed for nausea or vomiting.   Benefiber Powd Take 1 Dose by mouth daily.   cetirizine 10 MG tablet Commonly known as: ZyrTEC Allergy Take 1 tablet (10 mg total) by mouth daily. As needed What changed:  when to take this reasons  to take this additional instructions   cyanocobalamin 1000 MCG tablet Take 1 tablet (1,000 mcg total) by mouth  daily.   folic acid 1 MG tablet Commonly known as: FOLVITE Take 1 tablet (1 mg total) by mouth daily.   furosemide 40 MG tablet Commonly known as: Lasix Take 1 tablet (40 mg total) by mouth daily as needed for fluid or edema (> Worsening bilateral lower extremity edema/weight gain in 24 hours 3-5 pounds).   hydrochlorothiazide 25 MG tablet Commonly known as: HYDRODIURIL Take 1 tablet (25 mg total) by mouth daily. **NEEDS TO BE SEEN BEFORE NEXT REFILL**   hydrOXYzine 25 MG capsule Commonly known as: VISTARIL Take 1 capsule (25 mg total) by mouth every 8 (eight) hours as needed for anxiety.   loperamide 2 MG capsule Commonly known as: IMODIUM Take 2 mg by mouth as needed for diarrhea or loose stools.   meclizine 25 MG tablet Commonly known as: ANTIVERT Take 1 tablet (25 mg total) by mouth 3 (three) times daily as needed for dizziness.   polyethylene glycol 17 g packet Commonly known as: MIRALAX / GLYCOLAX Take 17 g by mouth daily.   potassium chloride 10 MEQ tablet Commonly known as: KLOR-CON M Take 2 tablets (20 mEq total) by mouth daily.   sodium chloride 5 % ophthalmic solution Commonly known as: MURO 128 1 drop 3 (three) times daily. Affected eye(s)   traZODone 50 MG tablet Commonly known as: DESYREL Take 0.5 tablets (25 mg total) by mouth at bedtime as needed for sleep.   Vitamin D3 50 MCG (2000 UT) Tabs Take 2,000 Units by mouth daily.         Relevant Imaging Results:  Relevant Lab Results:   Additional Information SSN: [id number] [place]

## 2023-09-29 NOTE — Telephone Encounter (Signed)
 Mandy: needs hospital follow-up withme in about 4-6 weeks. Thanks!

## 2023-09-29 NOTE — Progress Notes (Signed)
 Nurse at bedside,patient ore alert at this time,was able to take her medications.Plan of care on going.

## 2023-09-30 DIAGNOSIS — R278 Other lack of coordination: Secondary | ICD-10-CM | POA: Diagnosis not present

## 2023-09-30 DIAGNOSIS — M6281 Muscle weakness (generalized): Secondary | ICD-10-CM | POA: Diagnosis not present

## 2023-09-30 DIAGNOSIS — I10 Essential (primary) hypertension: Secondary | ICD-10-CM | POA: Diagnosis not present

## 2023-09-30 LAB — TISSUE TRANSGLUTAMINASE, IGA: Tissue Transglutaminase Ab, IgA: <2 U/mL (ref 0–3)

## 2023-09-30 LAB — IGA: IgA: 109 mg/dL (ref 64–422)

## 2023-10-02 DIAGNOSIS — R278 Other lack of coordination: Secondary | ICD-10-CM | POA: Diagnosis not present

## 2023-10-02 DIAGNOSIS — M6281 Muscle weakness (generalized): Secondary | ICD-10-CM | POA: Diagnosis not present

## 2023-10-02 DIAGNOSIS — I10 Essential (primary) hypertension: Secondary | ICD-10-CM | POA: Diagnosis not present

## 2023-10-03 ENCOUNTER — Other Ambulatory Visit: Payer: Self-pay

## 2023-10-03 ENCOUNTER — Ambulatory Visit: Admitting: Nurse Practitioner

## 2023-10-03 ENCOUNTER — Emergency Department (HOSPITAL_COMMUNITY)
Admission: EM | Admit: 2023-10-03 | Discharge: 2023-10-04 | Disposition: A | Discharge status: Home / Self Care | Attending: Emergency Medicine | Admitting: Emergency Medicine

## 2023-10-03 ENCOUNTER — Emergency Department (HOSPITAL_COMMUNITY)

## 2023-10-03 ENCOUNTER — Encounter (HOSPITAL_COMMUNITY): Payer: Self-pay

## 2023-10-03 DIAGNOSIS — R011 Cardiac murmur, unspecified: Secondary | ICD-10-CM | POA: Insufficient documentation

## 2023-10-03 DIAGNOSIS — R531 Weakness: Secondary | ICD-10-CM | POA: Insufficient documentation

## 2023-10-03 DIAGNOSIS — R410 Disorientation, unspecified: Secondary | ICD-10-CM | POA: Insufficient documentation

## 2023-10-03 DIAGNOSIS — Z743 Need for continuous supervision: Secondary | ICD-10-CM | POA: Diagnosis not present

## 2023-10-03 DIAGNOSIS — R404 Transient alteration of awareness: Secondary | ICD-10-CM | POA: Diagnosis not present

## 2023-10-03 DIAGNOSIS — F0390 Unspecified dementia without behavioral disturbance: Secondary | ICD-10-CM | POA: Diagnosis not present

## 2023-10-03 DIAGNOSIS — R32 Unspecified urinary incontinence: Secondary | ICD-10-CM | POA: Diagnosis not present

## 2023-10-03 DIAGNOSIS — E876 Hypokalemia: Secondary | ICD-10-CM | POA: Insufficient documentation

## 2023-10-03 DIAGNOSIS — I700 Atherosclerosis of aorta: Secondary | ICD-10-CM | POA: Diagnosis not present

## 2023-10-03 LAB — COMPREHENSIVE METABOLIC PANEL
ALT: 14 U/L (ref 0–44)
AST: 21 U/L (ref 15–41)
Albumin: 3.5 g/dL (ref 3.5–5.0)
Alkaline Phosphatase: 49 U/L (ref 38–126)
Anion gap: 10 (ref 5–15)
BUN: 26 mg/dL — ABNORMAL HIGH (ref 8–23)
CO2: 29 mmol/L (ref 22–32)
Calcium: 9.1 mg/dL (ref 8.9–10.3)
Chloride: 96 mmol/L — ABNORMAL LOW (ref 98–111)
Creatinine, Ser: 0.72 mg/dL (ref 0.44–1.00)
GFR, Estimated: >60 mL/min (ref >60)
Glucose, Bld: 96 mg/dL (ref 70–99)
Potassium: 3.1 mmol/L — ABNORMAL LOW (ref 3.5–5.1)
Sodium: 135 mmol/L (ref 135–145)
Total Bilirubin: 1.6 mg/dL — ABNORMAL HIGH (ref 0.0–1.2)
Total Protein: 6 g/dL — ABNORMAL LOW (ref 6.5–8.1)

## 2023-10-03 LAB — CBC WITH DIFFERENTIAL/PLATELET
Abs Immature Granulocytes: 0.02 10*3/uL (ref 0.00–0.07)
Basophils Absolute: 0 10*3/uL (ref 0.0–0.1)
Basophils Relative: 0 %
Eosinophils Absolute: 0 10*3/uL (ref 0.0–0.5)
Eosinophils Relative: 1 %
HCT: 32.6 % — ABNORMAL LOW (ref 36.0–46.0)
Hemoglobin: 11.1 g/dL — ABNORMAL LOW (ref 12.0–15.0)
Immature Granulocytes: 1 %
Lymphocytes Relative: 14 %
Lymphs Abs: 0.6 10*3/uL — ABNORMAL LOW (ref 0.7–4.0)
MCH: 35.5 pg — ABNORMAL HIGH (ref 26.0–34.0)
MCHC: 34 g/dL (ref 30.0–36.0)
MCV: 104.2 fL — ABNORMAL HIGH (ref 80.0–100.0)
Monocytes Absolute: 0.8 10*3/uL (ref 0.1–1.0)
Monocytes Relative: 18 %
Neutro Abs: 2.8 10*3/uL (ref 1.7–7.7)
Neutrophils Relative %: 66 %
Platelets: 158 10*3/uL (ref 150–400)
RBC: 3.13 MIL/uL — ABNORMAL LOW (ref 3.87–5.11)
RDW: 22.6 % — ABNORMAL HIGH (ref 11.5–15.5)
Smear Review: ADEQUATE
WBC: 4.1 10*3/uL (ref 4.0–10.5)
nRBC: 0 % (ref 0.0–0.2)

## 2023-10-03 LAB — URINALYSIS, W/ REFLEX TO CULTURE (INFECTION SUSPECTED)
Bilirubin Urine: NEGATIVE
Glucose, UA: NEGATIVE mg/dL
Hgb urine dipstick: NEGATIVE
Ketones, ur: NEGATIVE mg/dL
Nitrite: NEGATIVE
Protein, ur: NEGATIVE mg/dL
Specific Gravity, Urine: 1.011 (ref 1.005–1.030)
pH: 6 (ref 5.0–8.0)

## 2023-10-03 LAB — RESP PANEL BY RT-PCR (RSV, FLU A&B, COVID)  RVPGX2
Influenza A by PCR: NEGATIVE
Influenza B by PCR: NEGATIVE
Resp Syncytial Virus by PCR: NEGATIVE
SARS Coronavirus 2 by RT PCR: NEGATIVE

## 2023-10-03 LAB — TROPONIN I (HIGH SENSITIVITY): Troponin I (High Sensitivity): 17 ng/L (ref <18)

## 2023-10-03 MED ORDER — SODIUM CHLORIDE 0.9 % IV BOLUS
1000.0000 mL | Freq: Once | INTRAVENOUS | Status: AC
  Administered 2023-10-03: 1000 mL via INTRAVENOUS

## 2023-10-03 MED ORDER — POTASSIUM CHLORIDE CRYS ER 20 MEQ PO TBCR
40.0000 meq | EXTENDED_RELEASE_TABLET | Freq: Once | ORAL | Status: AC
  Administered 2023-10-03: 40 meq via ORAL
  Filled 2023-10-03: qty 2

## 2023-10-03 NOTE — Discharge Instructions (Addendum)
 You are seen in the emergency department for general weakness and possibly some urinary symptoms.  Your urine did not show an obvious sign of infection and your COVID and flu test were negative.  Your lab work showed a mildly low potassium and you were given some potassium.  Please follow-up with your primary care doctor for further evaluation.  Return to the emergency department if any worsening or concerning symptoms

## 2023-10-03 NOTE — ED Triage Notes (Signed)
 Pt bib [facility] from [facility] of [address]. For c/o weakness and urinary incontinence. Facility wants her checked for the flu d/t them having it in their building, also wants her checked for a UTI.

## 2023-10-03 NOTE — ED Provider Notes (Signed)
 [place] EMERGENCY DEPARTMENT AT [facility] Provider Note   CSN: [id number] Arrival date & time: 10/03/23  1041     History  Chief Complaint  Patient presents with   Weakness    Michelle [name] is a 89 y.o. female.  She has a history of dementia, level 5 caveat.  Patient was brought in by ambulance from her facility for concerns of weakness and urinary incontinence.  Reportedly there is flu going around their building and they also want to make sure she does not have a urinary tract infection.  Patient denies complaints, does not know why she is here.  She said she has had urinary tract infections in the past.  She does not remember if she has any discomfort with urination.  Of note she was admitted last week for weakness and found to be anemic, got 2 units.  Cause of anemia was unclear, was guaiac negative seen by GI and felt to be appropriate for outpatient follow-up.  Was given B12 supplementation.  The history is provided by the patient and the EMS personnel.  Weakness Severity:  Unable to specify Onset quality:  Unable to specify      Home Medications Prior to Admission medications   Medication Sig Start Date End Date Taking? Authorizing Provider  acetaminophen (TYLENOL) 500 MG tablet Take 500 mg by mouth every 6 (six) hours as needed for headache.    [provider]  amLODipine (NORVASC) 2.5 MG tablet Take 3 tablets by mouth daily 02/27/23 02/27/24  Rollene Rotunda, MD  anti-nausea (EMETROL) solution Take 10 mLs by mouth every 15 (fifteen) minutes as needed for nausea or vomiting.    [provider]  cetirizine (ZYRTEC ALLERGY) 10 MG tablet Take 1 tablet (10 mg total) by mouth daily. As needed Patient taking differently: Take 10 mg by mouth daily as needed for allergies or rhinitis. 11/27/16   Daphine Deutscher Mary-Margaret, FNP  Cholecalciferol (VITAMIN D3) 2000 UNITS TABS Take 2,000 Units by mouth daily.    [provider]  cyanocobalamin 1000 MCG  tablet Take 1 tablet (1,000 mcg total) by mouth daily. 09/29/23 12/28/23  Shahmehdi, Gemma Payor, MD  folic acid (FOLVITE) 1 MG tablet Take 1 tablet (1 mg total) by mouth daily. 09/29/23 10/29/23  Kendell Bane, MD  furosemide (LASIX) 40 MG tablet Take 1 tablet (40 mg total) by mouth daily as needed for fluid or edema (> Worsening bilateral lower extremity edema/weight gain in 24 hours 3-5 pounds). 09/29/23   Shahmehdi, Gemma Payor, MD  hydrochlorothiazide (HYDRODIURIL) 25 MG tablet Take 1 tablet (25 mg total) by mouth daily. **NEEDS TO BE SEEN BEFORE NEXT REFILL** 04/15/23   Dettinger, Elige Radon, MD  hydrOXYzine (VISTARIL) 25 MG capsule Take 1 capsule (25 mg total) by mouth every 8 (eight) hours as needed for anxiety. 06/20/23   Dettinger, Elige Radon, MD  loperamide (IMODIUM) 2 MG capsule Take 2 mg by mouth as needed for diarrhea or loose stools.    [provider]  meclizine (ANTIVERT) 25 MG tablet Take 1 tablet (25 mg total) by mouth 3 (three) times daily as needed for dizziness. 09/25/22   Dettinger, Elige Radon, MD  polyethylene glycol (MIRALAX / GLYCOLAX) packet Take 17 g by mouth daily. 12/27/17   Loren Racer, MD  potassium chloride (KLOR-CON M) 10 MEQ tablet Take 2 tablets (20 mEq total) by mouth daily. 09/29/23 10/29/23  Shahmehdi, Gemma Payor, MD  sodium chloride (MURO 128) 5 % ophthalmic solution 1 drop 3 (  three) times daily. Affected eye(s) 09/12/21   [provider]  traZODone (DESYREL) 50 MG tablet Take 0.5 tablets (25 mg total) by mouth at bedtime as needed for sleep. 06/20/23   Dettinger, Elige Radon, MD  Wheat Dextrin (BENEFIBER) POWD Take 1 Dose by mouth daily.    [provider]      Allergies    Nitrofurantoin, Lisinopril, and Sulfonamide derivatives    Review of Systems   Review of Systems  Unable to perform ROS: Dementia  Neurological:  Positive for weakness.    Physical Exam Updated Vital Signs BP (!) 150/71   Pulse 83   Temp 98.6 F (37 C) (Oral)   Resp 16   Ht  5\' 7"  (1.702 m)   Wt 65.6 kg   SpO2 97%   BMI 22.65 kg/m  Physical Exam Vitals and nursing note reviewed.  Constitutional:      General: She is not in acute distress.    Appearance: Normal appearance. She is well-developed.  HENT:     Head: Normocephalic and atraumatic.  Eyes:     Conjunctiva/sclera: Conjunctivae normal.  Cardiovascular:     Rate and Rhythm: Normal rate and regular rhythm.     Heart sounds: Murmur heard.  Pulmonary:     Effort: Pulmonary effort is normal. No respiratory distress.     Breath sounds: Normal breath sounds.  Abdominal:     Palpations: Abdomen is soft.     Tenderness: There is no abdominal tenderness. There is no guarding or rebound.  Musculoskeletal:        General: No deformity.     Cervical back: Neck supple.  Skin:    General: Skin is warm and dry.     Capillary Refill: Capillary refill takes less than 2 seconds.  Neurological:     General: No focal deficit present.     Mental Status: She is alert. She is disoriented.     Motor: No weakness.     ED Results / Procedures / Treatments   Labs (all labs ordered are listed, but only abnormal results are displayed) Labs Reviewed  CBC WITH DIFFERENTIAL/PLATELET - Abnormal; Notable for the following components:      Result Value   RBC 3.13 (*)    Hemoglobin 11.1 (*)    HCT 32.6 (*)    MCV 104.2 (*)    MCH 35.5 (*)    RDW 22.6 (*)    Lymphs Abs 0.6 (*)    All other components within normal limits  URINALYSIS, W/ REFLEX TO CULTURE (INFECTION SUSPECTED) - Abnormal; Notable for the following components:   APPearance HAZY (*)    Leukocytes,Ua SMALL (*)    Bacteria, UA RARE (*)    All other components within normal limits  COMPREHENSIVE METABOLIC PANEL - Abnormal; Notable for the following components:   Potassium 3.1 (*)    Chloride 96 (*)    BUN 26 (*)    Total Protein 6.0 (*)    Total Bilirubin 1.6 (*)    All other components within normal limits  RESP PANEL BY RT-PCR (RSV, FLU A&B,  COVID)  [id number]  TROPONIN I (HIGH SENSITIVITY)    EKG EKG Interpretation Date/Time:  Friday October 03 2023 11:05:36 EDT Ventricular Rate:  81 PR Interval:  239 QRS Duration:  105 QT Interval:  408 QTC Calculation: 474 R Axis:   -40  Text Interpretation: Sinus rhythm Atrial premature complex Prolonged PR interval Left axis deviation Borderline low voltage, extremity leads Probable anteroseptal  infarct, old No significant change since prior 3/25 Confirmed by Meridee Score [phone]) on 10/03/2023 11:08:29 AM  Radiology DG Chest Port 1 View Result Date: 10/03/2023 CLINICAL DATA:  Weakness EXAM: PORTABLE CHEST 1 VIEW COMPARISON:  X-ray 08/05/2023 FINDINGS: Hyperinflation. The right inferior costophrenic angle is clipped off the edge of the film. Question tiny effusions. Stable cardiopericardial silhouette calcified aorta. Interstitial changes are un changed from previous. Possibly chronic. Calcified aorta. Overlapping cardiac leads. Artifact from the patient's clothing. IMPRESSION: Hyperinflation with chronic interstitial appearing changes. Question tiny effusions. Calcified aorta Electronically Signed   By: Karen Kays M.D.   On: 10/03/2023 11:57    Procedures Procedures    Medications Ordered in ED Medications  sodium chloride 0.9 % bolus 1,000 mL (1,000 mLs Intravenous Bolus 10/03/23 1154)  potassium chloride SA (KLOR-CON M) CR tablet 40 mEq (40 mEq Oral Given 10/03/23 1316)    ED Course/ Medical Decision Making/ A&P Clinical Course as of 10/04/23 1914  Fri Oct 03, 2023  1201 Chest x-ray interpreted by me as no definite infiltrate.  Possibly small pleural effusions.  Awaiting radiology reading. [MB]    Clinical Course User Index [MB] Terrilee Files, MD                                 Medical Decision Making Amount and/or Complexity of Data Reviewed Labs: ordered. Radiology: ordered.  Risk Prescription drug management.   This patient complains of nothing.  Staff is  concerned for some lethargy and possible flu or UTI; this involves an extensive number of treatment Options and is a complaint that carries with it a high risk of complications and morbidity. The differential includes infection, dehydration, dementia  I ordered, reviewed and interpreted labs, which included CBC with normal white count, low stable hemoglobin, chemistries with low potassium, urinalysis without clear signs of infection, COVID and flu negative, troponin unremarkable I ordered medication oral potassium IV fluids and reviewed PMP when indicated. I ordered imaging studies which included chest x-ray and I independently    visualized and interpreted imaging which showed hyperinflation possible small effusions Additional history obtained from EMS Previous records obtained and reviewed in epic, was admitted last week for anemia and weakness, transfused Cardiac monitoring reviewed, normal sinus rhythm Social determinants considered, no significant barriers Critical Interventions: None  After the interventions stated above, I reevaluated the patient and found patient to be resting comfortably and hemodynamically stable Admission and further testing considered, no indications for admission at this time.  Will have her return back to her facility where they can continue to monitor her.         Final Clinical Impression(s) / ED Diagnoses Final diagnoses:  Generalized weakness  Hypokalemia    Rx / DC Orders ED Discharge Orders     None         Terrilee Files, MD 10/04/23 [phone]

## 2023-10-03 NOTE — ED Notes (Signed)
 Pt received tray and is feeding self with set up help.

## 2023-10-03 NOTE — ED Notes (Signed)
 [facility] called to transport patient back to facility. Nurse aware.

## 2023-10-04 DIAGNOSIS — Z7401 Bed confinement status: Secondary | ICD-10-CM | POA: Diagnosis not present

## 2023-10-04 DIAGNOSIS — R6889 Other general symptoms and signs: Secondary | ICD-10-CM | POA: Diagnosis not present

## 2023-10-04 NOTE — ED Notes (Signed)
 Pt called out wanting to get up and go to bathroom. Encouraged pt to use her diaper and we would get it changed quickly, and offered bedpan if she preferred.. She asked about Michelle [name], and was confused as to why she is here, and why it is so cold in her room. Added another warm blanket to bed.

## 2023-10-05 DIAGNOSIS — D649 Anemia, unspecified: Secondary | ICD-10-CM | POA: Diagnosis not present

## 2023-10-05 DIAGNOSIS — D61818 Other pancytopenia: Secondary | ICD-10-CM | POA: Diagnosis not present

## 2023-10-05 DIAGNOSIS — K219 Gastro-esophageal reflux disease without esophagitis: Secondary | ICD-10-CM | POA: Diagnosis not present

## 2023-10-05 DIAGNOSIS — E559 Vitamin D deficiency, unspecified: Secondary | ICD-10-CM | POA: Diagnosis not present

## 2023-10-05 DIAGNOSIS — Z556 Problems related to health literacy: Secondary | ICD-10-CM | POA: Diagnosis not present

## 2023-10-05 DIAGNOSIS — I10 Essential (primary) hypertension: Secondary | ICD-10-CM | POA: Diagnosis not present

## 2023-10-05 DIAGNOSIS — I080 Rheumatic disorders of both mitral and aortic valves: Secondary | ICD-10-CM | POA: Diagnosis not present

## 2023-10-05 DIAGNOSIS — E538 Deficiency of other specified B group vitamins: Secondary | ICD-10-CM | POA: Diagnosis not present

## 2023-10-05 DIAGNOSIS — E876 Hypokalemia: Secondary | ICD-10-CM | POA: Diagnosis not present

## 2023-10-05 DIAGNOSIS — I6529 Occlusion and stenosis of unspecified carotid artery: Secondary | ICD-10-CM | POA: Diagnosis not present

## 2023-10-05 DIAGNOSIS — M8580 Other specified disorders of bone density and structure, unspecified site: Secondary | ICD-10-CM | POA: Diagnosis not present

## 2023-10-05 DIAGNOSIS — K589 Irritable bowel syndrome without diarrhea: Secondary | ICD-10-CM | POA: Diagnosis not present

## 2023-10-07 ENCOUNTER — Telehealth: Payer: Self-pay | Admitting: Family Medicine

## 2023-10-07 NOTE — Telephone Encounter (Signed)
 Apt scheduled  Copied from CRM [id number]. Topic: Appointments - Scheduling Inquiry for Clinic >> Oct 07, 2023 11:23 AM Elle L wrote: Reason for CRM: Kennith Center with [name] called on behalf of the patient to schedule a hospital follow up with Dr. Louanne Skye. She was admitted 3/21 for weakness. However, I did not have an appointment available until 4/21. Her call back personal number is [phone] or the office is [phone]

## 2023-10-07 NOTE — Telephone Encounter (Signed)
 Copied from CRM [id number]. Topic: Clinical - Home Health Verbal Orders >> Oct 07, 2023  1:30 PM Franchot Heidelberg wrote: Caller/Agency: [facility]  Callback Number: [phone] Service Requested: Physical Therapy Frequency: 2w2 1w3 Any new concerns about the patient? No

## 2023-10-07 NOTE — Telephone Encounter (Signed)
 Left detailed message.

## 2023-10-09 DIAGNOSIS — I10 Essential (primary) hypertension: Secondary | ICD-10-CM | POA: Diagnosis not present

## 2023-10-09 DIAGNOSIS — R278 Other lack of coordination: Secondary | ICD-10-CM | POA: Diagnosis not present

## 2023-10-09 DIAGNOSIS — M6281 Muscle weakness (generalized): Secondary | ICD-10-CM | POA: Diagnosis not present

## 2023-10-14 DIAGNOSIS — M6281 Muscle weakness (generalized): Secondary | ICD-10-CM | POA: Diagnosis not present

## 2023-10-14 DIAGNOSIS — R278 Other lack of coordination: Secondary | ICD-10-CM | POA: Diagnosis not present

## 2023-10-14 DIAGNOSIS — I10 Essential (primary) hypertension: Secondary | ICD-10-CM | POA: Diagnosis not present

## 2023-10-16 ENCOUNTER — Ambulatory Visit (INDEPENDENT_AMBULATORY_CARE_PROVIDER_SITE_OTHER): Admitting: Family Medicine

## 2023-10-16 ENCOUNTER — Encounter: Payer: Self-pay | Admitting: Family Medicine

## 2023-10-16 VITALS — BP 131/52 | HR 84 | Ht 67.0 in | Wt 147.0 lb

## 2023-10-16 DIAGNOSIS — F419 Anxiety disorder, unspecified: Secondary | ICD-10-CM

## 2023-10-16 DIAGNOSIS — R278 Other lack of coordination: Secondary | ICD-10-CM | POA: Diagnosis not present

## 2023-10-16 DIAGNOSIS — G301 Alzheimer's disease with late onset: Secondary | ICD-10-CM

## 2023-10-16 DIAGNOSIS — I10 Essential (primary) hypertension: Secondary | ICD-10-CM | POA: Diagnosis not present

## 2023-10-16 DIAGNOSIS — R531 Weakness: Secondary | ICD-10-CM

## 2023-10-16 DIAGNOSIS — D649 Anemia, unspecified: Secondary | ICD-10-CM

## 2023-10-16 DIAGNOSIS — E538 Deficiency of other specified B group vitamins: Secondary | ICD-10-CM | POA: Diagnosis not present

## 2023-10-16 DIAGNOSIS — F02B0 Dementia in other diseases classified elsewhere, moderate, without behavioral disturbance, psychotic disturbance, mood disturbance, and anxiety: Secondary | ICD-10-CM

## 2023-10-16 DIAGNOSIS — M6281 Muscle weakness (generalized): Secondary | ICD-10-CM | POA: Diagnosis not present

## 2023-10-16 MED ORDER — CLONAZEPAM 0.5 MG PO TABS: 0.5000 mg | ORAL_TABLET | Freq: Two times a day (BID) | ORAL | 5 refills | Status: AC | PRN

## 2023-10-16 NOTE — Progress Notes (Signed)
 BP (!) 131/52   Pulse 84   Ht 5\' 7"  (1.702 m)   Wt 147 lb (66.7 kg)   SpO2 97%   BMI 23.02 kg/m    Subjective:   Patient ID: Michelle [name], female    DOB: 09-10-1933, 89 y.o.   MRN: [id number]  HPI: Michelle [name] is a 89 y.o. female presenting on 10/16/2023 for Hospitalization Follow-up (Weakness, low potassium) and Anxiety   HPI Anemia and weakness Patient is coming in today for ER follow-up for anemia and weakness.  She was in the ER for weakness on 10/03/2023 and prior to that she was in the hospital on 09/28/2023 overnight until 09/29/2023, that time was for symptomatic anemia.  She was found to have B12 deficiency and that is causing her symptomatic anemia.  She is still having a lot of anxiety and stresses and worries.  They are wondering if anything can help with that so she does not stress as much.  She also does not sleep as much when she stresses and walks around a lot more. She stays at [facility]  [facility] facility.  She denies any blood in her stool and feels like her energy is better.  Relevant past medical, surgical, family and social history reviewed and updated as indicated. Interim medical history since our last visit reviewed. Allergies and medications reviewed and updated.  Review of Systems  Constitutional:  Positive for fatigue. Negative for chills and fever.  HENT:  Negative for congestion, ear discharge and ear pain.   Eyes:  Negative for redness and visual disturbance.  Respiratory:  Negative for chest tightness and shortness of breath.   Cardiovascular:  Negative for chest pain and leg swelling.  Genitourinary:  Negative for difficulty urinating and dysuria.  Musculoskeletal:  Negative for back pain and gait problem.  Skin:  Negative for rash.  Neurological:  Positive for weakness. Negative for dizziness, light-headedness and headaches.  Psychiatric/Behavioral:  Negative for agitation and behavioral problems. The patient is nervous/anxious.   All other  systems reviewed and are negative.   Per HPI unless specifically indicated above   Allergies as of 10/16/2023       Reactions   Nitrofurantoin    Pt does not remember this medicine   Lisinopril Cough   Sulfonamide Derivatives Rash        Medication List        Accurate as of October 16, 2023  2:34 PM. If you have any questions, ask your nurse or doctor.          STOP taking these medications    hydrOXYzine 25 MG capsule Commonly known as: VISTARIL Stopped by: Elige Radon Shandiin Eisenbeis       TAKE these medications    acetaminophen 500 MG tablet Commonly known as: TYLENOL Take 500 mg by mouth every 6 (six) hours as needed for headache.   amLODipine 2.5 MG tablet Commonly known as: NORVASC Take 3 tablets by mouth daily   anti-nausea solution Take 10 mLs by mouth every 15 (fifteen) minutes as needed for nausea or vomiting.   Benefiber Powd Take 1 Dose by mouth daily.   bismuth subsalicylate 262 MG/15ML suspension Commonly known as: PEPTO BISMOL Take 10 mLs by mouth See admin instructions. Take 10mL every 15 minutes as needed for N/V.   cetirizine 10 MG tablet Commonly known as: ZyrTEC Allergy Take 1 tablet (10 mg total) by mouth daily. As needed   clonazePAM 0.5 MG tablet Commonly known as: KLONOPIN Take 1  tablet (0.5 mg total) by mouth 2 (two) times daily as needed for anxiety (Use for anxiety and insomnia). Started by: Elige Radon Kamaljit Hizer   cyanocobalamin 1000 MCG tablet Take 1 tablet (1,000 mcg total) by mouth daily.   folic acid 1 MG tablet Commonly known as: FOLVITE Take 1 tablet (1 mg total) by mouth daily.   furosemide 40 MG tablet Commonly known as: Lasix Take 1 tablet (40 mg total) by mouth daily as needed for fluid or edema (> Worsening bilateral lower extremity edema/weight gain in 24 hours 3-5 pounds).   hydrochlorothiazide 25 MG tablet Commonly known as: HYDRODIURIL Take 1 tablet (25 mg total) by mouth daily. **NEEDS TO BE SEEN BEFORE NEXT  REFILL**   loperamide 2 MG capsule Commonly known as: IMODIUM Take 2 mg by mouth as needed for diarrhea or loose stools.   meclizine 25 MG tablet Commonly known as: ANTIVERT Take 1 tablet (25 mg total) by mouth 3 (three) times daily as needed for dizziness.   polyethylene glycol 17 g packet Commonly known as: MIRALAX / GLYCOLAX Take 17 g by mouth daily.   potassium chloride 10 MEQ tablet Commonly known as: KLOR-CON M Take 2 tablets (20 mEq total) by mouth daily.   sodium chloride 5 % ophthalmic solution Commonly known as: MURO 128 1 drop 3 (three) times daily. Affected eye(s)   traZODone 50 MG tablet Commonly known as: DESYREL Take 0.5 tablets (25 mg total) by mouth at bedtime as needed for sleep.   Vitamin D3 50 MCG (2000 UT) Tabs Take 2,000 Units by mouth daily.         Objective:   BP (!) 131/52   Pulse 84   Ht 5\' 7"  (1.702 m)   Wt 147 lb (66.7 kg)   SpO2 97%   BMI 23.02 kg/m   Wt Readings from Last 3 Encounters:  10/16/23 147 lb (66.7 kg)  10/03/23 144 lb 10 oz (65.6 kg)  09/29/23 144 lb 10 oz (65.6 kg)    Physical Exam Vitals and nursing note reviewed.  Constitutional:      General: She is not in acute distress.    Appearance: She is well-developed. She is not diaphoretic.  Eyes:     Conjunctiva/sclera: Conjunctivae normal.  Cardiovascular:     Rate and Rhythm: Normal rate and regular rhythm.     Heart sounds: Normal heart sounds. No murmur heard. Pulmonary:     Effort: Pulmonary effort is normal. No respiratory distress.     Breath sounds: Normal breath sounds. No wheezing.  Musculoskeletal:        General: No swelling.  Skin:    General: Skin is warm and dry.     Findings: No rash.  Neurological:     Mental Status: She is alert and oriented to person, place, and time.     Coordination: Coordination normal.  Psychiatric:        Behavior: Behavior normal.       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  Discussed risks with patient and staff.  Will check blood counts today to make sure she is still doing better with anemia.  Follow up plan: Return in about 6 months (around 04/16/2024), or if symptoms worsen or fail to improve, for Recheck anxiety.  Counseling provided for all of the vaccine components Orders Placed This Encounter  Procedures   CBC with Differential/Platelet   CMP14+EGFR    Arville Care, MD [facility] 10/16/2023, 2:34 PM

## 2023-10-17 ENCOUNTER — Other Ambulatory Visit: Payer: Self-pay

## 2023-10-17 ENCOUNTER — Encounter: Payer: Self-pay | Admitting: Family Medicine

## 2023-10-17 DIAGNOSIS — R600 Localized edema: Secondary | ICD-10-CM

## 2023-10-17 DIAGNOSIS — D649 Anemia, unspecified: Secondary | ICD-10-CM

## 2023-10-17 LAB — CBC WITH DIFFERENTIAL/PLATELET
Basophils Absolute: 0.1 10*3/uL (ref 0.0–0.2)
Basos: 1 %
EOS (ABSOLUTE): 0.1 10*3/uL (ref 0.0–0.4)
Eos: 2 %
Hematocrit: 25 % — ABNORMAL LOW (ref 34.0–46.6)
Hemoglobin: 9.4 g/dL — ABNORMAL LOW (ref 11.1–15.9)
Immature Grans (Abs): 0 10*3/uL (ref 0.0–0.1)
Immature Granulocytes: 0 %
Lymphocytes Absolute: 0.6 10*3/uL — ABNORMAL LOW (ref 0.7–3.1)
Lymphs: 11 %
MCH: 39.2 pg — ABNORMAL HIGH (ref 26.6–33.0)
MCHC: 37.6 g/dL — ABNORMAL HIGH (ref 31.5–35.7)
MCV: 104 fL — ABNORMAL HIGH (ref 79–97)
Monocytes Absolute: 0.4 10*3/uL (ref 0.1–0.9)
Monocytes: 8 %
Neutrophils Absolute: 3.9 10*3/uL (ref 1.4–7.0)
Neutrophils: 78 %
Platelets: 308 10*3/uL (ref 150–450)
RBC: 2.4 x10E6/uL — CL (ref 3.77–5.28)
RDW: 16.6 % — ABNORMAL HIGH (ref 11.7–15.4)
WBC: 5.1 10*3/uL (ref 3.4–10.8)

## 2023-10-17 LAB — CMP14+EGFR
ALT: 10 IU/L (ref 0–32)
AST: 18 IU/L (ref 0–40)
Albumin: 3.6 g/dL — ABNORMAL LOW (ref 3.7–4.7)
Alkaline Phosphatase: 63 IU/L (ref 44–121)
BUN/Creatinine Ratio: 21 (ref 12–28)
BUN: 19 mg/dL (ref 8–27)
Bilirubin Total: 0.4 mg/dL (ref 0.0–1.2)
CO2: 23 mmol/L (ref 20–29)
Calcium: 8.9 mg/dL (ref 8.7–10.3)
Chloride: 103 mmol/L (ref 96–106)
Creatinine, Ser: 0.91 mg/dL (ref 0.57–1.00)
Globulin, Total: 1.9 g/dL (ref 1.5–4.5)
Glucose: 123 mg/dL — ABNORMAL HIGH (ref 70–99)
Potassium: 4 mmol/L (ref 3.5–5.2)
Sodium: 142 mmol/L (ref 134–144)
Total Protein: 5.5 g/dL — ABNORMAL LOW (ref 6.0–8.5)
eGFR: 61 mL/min/{1.73_m2} (ref >59)

## 2023-10-20 DIAGNOSIS — I10 Essential (primary) hypertension: Secondary | ICD-10-CM | POA: Diagnosis not present

## 2023-10-20 DIAGNOSIS — E538 Deficiency of other specified B group vitamins: Secondary | ICD-10-CM | POA: Diagnosis not present

## 2023-10-20 DIAGNOSIS — D61818 Other pancytopenia: Secondary | ICD-10-CM | POA: Diagnosis not present

## 2023-10-20 DIAGNOSIS — E876 Hypokalemia: Secondary | ICD-10-CM | POA: Diagnosis not present

## 2023-10-20 DIAGNOSIS — E559 Vitamin D deficiency, unspecified: Secondary | ICD-10-CM | POA: Diagnosis not present

## 2023-10-20 DIAGNOSIS — K219 Gastro-esophageal reflux disease without esophagitis: Secondary | ICD-10-CM | POA: Diagnosis not present

## 2023-10-20 DIAGNOSIS — I080 Rheumatic disorders of both mitral and aortic valves: Secondary | ICD-10-CM | POA: Diagnosis not present

## 2023-10-20 DIAGNOSIS — Z556 Problems related to health literacy: Secondary | ICD-10-CM | POA: Diagnosis not present

## 2023-10-20 DIAGNOSIS — K589 Irritable bowel syndrome without diarrhea: Secondary | ICD-10-CM | POA: Diagnosis not present

## 2023-10-20 DIAGNOSIS — I6529 Occlusion and stenosis of unspecified carotid artery: Secondary | ICD-10-CM | POA: Diagnosis not present

## 2023-10-20 DIAGNOSIS — D649 Anemia, unspecified: Secondary | ICD-10-CM | POA: Diagnosis not present

## 2023-10-20 DIAGNOSIS — M8580 Other specified disorders of bone density and structure, unspecified site: Secondary | ICD-10-CM | POA: Diagnosis not present

## 2023-10-21 ENCOUNTER — Ambulatory Visit

## 2023-10-21 DIAGNOSIS — R278 Other lack of coordination: Secondary | ICD-10-CM | POA: Diagnosis not present

## 2023-10-21 DIAGNOSIS — M6281 Muscle weakness (generalized): Secondary | ICD-10-CM | POA: Diagnosis not present

## 2023-10-21 DIAGNOSIS — I10 Essential (primary) hypertension: Secondary | ICD-10-CM | POA: Diagnosis not present

## 2023-10-22 ENCOUNTER — Ambulatory Visit

## 2023-10-22 DIAGNOSIS — D649 Anemia, unspecified: Secondary | ICD-10-CM | POA: Diagnosis not present

## 2023-10-22 DIAGNOSIS — M8580 Other specified disorders of bone density and structure, unspecified site: Secondary | ICD-10-CM | POA: Diagnosis not present

## 2023-10-22 DIAGNOSIS — F03918 Unspecified dementia, unspecified severity, with other behavioral disturbance: Secondary | ICD-10-CM

## 2023-10-22 DIAGNOSIS — I6529 Occlusion and stenosis of unspecified carotid artery: Secondary | ICD-10-CM | POA: Diagnosis not present

## 2023-10-22 DIAGNOSIS — I10 Essential (primary) hypertension: Secondary | ICD-10-CM | POA: Diagnosis not present

## 2023-10-22 DIAGNOSIS — K219 Gastro-esophageal reflux disease without esophagitis: Secondary | ICD-10-CM | POA: Diagnosis not present

## 2023-10-22 DIAGNOSIS — F05 Delirium due to known physiological condition: Secondary | ICD-10-CM | POA: Diagnosis not present

## 2023-10-22 DIAGNOSIS — K589 Irritable bowel syndrome without diarrhea: Secondary | ICD-10-CM | POA: Diagnosis not present

## 2023-10-22 DIAGNOSIS — D61818 Other pancytopenia: Secondary | ICD-10-CM

## 2023-10-22 DIAGNOSIS — E559 Vitamin D deficiency, unspecified: Secondary | ICD-10-CM | POA: Diagnosis not present

## 2023-10-22 DIAGNOSIS — I080 Rheumatic disorders of both mitral and aortic valves: Secondary | ICD-10-CM | POA: Diagnosis not present

## 2023-10-22 DIAGNOSIS — E538 Deficiency of other specified B group vitamins: Secondary | ICD-10-CM | POA: Diagnosis not present

## 2023-10-23 DIAGNOSIS — R278 Other lack of coordination: Secondary | ICD-10-CM | POA: Diagnosis not present

## 2023-10-23 DIAGNOSIS — I10 Essential (primary) hypertension: Secondary | ICD-10-CM | POA: Diagnosis not present

## 2023-10-23 DIAGNOSIS — M6281 Muscle weakness (generalized): Secondary | ICD-10-CM | POA: Diagnosis not present

## 2023-10-28 ENCOUNTER — Telehealth: Payer: Self-pay

## 2023-10-28 DIAGNOSIS — M6281 Muscle weakness (generalized): Secondary | ICD-10-CM | POA: Diagnosis not present

## 2023-10-28 DIAGNOSIS — I10 Essential (primary) hypertension: Secondary | ICD-10-CM | POA: Diagnosis not present

## 2023-10-28 DIAGNOSIS — R278 Other lack of coordination: Secondary | ICD-10-CM | POA: Diagnosis not present

## 2023-10-30 DIAGNOSIS — I10 Essential (primary) hypertension: Secondary | ICD-10-CM | POA: Diagnosis not present

## 2023-10-30 DIAGNOSIS — M6281 Muscle weakness (generalized): Secondary | ICD-10-CM | POA: Diagnosis not present

## 2023-10-30 DIAGNOSIS — R278 Other lack of coordination: Secondary | ICD-10-CM | POA: Diagnosis not present

## 2023-11-06 DIAGNOSIS — J302 Other seasonal allergic rhinitis: Secondary | ICD-10-CM | POA: Diagnosis not present

## 2023-11-06 DIAGNOSIS — E538 Deficiency of other specified B group vitamins: Secondary | ICD-10-CM | POA: Diagnosis not present

## 2023-11-06 DIAGNOSIS — E559 Vitamin D deficiency, unspecified: Secondary | ICD-10-CM | POA: Diagnosis not present

## 2023-11-06 DIAGNOSIS — I10 Essential (primary) hypertension: Secondary | ICD-10-CM | POA: Diagnosis not present

## 2023-11-06 DIAGNOSIS — K5901 Slow transit constipation: Secondary | ICD-10-CM | POA: Diagnosis not present

## 2023-11-06 DIAGNOSIS — I6529 Occlusion and stenosis of unspecified carotid artery: Secondary | ICD-10-CM | POA: Diagnosis not present

## 2023-11-06 DIAGNOSIS — M8580 Other specified disorders of bone density and structure, unspecified site: Secondary | ICD-10-CM | POA: Diagnosis not present

## 2023-11-06 DIAGNOSIS — I359 Nonrheumatic aortic valve disorder, unspecified: Secondary | ICD-10-CM | POA: Diagnosis not present

## 2023-11-09 DIAGNOSIS — R300 Dysuria: Secondary | ICD-10-CM | POA: Diagnosis not present

## 2023-11-13 ENCOUNTER — Telehealth: Payer: Self-pay | Admitting: Cardiology

## 2023-11-13 DIAGNOSIS — E559 Vitamin D deficiency, unspecified: Secondary | ICD-10-CM | POA: Diagnosis not present

## 2023-11-13 DIAGNOSIS — J302 Other seasonal allergic rhinitis: Secondary | ICD-10-CM | POA: Diagnosis not present

## 2023-11-13 DIAGNOSIS — I6529 Occlusion and stenosis of unspecified carotid artery: Secondary | ICD-10-CM | POA: Diagnosis not present

## 2023-11-13 DIAGNOSIS — I359 Nonrheumatic aortic valve disorder, unspecified: Secondary | ICD-10-CM | POA: Diagnosis not present

## 2023-11-13 DIAGNOSIS — M8580 Other specified disorders of bone density and structure, unspecified site: Secondary | ICD-10-CM | POA: Diagnosis not present

## 2023-11-13 DIAGNOSIS — E538 Deficiency of other specified B group vitamins: Secondary | ICD-10-CM | POA: Diagnosis not present

## 2023-11-13 DIAGNOSIS — K5901 Slow transit constipation: Secondary | ICD-10-CM | POA: Diagnosis not present

## 2023-11-13 DIAGNOSIS — I10 Essential (primary) hypertension: Secondary | ICD-10-CM | POA: Diagnosis not present

## 2023-11-13 NOTE — Telephone Encounter (Signed)
 Spoke w pts daughter to sch apt- pt is in an assisted living facility due to having dementia, pt will not be able to come in for apts

## 2023-11-21 DIAGNOSIS — R011 Cardiac murmur, unspecified: Secondary | ICD-10-CM | POA: Diagnosis not present

## 2023-11-27 NOTE — Telephone Encounter (Signed)
 fyi

## 2023-12-04 DIAGNOSIS — K5901 Slow transit constipation: Secondary | ICD-10-CM | POA: Diagnosis not present

## 2023-12-04 DIAGNOSIS — I6529 Occlusion and stenosis of unspecified carotid artery: Secondary | ICD-10-CM | POA: Diagnosis not present

## 2023-12-04 DIAGNOSIS — J302 Other seasonal allergic rhinitis: Secondary | ICD-10-CM | POA: Diagnosis not present

## 2023-12-04 DIAGNOSIS — M8580 Other specified disorders of bone density and structure, unspecified site: Secondary | ICD-10-CM | POA: Diagnosis not present

## 2023-12-04 DIAGNOSIS — E559 Vitamin D deficiency, unspecified: Secondary | ICD-10-CM | POA: Diagnosis not present

## 2023-12-04 DIAGNOSIS — I10 Essential (primary) hypertension: Secondary | ICD-10-CM | POA: Diagnosis not present

## 2023-12-04 DIAGNOSIS — I359 Nonrheumatic aortic valve disorder, unspecified: Secondary | ICD-10-CM | POA: Diagnosis not present

## 2023-12-04 DIAGNOSIS — E538 Deficiency of other specified B group vitamins: Secondary | ICD-10-CM | POA: Diagnosis not present

## 2023-12-08 DIAGNOSIS — I10 Essential (primary) hypertension: Secondary | ICD-10-CM | POA: Diagnosis not present

## 2023-12-08 DIAGNOSIS — E559 Vitamin D deficiency, unspecified: Secondary | ICD-10-CM | POA: Diagnosis not present

## 2023-12-18 DIAGNOSIS — I359 Nonrheumatic aortic valve disorder, unspecified: Secondary | ICD-10-CM | POA: Diagnosis not present

## 2023-12-18 DIAGNOSIS — I6529 Occlusion and stenosis of unspecified carotid artery: Secondary | ICD-10-CM | POA: Diagnosis not present

## 2023-12-18 DIAGNOSIS — M8580 Other specified disorders of bone density and structure, unspecified site: Secondary | ICD-10-CM | POA: Diagnosis not present

## 2023-12-18 DIAGNOSIS — E559 Vitamin D deficiency, unspecified: Secondary | ICD-10-CM | POA: Diagnosis not present

## 2023-12-18 DIAGNOSIS — E538 Deficiency of other specified B group vitamins: Secondary | ICD-10-CM | POA: Diagnosis not present

## 2023-12-18 DIAGNOSIS — I10 Essential (primary) hypertension: Secondary | ICD-10-CM | POA: Diagnosis not present

## 2023-12-18 DIAGNOSIS — K5901 Slow transit constipation: Secondary | ICD-10-CM | POA: Diagnosis not present

## 2023-12-20 DIAGNOSIS — I359 Nonrheumatic aortic valve disorder, unspecified: Secondary | ICD-10-CM | POA: Diagnosis not present

## 2023-12-20 DIAGNOSIS — E538 Deficiency of other specified B group vitamins: Secondary | ICD-10-CM | POA: Diagnosis not present

## 2023-12-20 DIAGNOSIS — I10 Essential (primary) hypertension: Secondary | ICD-10-CM | POA: Diagnosis not present

## 2023-12-20 DIAGNOSIS — I6529 Occlusion and stenosis of unspecified carotid artery: Secondary | ICD-10-CM | POA: Diagnosis not present

## 2023-12-25 DIAGNOSIS — Z760 Encounter for issue of repeat prescription: Secondary | ICD-10-CM | POA: Diagnosis not present

## 2023-12-31 DIAGNOSIS — I10 Essential (primary) hypertension: Secondary | ICD-10-CM | POA: Diagnosis not present

## 2023-12-31 DIAGNOSIS — E559 Vitamin D deficiency, unspecified: Secondary | ICD-10-CM | POA: Diagnosis not present

## 2023-12-31 DIAGNOSIS — E782 Mixed hyperlipidemia: Secondary | ICD-10-CM | POA: Diagnosis not present

## 2023-12-31 DIAGNOSIS — E038 Other specified hypothyroidism: Secondary | ICD-10-CM | POA: Diagnosis not present

## 2023-12-31 DIAGNOSIS — D519 Vitamin B12 deficiency anemia, unspecified: Secondary | ICD-10-CM | POA: Diagnosis not present

## 2024-01-01 DIAGNOSIS — E559 Vitamin D deficiency, unspecified: Secondary | ICD-10-CM | POA: Diagnosis not present

## 2024-01-01 DIAGNOSIS — I359 Nonrheumatic aortic valve disorder, unspecified: Secondary | ICD-10-CM | POA: Diagnosis not present

## 2024-01-01 DIAGNOSIS — I6529 Occlusion and stenosis of unspecified carotid artery: Secondary | ICD-10-CM | POA: Diagnosis not present

## 2024-01-01 DIAGNOSIS — E538 Deficiency of other specified B group vitamins: Secondary | ICD-10-CM | POA: Diagnosis not present

## 2024-01-01 DIAGNOSIS — M8580 Other specified disorders of bone density and structure, unspecified site: Secondary | ICD-10-CM | POA: Diagnosis not present

## 2024-01-01 DIAGNOSIS — J302 Other seasonal allergic rhinitis: Secondary | ICD-10-CM | POA: Diagnosis not present

## 2024-01-01 DIAGNOSIS — K5901 Slow transit constipation: Secondary | ICD-10-CM | POA: Diagnosis not present

## 2024-01-06 DIAGNOSIS — Z79899 Other long term (current) drug therapy: Secondary | ICD-10-CM | POA: Diagnosis not present

## 2024-01-06 DIAGNOSIS — E559 Vitamin D deficiency, unspecified: Secondary | ICD-10-CM | POA: Diagnosis not present

## 2024-01-06 DIAGNOSIS — D519 Vitamin B12 deficiency anemia, unspecified: Secondary | ICD-10-CM | POA: Diagnosis not present

## 2024-01-06 DIAGNOSIS — E782 Mixed hyperlipidemia: Secondary | ICD-10-CM | POA: Diagnosis not present

## 2024-01-06 DIAGNOSIS — R7309 Other abnormal glucose: Secondary | ICD-10-CM | POA: Diagnosis not present

## 2024-01-06 DIAGNOSIS — E038 Other specified hypothyroidism: Secondary | ICD-10-CM | POA: Diagnosis not present

## 2024-02-10 DIAGNOSIS — M79671 Pain in right foot: Secondary | ICD-10-CM | POA: Diagnosis not present

## 2024-02-10 DIAGNOSIS — M2040 Other hammer toe(s) (acquired), unspecified foot: Secondary | ICD-10-CM | POA: Diagnosis not present

## 2024-02-10 DIAGNOSIS — L600 Ingrowing nail: Secondary | ICD-10-CM | POA: Diagnosis not present

## 2024-02-10 DIAGNOSIS — B351 Tinea unguium: Secondary | ICD-10-CM | POA: Diagnosis not present

## 2024-02-10 DIAGNOSIS — M79672 Pain in left foot: Secondary | ICD-10-CM | POA: Diagnosis not present

## 2024-02-10 DIAGNOSIS — I872 Venous insufficiency (chronic) (peripheral): Secondary | ICD-10-CM | POA: Diagnosis not present

## 2024-02-10 DIAGNOSIS — I739 Peripheral vascular disease, unspecified: Secondary | ICD-10-CM | POA: Diagnosis not present

## 2024-02-10 DIAGNOSIS — I83893 Varicose veins of bilateral lower extremities with other complications: Secondary | ICD-10-CM | POA: Diagnosis not present

## 2024-02-10 DIAGNOSIS — M205X9 Other deformities of toe(s) (acquired), unspecified foot: Secondary | ICD-10-CM | POA: Diagnosis not present

## 2024-04-16 ENCOUNTER — Ambulatory Visit: Admitting: Family Medicine

## 2024-04-16 DIAGNOSIS — G301 Alzheimer's disease with late onset: Secondary | ICD-10-CM

## 2024-04-16 DIAGNOSIS — F419 Anxiety disorder, unspecified: Secondary | ICD-10-CM

## 2024-04-16 DIAGNOSIS — I10 Essential (primary) hypertension: Secondary | ICD-10-CM

## 2024-04-16 DIAGNOSIS — F5104 Psychophysiologic insomnia: Secondary | ICD-10-CM

## 2024-04-19 ENCOUNTER — Encounter: Payer: Self-pay | Admitting: Family Medicine
